# Patient Record
Sex: Male | Born: 1950 | Race: White | Hispanic: No | Smoking: Former smoker
Health system: Southern US, Community
[De-identification: ages and names within clinical notes are randomized; demographics above are authoritative.]

## PROBLEM LIST (undated history)

## (undated) DIAGNOSIS — I10 Essential (primary) hypertension: Secondary | ICD-10-CM

## (undated) DIAGNOSIS — C679 Malignant neoplasm of bladder, unspecified: Secondary | ICD-10-CM

## (undated) DIAGNOSIS — N329 Bladder disorder, unspecified: Secondary | ICD-10-CM

## (undated) DIAGNOSIS — Z87442 Personal history of urinary calculi: Secondary | ICD-10-CM

## (undated) DIAGNOSIS — I639 Cerebral infarction, unspecified: Secondary | ICD-10-CM

## (undated) DIAGNOSIS — R300 Dysuria: Secondary | ICD-10-CM

## (undated) DIAGNOSIS — J189 Pneumonia, unspecified organism: Secondary | ICD-10-CM

## (undated) DIAGNOSIS — Z9889 Other specified postprocedural states: Secondary | ICD-10-CM

## (undated) DIAGNOSIS — Z973 Presence of spectacles and contact lenses: Secondary | ICD-10-CM

## (undated) DIAGNOSIS — M1990 Unspecified osteoarthritis, unspecified site: Secondary | ICD-10-CM

## (undated) DIAGNOSIS — R112 Nausea with vomiting, unspecified: Secondary | ICD-10-CM

## (undated) HISTORY — PX: OTHER SURGICAL HISTORY: SHX169

## (undated) HISTORY — PX: 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: SHX5627

---
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
## 2006-03-25 ENCOUNTER — Ambulatory Visit (HOSPITAL_BASED_OUTPATIENT_CLINIC_OR_DEPARTMENT_OTHER): Admission: RE | Admit: 2006-03-25 | Discharge: 2006-03-25 | Payer: Self-pay | Admitting: Orthopedic Surgery

## 2006-07-07 ENCOUNTER — Ambulatory Visit (HOSPITAL_BASED_OUTPATIENT_CLINIC_OR_DEPARTMENT_OTHER): Admission: RE | Admit: 2006-07-07 | Discharge: 2006-07-07 | Payer: Self-pay | Admitting: Orthopedic Surgery

## 2007-10-25 HISTORY — PX: COLONOSCOPY: SHX174

## 2010-02-10 DIAGNOSIS — Z87442 Personal history of urinary calculi: Secondary | ICD-10-CM

## 2010-02-10 HISTORY — DX: Personal history of urinary calculi: Z87.442

## 2010-06-25 NOTE — Op Note (Signed)
NAME:  Shaun Ball, Shaun Ball NO.:  [id number]   MEDICAL RECORD NO.:  [id number]          PATIENT TYPE:  AMB   LOCATION:  DSC                          FACILITY:  [facility]   PHYSICIAN:  Leonides Grills, M.D.     DATE OF BIRTH:  10-10-1950   DATE OF PROCEDURE:  07/07/2006  DATE OF DISCHARGE:                               OPERATIVE REPORT   PREOPERATIVE DIAGNOSES:  1. Right Haglund deformity.  2. Right tight gastroc.  3. Right Achilles tendon rupture.   POSTOPERATIVE DIAGNOSES:  1. Right Haglund deformity.  2. Right tight gastroc.  3. Right Achilles tendon rupture.   OPERATION:  1. Excision right Haglund deformity.  2. Right gastroc slide.  3. Right primary repair Achilles tendon.   ANESTHESIA:  General.   SURGEON:  Leonides Grills, MD   ASSISTANT:  Evlyn Kanner, P.A.   ESTIMATED BLOOD LOSS:  Minimal.   TOURNIQUET TIME:  Approximately an hour 20 minutes.   COMPLICATIONS:  None.   DISPOSITION:  Stable to PR.   INDICATIONS:  This is 60 year old male who has had prolonged posterior  right heel pain that is interfering with his life to point that he  cannot do what he wants to do.  He was consented to the above procedure.  All risks which include infection, nerve or vessel injury, Achilles  tendon rupture, persistent pain, worsening pain, prolonged recovery,  weakness, stiffness were all explained.  Questions were encouraged and  answered.   Patient brought to operating room placed in supine position, after  adequate general endotracheal tube anesthesia was administered as well  as Ancef gram IV piggyback.  The patient was then placed in a prone  position.  All bony prominences well padded bolsters chest bolsters were  placed. The right lower extremity is prepped and draped in a sterile  manner over proximally placed thigh tourniquet.  Limb was gravity  exsanguinated.  Tourniquet elevated to 290 mmHg.  A longitudinal  incision on the medial aspect gastrocnemius  muscle tendinous junction  was then made.  Dissection was carried down through skin.  Hemostasis  was obtained.  Fascia was opened line with the incision, conjoined  region was then developed between the gastroc and soleus.  Soft tissue  was elevated off the posterior aspect gastrocnemius. Gastrocnemius was  then isolated, sural nerve was protected posteriorly and the  gastrocnemius then released with a curved Mayo scissors.  This had an  excellent release tight gastroc.  Area was copiously normal saline.  Subcu was closed 3-0 Vicryl.  Skin was closed 4-0 Monocryl subcuticular  stitch.  Steri-Strips were applied and we made an incision on the  anteromedial, anterolateral aspects of Achilles tendon.  Dissection was  carried down directly to bone.  Soft tissue was elevated off the  calcaneal tuber and the retrocalcaneal area. Retrocalcaneal bursa was  removed.  The calcification within the Achilles tendon was carefully  dissected out sharply and with a sagittal saw both the calcification  within the Achilles tendon as well as the Haglund deformity were  removed.  Because of the extent of the degeneration  of the Achilles  tendon and the fact that the calcification of Achilles tendon caused a  degeneration of the insertion Achilles tendon a primary repair the  Achilles tendon was performed as well.  We used to 5.5 mm absorbable  suture anchors with #2 FiberWire. These were placed at the base of the  Haglund resection and the Achilles was repaired to both suture anchors.  We then used a rotator cuff type repair technique crisscrossing the  suture anchors and then fixing this with a sure lock knotless suture  technique.  This was done through small incision Achilles tendon on  either side and two Sure locks were used, this had outstanding repair.  Prior to this Achilles tendon and area were copiously irrigated with  normal saline.  The ankle was ranged and there was excellent repair of  the  Achilles tendon and excellent tension on Achilles tendon as well.  We then repaired the outer edges of the Achilles tendon with 2-0 Vicryl.  There is no palpable stitches through skin and the prominence posterior  was completely decompressed.  Tourniquet deflated.  Hemostasis was  obtained.  Skin was closed 4-0 nylon stitch.  Sterile dressing was  applied.  Modified Jones dressing was applied with the ankle in gravity  equinus.  Patient stable to PR.   POSTOP COURSE:  The patient follow-up in 2 weeks.  At that time remove  the Jones dressing as well as suture.  Will placement short-leg gravity  equinus nonweightbearing cast.  He will wear for 4 weeks.  At 6 weeks  postop he will follow up. Will remove the cast and place him in a Cam  walker boot with a 9/16 inch heel lift.  He will then undergo  progressive weightbearing program in a Cam walker boot full  weightbearing and start therapy for active passive range of motion  excise the ankle talar joint with Thera-Band strengthening as well.  At  10 weeks postop he will go into a normal shoe with the 9/16 inch heel  lift which he will wear for 1-2 months.  He will undergo more aggressive  proprioception strengthening at that time.  Elevation active range of  motion toes encouraged.      Leonides Grills, M.D.  Electronically Signed     PB/MEDQ  D:  07/07/2006  T:  07/07/2006  Job:  [id number]

## 2010-06-28 NOTE — Op Note (Signed)
NAME:  VENSON, Shaun Ball NO.:  [id number]   MEDICAL RECORD NO.:  [id number]          PATIENT TYPE:  AMB   LOCATION:  DSC                          FACILITY:  [facility]   PHYSICIAN:  Leonides Grills, M.D.     DATE OF BIRTH:  [birth year]   DATE OF PROCEDURE:  03/25/2006  DATE OF DISCHARGE:                               OPERATIVE REPORT   PREOPERATIVE DIAGNOSES:  1. Left Haglund's deformity.  2. Left calcification Achilles tendon.  3. Left tight gastroc.   POSTOPERATIVE DIAGNOSES:  1. Left Haglund's deformity.  2. Left calcification Achilles tendon.  3. Left tight gastroc.   OPERATION:  1. Left excision Haglund's deformity.  2. Left gastroc slide.  3. Left excision of calcification within the Achilles tendon.  4. Left primary repair Achilles tendon.   ANESTHESIA:  General.   SURGEON:  Leonides Grills, M.D.   ASSISTANT:  Evlyn Kanner, P.A.-C.   ESTIMATED BLOOD LOSS:  Minimal.   TOURNIQUET TIME:  Approximately one hour.   COMPLICATIONS:  None.   DISPOSITION:  Stable to PR.   INDICATIONS:  This is a 60 year old gentleman who has a longstanding  posterior left heel pain that was interfering with his life.  He could  not do what he wanted to do.  He was consented for the above procedure.  All risks which include infection, nerve or vessel injury, persistent  pain, worst pain, Achilles tendon rupture, prolonged recovery were all  explained.  Questions were encouraged and answered.   DESCRIPTION OF PROCEDURE:  The patient brought to the operating room and  placed in the supine position.  Initially after adequate general G tube  anesthesia was administered as well as Ancef per IV piggyback.  The  patient was then placed in a prone position.  All bony prominences and  chest well padded.  The left lower extremity was then prepped and draped  in a sterile manner over a proximally placed thigh tourniquet.  We  started the procedure with a longitudinal incision over the  medial  aspect of the gastrocnemius muscle tendinous junction.  Dissection was  carried down through skin.  Hemostasis was obtained.  Fascia was opened  in line with the incision.  Conjoined region was then developed between  the gastroc and soleus musculature.  The sural nerve was identified and  elevated off the posterior aspect of the gastrocnemius and protected  throughout the case.  Gastrocnemius then released with a curved Mayo  scissors.  This had an excellent release of the tight gastroc.  The area  was copiously irrigated with normal saline.  Subcu was closed with 4-0  Monocryl subcuticular stitch.  Steri-Strips were applied.  We then  gravity exsanguinated the left lower extremity and tourniquet was  elevated 290 mmHg.  A longitudinal incision on either side of the  Achilles tendon at the anterior border both medially and laterally was  then made.  Dissection was carried down through skin.  Hemostasis was  obtained.  Dissection was carried on the anterior aspect of the Achilles  tendon and Achilles tendon was dissected off the large  bony prominence  posterolaterally.  This extended completely across the Achilles  insertional area.  This was then removed with a sagittal saw as well as  a rongeur and curved corners osteotome.  Once this was completely  debrided on either side of the joint, Haglund's deformity was then  removed with the sagittal saw as well.  Once this was done, the area was  copiously irrigated with normal saline.  Because of the Achilles tendon  tendinopathy, the Achilles tendon was then repaired back down to the  exposed bone with two 5.5 mm [employer] bioabsorbable suture anchors with  #2 FiberWire.  Once this was done, we then used a crisscross rotator  cuff repair suture technique which the sutures were passed subcu on the  posterior aspect of the Achilles.  We then placed two SureLock suture  anchors with a knotless technique.  This had an outstanding not  only  purchase through the drill hole with percutaneous incision in the  Achilles tendon, but also compressed the Achilles tendon down  beautifully as well.  Sutures were removed.  Tourniquet was deflated.  Hemostasis was obtained.  The area was copiously irrigated with normal  saline.  Skin was closed 4-0 nylon stitch.  Sterile dressing was  applied.  Modified Jones dressing was applied in gravity equinus.  Patient stable to PR.      Leonides Grills, M.D.  Electronically Signed     PB/MEDQ  D:  03/25/2006  T:  03/25/2006  Job:  [id number]

## 2014-07-11 DIAGNOSIS — H40013 Open angle with borderline findings, low risk, bilateral: Secondary | ICD-10-CM | POA: Diagnosis not present

## 2014-07-11 DIAGNOSIS — H3589 Other specified retinal disorders: Secondary | ICD-10-CM | POA: Diagnosis not present

## 2014-07-11 DIAGNOSIS — H11153 Pinguecula, bilateral: Secondary | ICD-10-CM | POA: Diagnosis not present

## 2014-07-11 DIAGNOSIS — H40053 Ocular hypertension, bilateral: Secondary | ICD-10-CM | POA: Diagnosis not present

## 2014-07-11 DIAGNOSIS — H18413 Arcus senilis, bilateral: Secondary | ICD-10-CM | POA: Diagnosis not present

## 2014-07-11 DIAGNOSIS — H2513 Age-related nuclear cataract, bilateral: Secondary | ICD-10-CM | POA: Diagnosis not present

## 2014-07-11 DIAGNOSIS — H40023 Open angle with borderline findings, high risk, bilateral: Secondary | ICD-10-CM | POA: Diagnosis not present

## 2014-07-11 DIAGNOSIS — H1045 Other chronic allergic conjunctivitis: Secondary | ICD-10-CM | POA: Diagnosis not present

## 2014-07-18 DIAGNOSIS — I10 Essential (primary) hypertension: Secondary | ICD-10-CM | POA: Diagnosis not present

## 2014-07-18 DIAGNOSIS — Z125 Encounter for screening for malignant neoplasm of prostate: Secondary | ICD-10-CM | POA: Diagnosis not present

## 2014-07-18 DIAGNOSIS — E785 Hyperlipidemia, unspecified: Secondary | ICD-10-CM | POA: Diagnosis not present

## 2014-07-25 DIAGNOSIS — H40013 Open angle with borderline findings, low risk, bilateral: Secondary | ICD-10-CM | POA: Diagnosis not present

## 2014-07-25 DIAGNOSIS — H40003 Preglaucoma, unspecified, bilateral: Secondary | ICD-10-CM | POA: Diagnosis not present

## 2014-07-25 DIAGNOSIS — H1045 Other chronic allergic conjunctivitis: Secondary | ICD-10-CM | POA: Diagnosis not present

## 2014-07-25 DIAGNOSIS — H18413 Arcus senilis, bilateral: Secondary | ICD-10-CM | POA: Diagnosis not present

## 2014-07-25 DIAGNOSIS — H11153 Pinguecula, bilateral: Secondary | ICD-10-CM | POA: Diagnosis not present

## 2014-07-25 DIAGNOSIS — H2513 Age-related nuclear cataract, bilateral: Secondary | ICD-10-CM | POA: Diagnosis not present

## 2014-08-09 DIAGNOSIS — R740 Nonspecific elevation of levels of transaminase and lactic acid dehydrogenase [LDH]: Secondary | ICD-10-CM | POA: Diagnosis not present

## 2014-08-09 DIAGNOSIS — Z6826 Body mass index (BMI) 26.0-26.9, adult: Secondary | ICD-10-CM | POA: Diagnosis not present

## 2014-08-09 DIAGNOSIS — M545 Low back pain: Secondary | ICD-10-CM | POA: Diagnosis not present

## 2014-08-09 DIAGNOSIS — Z833 Family history of diabetes mellitus: Secondary | ICD-10-CM | POA: Diagnosis not present

## 2014-08-09 DIAGNOSIS — N401 Enlarged prostate with lower urinary tract symptoms: Secondary | ICD-10-CM | POA: Diagnosis not present

## 2014-08-09 DIAGNOSIS — E785 Hyperlipidemia, unspecified: Secondary | ICD-10-CM | POA: Diagnosis not present

## 2014-08-09 DIAGNOSIS — Z1389 Encounter for screening for other disorder: Secondary | ICD-10-CM | POA: Diagnosis not present

## 2014-08-09 DIAGNOSIS — N200 Calculus of kidney: Secondary | ICD-10-CM | POA: Diagnosis not present

## 2014-08-09 DIAGNOSIS — Z0000 Encounter for general adult medical examination without abnormal findings: Secondary | ICD-10-CM | POA: Diagnosis not present

## 2014-08-09 DIAGNOSIS — I10 Essential (primary) hypertension: Secondary | ICD-10-CM | POA: Diagnosis not present

## 2014-08-10 DIAGNOSIS — Z1212 Encounter for screening for malignant neoplasm of rectum: Secondary | ICD-10-CM | POA: Diagnosis not present

## 2014-09-28 DIAGNOSIS — M545 Low back pain: Secondary | ICD-10-CM | POA: Diagnosis not present

## 2014-09-28 DIAGNOSIS — I10 Essential (primary) hypertension: Secondary | ICD-10-CM | POA: Diagnosis not present

## 2014-09-28 DIAGNOSIS — Z6826 Body mass index (BMI) 26.0-26.9, adult: Secondary | ICD-10-CM | POA: Diagnosis not present

## 2014-11-24 DIAGNOSIS — Z23 Encounter for immunization: Secondary | ICD-10-CM | POA: Diagnosis not present

## 2015-06-29 DIAGNOSIS — H11153 Pinguecula, bilateral: Secondary | ICD-10-CM | POA: Diagnosis not present

## 2015-06-29 DIAGNOSIS — H35411 Lattice degeneration of retina, right eye: Secondary | ICD-10-CM | POA: Diagnosis not present

## 2015-06-29 DIAGNOSIS — H5202 Hypermetropia, left eye: Secondary | ICD-10-CM | POA: Diagnosis not present

## 2015-06-29 DIAGNOSIS — H18413 Arcus senilis, bilateral: Secondary | ICD-10-CM | POA: Diagnosis not present

## 2015-06-29 DIAGNOSIS — H25013 Cortical age-related cataract, bilateral: Secondary | ICD-10-CM | POA: Diagnosis not present

## 2015-06-29 DIAGNOSIS — H11423 Conjunctival edema, bilateral: Secondary | ICD-10-CM | POA: Diagnosis not present

## 2015-06-29 DIAGNOSIS — H2513 Age-related nuclear cataract, bilateral: Secondary | ICD-10-CM | POA: Diagnosis not present

## 2015-06-29 DIAGNOSIS — H40013 Open angle with borderline findings, low risk, bilateral: Secondary | ICD-10-CM | POA: Diagnosis not present

## 2015-06-29 DIAGNOSIS — H35033 Hypertensive retinopathy, bilateral: Secondary | ICD-10-CM | POA: Diagnosis not present

## 2015-06-29 DIAGNOSIS — H25043 Posterior subcapsular polar age-related cataract, bilateral: Secondary | ICD-10-CM | POA: Diagnosis not present

## 2015-06-29 DIAGNOSIS — H40003 Preglaucoma, unspecified, bilateral: Secondary | ICD-10-CM | POA: Diagnosis not present

## 2015-06-29 DIAGNOSIS — H35431 Paving stone degeneration of retina, right eye: Secondary | ICD-10-CM | POA: Diagnosis not present

## 2015-08-28 DIAGNOSIS — E784 Other hyperlipidemia: Secondary | ICD-10-CM | POA: Diagnosis not present

## 2015-08-28 DIAGNOSIS — Z125 Encounter for screening for malignant neoplasm of prostate: Secondary | ICD-10-CM | POA: Diagnosis not present

## 2015-08-28 DIAGNOSIS — I10 Essential (primary) hypertension: Secondary | ICD-10-CM | POA: Diagnosis not present

## 2015-09-03 DIAGNOSIS — Z0000 Encounter for general adult medical examination without abnormal findings: Secondary | ICD-10-CM | POA: Diagnosis not present

## 2015-09-03 DIAGNOSIS — I10 Essential (primary) hypertension: Secondary | ICD-10-CM | POA: Diagnosis not present

## 2015-09-03 DIAGNOSIS — N200 Calculus of kidney: Secondary | ICD-10-CM | POA: Diagnosis not present

## 2015-09-03 DIAGNOSIS — M545 Low back pain: Secondary | ICD-10-CM | POA: Diagnosis not present

## 2015-09-03 DIAGNOSIS — Z1389 Encounter for screening for other disorder: Secondary | ICD-10-CM | POA: Diagnosis not present

## 2015-09-03 DIAGNOSIS — N401 Enlarged prostate with lower urinary tract symptoms: Secondary | ICD-10-CM | POA: Diagnosis not present

## 2015-09-03 DIAGNOSIS — E784 Other hyperlipidemia: Secondary | ICD-10-CM | POA: Diagnosis not present

## 2015-09-03 DIAGNOSIS — Z6827 Body mass index (BMI) 27.0-27.9, adult: Secondary | ICD-10-CM | POA: Diagnosis not present

## 2015-09-07 DIAGNOSIS — Z1212 Encounter for screening for malignant neoplasm of rectum: Secondary | ICD-10-CM | POA: Diagnosis not present

## 2015-11-06 DIAGNOSIS — Z23 Encounter for immunization: Secondary | ICD-10-CM | POA: Diagnosis not present

## 2015-12-06 DIAGNOSIS — J0100 Acute maxillary sinusitis, unspecified: Secondary | ICD-10-CM | POA: Diagnosis not present

## 2016-07-02 DIAGNOSIS — H04123 Dry eye syndrome of bilateral lacrimal glands: Secondary | ICD-10-CM | POA: Diagnosis not present

## 2016-07-02 DIAGNOSIS — H2513 Age-related nuclear cataract, bilateral: Secondary | ICD-10-CM | POA: Diagnosis not present

## 2016-07-02 DIAGNOSIS — H40013 Open angle with borderline findings, low risk, bilateral: Secondary | ICD-10-CM | POA: Diagnosis not present

## 2016-07-02 DIAGNOSIS — H25042 Posterior subcapsular polar age-related cataract, left eye: Secondary | ICD-10-CM | POA: Diagnosis not present

## 2016-07-02 DIAGNOSIS — H25013 Cortical age-related cataract, bilateral: Secondary | ICD-10-CM | POA: Diagnosis not present

## 2016-07-02 DIAGNOSIS — H35431 Paving stone degeneration of retina, right eye: Secondary | ICD-10-CM | POA: Diagnosis not present

## 2016-07-02 DIAGNOSIS — H353131 Nonexudative age-related macular degeneration, bilateral, early dry stage: Secondary | ICD-10-CM | POA: Diagnosis not present

## 2016-07-31 DIAGNOSIS — J0100 Acute maxillary sinusitis, unspecified: Secondary | ICD-10-CM | POA: Diagnosis not present

## 2016-08-09 DIAGNOSIS — J029 Acute pharyngitis, unspecified: Secondary | ICD-10-CM | POA: Diagnosis not present

## 2016-08-27 DIAGNOSIS — E784 Other hyperlipidemia: Secondary | ICD-10-CM | POA: Diagnosis not present

## 2016-08-27 DIAGNOSIS — R358 Other polyuria: Secondary | ICD-10-CM | POA: Diagnosis not present

## 2016-08-27 DIAGNOSIS — R8299 Other abnormal findings in urine: Secondary | ICD-10-CM | POA: Diagnosis not present

## 2016-08-27 DIAGNOSIS — I10 Essential (primary) hypertension: Secondary | ICD-10-CM | POA: Diagnosis not present

## 2016-08-27 DIAGNOSIS — Z125 Encounter for screening for malignant neoplasm of prostate: Secondary | ICD-10-CM | POA: Diagnosis not present

## 2016-09-03 DIAGNOSIS — E784 Other hyperlipidemia: Secondary | ICD-10-CM | POA: Diagnosis not present

## 2016-09-03 DIAGNOSIS — Z6826 Body mass index (BMI) 26.0-26.9, adult: Secondary | ICD-10-CM | POA: Diagnosis not present

## 2016-09-03 DIAGNOSIS — Z1212 Encounter for screening for malignant neoplasm of rectum: Secondary | ICD-10-CM | POA: Diagnosis not present

## 2016-09-03 DIAGNOSIS — I10 Essential (primary) hypertension: Secondary | ICD-10-CM | POA: Diagnosis not present

## 2016-09-03 DIAGNOSIS — J019 Acute sinusitis, unspecified: Secondary | ICD-10-CM | POA: Diagnosis not present

## 2016-09-03 DIAGNOSIS — K635 Polyp of colon: Secondary | ICD-10-CM | POA: Diagnosis not present

## 2016-09-03 DIAGNOSIS — N200 Calculus of kidney: Secondary | ICD-10-CM | POA: Diagnosis not present

## 2016-09-03 DIAGNOSIS — Z1389 Encounter for screening for other disorder: Secondary | ICD-10-CM | POA: Diagnosis not present

## 2016-09-03 DIAGNOSIS — R740 Nonspecific elevation of levels of transaminase and lactic acid dehydrogenase [LDH]: Secondary | ICD-10-CM | POA: Diagnosis not present

## 2016-09-03 DIAGNOSIS — Z0000 Encounter for general adult medical examination without abnormal findings: Secondary | ICD-10-CM | POA: Diagnosis not present

## 2016-09-03 DIAGNOSIS — Z833 Family history of diabetes mellitus: Secondary | ICD-10-CM | POA: Diagnosis not present

## 2016-09-03 DIAGNOSIS — N390 Urinary tract infection, site not specified: Secondary | ICD-10-CM | POA: Diagnosis not present

## 2016-09-03 DIAGNOSIS — N401 Enlarged prostate with lower urinary tract symptoms: Secondary | ICD-10-CM | POA: Diagnosis not present

## 2016-09-16 ENCOUNTER — Other Ambulatory Visit: Payer: Self-pay | Admitting: Endocrinology

## 2016-09-16 DIAGNOSIS — N63 Unspecified lump in unspecified breast: Secondary | ICD-10-CM

## 2016-09-16 DIAGNOSIS — R590 Localized enlarged lymph nodes: Secondary | ICD-10-CM | POA: Diagnosis not present

## 2016-09-16 DIAGNOSIS — N390 Urinary tract infection, site not specified: Secondary | ICD-10-CM | POA: Diagnosis not present

## 2016-09-18 ENCOUNTER — Ambulatory Visit
Admission: RE | Admit: 2016-09-18 | Discharge: 2016-09-18 | Disposition: A | Discharge status: Home / Self Care | Payer: Medicare Other | Source: Ambulatory Visit | Attending: Endocrinology | Admitting: Endocrinology

## 2016-09-18 ENCOUNTER — Other Ambulatory Visit: Payer: Self-pay | Admitting: Endocrinology

## 2016-09-18 DIAGNOSIS — N6489 Other specified disorders of breast: Secondary | ICD-10-CM | POA: Diagnosis not present

## 2016-09-18 DIAGNOSIS — N63 Unspecified lump in unspecified breast: Secondary | ICD-10-CM

## 2016-09-18 DIAGNOSIS — R928 Other abnormal and inconclusive findings on diagnostic imaging of breast: Secondary | ICD-10-CM | POA: Diagnosis not present

## 2016-09-23 DIAGNOSIS — N390 Urinary tract infection, site not specified: Secondary | ICD-10-CM | POA: Diagnosis not present

## 2016-09-23 DIAGNOSIS — R351 Nocturia: Secondary | ICD-10-CM | POA: Diagnosis not present

## 2016-09-23 DIAGNOSIS — R3121 Asymptomatic microscopic hematuria: Secondary | ICD-10-CM | POA: Diagnosis not present

## 2016-09-23 DIAGNOSIS — R3129 Other microscopic hematuria: Secondary | ICD-10-CM | POA: Diagnosis not present

## 2016-10-01 DIAGNOSIS — R3121 Asymptomatic microscopic hematuria: Secondary | ICD-10-CM | POA: Diagnosis not present

## 2016-10-07 DIAGNOSIS — R351 Nocturia: Secondary | ICD-10-CM | POA: Diagnosis not present

## 2016-10-07 DIAGNOSIS — R3121 Asymptomatic microscopic hematuria: Secondary | ICD-10-CM | POA: Diagnosis not present

## 2016-10-14 ENCOUNTER — Other Ambulatory Visit: Payer: Self-pay | Admitting: Urology

## 2016-10-14 DIAGNOSIS — R3121 Asymptomatic microscopic hematuria: Secondary | ICD-10-CM | POA: Diagnosis not present

## 2016-10-14 DIAGNOSIS — N281 Cyst of kidney, acquired: Secondary | ICD-10-CM | POA: Diagnosis not present

## 2016-10-14 DIAGNOSIS — N3000 Acute cystitis without hematuria: Secondary | ICD-10-CM | POA: Diagnosis not present

## 2016-10-20 ENCOUNTER — Encounter (HOSPITAL_BASED_OUTPATIENT_CLINIC_OR_DEPARTMENT_OTHER): Payer: Self-pay | Admitting: *Deleted

## 2016-10-21 ENCOUNTER — Encounter (HOSPITAL_BASED_OUTPATIENT_CLINIC_OR_DEPARTMENT_OTHER): Payer: Self-pay | Admitting: *Deleted

## 2016-10-21 NOTE — Progress Notes (Signed)
To [facility] at 0945-Istat, Ekg on arrival-Npo after Mn.

## 2016-10-23 NOTE — H&P (Signed)
Urology Preoperative H&P   Chief Complaint: Bladder lesion  History of Present Illness: DERVIN VORE is a 66 y.o. male with a history of recurrent cystitis, nephrolithiasis and microscopic hematuria.  He underwent a cystoscopy in August 2018 with Dr. Matilde Sprang and was found to have a suspicious and erythematous lesion involving the trigone of the bladder.    Past Medical History:  Diagnosis Date  . History of kidney stones 2012   passed / has renal cyst to evaluated at later time  . Hypertension   . Lesion of bladder   . Pneumonia 2012    Past Surgical History:  Procedure Laterality Date  . 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 left 03-25-2006;  right 07-07-2006   and Gastroc Slide    Allergies:  Allergies  Allergen Reactions  . Statins Hives    Rash,whelps,liver enzymes elevated    History reviewed. No pertinent family history.  Social History:  reports that he quit smoking about 17 years ago. His smoking use included Cigarettes. He has never used smokeless tobacco. He reports that he drinks about 4.2 oz of alcohol per week . He reports that he does not use drugs.  ROS: A complete review of systems was performed.  All systems are negative except for pertinent findings as noted.  Physical Exam:  Vital signs in last 24 hours:   Constitutional:  Alert and oriented, No acute distress Cardiovascular: Regular rate and rhythm, No JVD Respiratory: Normal respiratory effort, Lungs clear bilaterally GI: Abdomen is soft, nontender, nondistended, no abdominal masses GU: No CVA tenderness Lymphatic: No lymphadenopathy Neurologic: Grossly intact, no focal deficits Psychiatric: Normal mood and affect  Laboratory Data:  No results for input(s): WBC, HGB, HCT, PLT in the last 72 hours.  No results for input(s): NA, K, CL, GLUCOSE, BUN, CALCIUM, CREATININE in the last 72 hours.  Invalid input(s): CO3   No results found for this or any previous  visit (from the past 24 hour(s)). No results found for this or any previous visit (from the past 240 hour(s)).  Renal Function: No results for input(s): CREATININE in the last 168 hours. CrCl cannot be calculated (No order found.).  Radiologic Imaging: No results found.  I independently reviewed the above imaging studies.  Assessment and Plan BRAIDEN RODMAN is a 66 y.o. male with an indeterminate lesion involving the trigone of the bladder, suspicious for malignancy.  He has been consented for cystoscopy with bladder biopsy.  Risks, benefits and alternatives discussed with the patient.  He voices understanding and wishes to proceed.   [name], MD 10/23/2016, 1:31 PM  [facility] Pager: [phone]

## 2016-10-24 ENCOUNTER — Ambulatory Visit (HOSPITAL_BASED_OUTPATIENT_CLINIC_OR_DEPARTMENT_OTHER): Payer: Medicare Other | Admitting: Anesthesiology

## 2016-10-24 ENCOUNTER — Encounter (HOSPITAL_BASED_OUTPATIENT_CLINIC_OR_DEPARTMENT_OTHER): Payer: Self-pay | Admitting: Anesthesiology

## 2016-10-24 ENCOUNTER — Ambulatory Visit (HOSPITAL_BASED_OUTPATIENT_CLINIC_OR_DEPARTMENT_OTHER)
Admission: RE | Admit: 2016-10-24 | Discharge: 2016-10-24 | Disposition: A | Discharge status: Home / Self Care | Payer: Medicare Other | Source: Ambulatory Visit | Attending: Urology | Admitting: Urology

## 2016-10-24 ENCOUNTER — Encounter (HOSPITAL_BASED_OUTPATIENT_CLINIC_OR_DEPARTMENT_OTHER)
Admission: RE | Disposition: A | Discharge status: Home / Self Care | Payer: Self-pay | Source: Ambulatory Visit | Attending: Urology

## 2016-10-24 ENCOUNTER — Other Ambulatory Visit: Payer: Self-pay

## 2016-10-24 DIAGNOSIS — Z87891 Personal history of nicotine dependence: Secondary | ICD-10-CM | POA: Insufficient documentation

## 2016-10-24 DIAGNOSIS — Z791 Long term (current) use of non-steroidal anti-inflammatories (NSAID): Secondary | ICD-10-CM | POA: Insufficient documentation

## 2016-10-24 DIAGNOSIS — I10 Essential (primary) hypertension: Secondary | ICD-10-CM | POA: Insufficient documentation

## 2016-10-24 DIAGNOSIS — N3289 Other specified disorders of bladder: Secondary | ICD-10-CM | POA: Insufficient documentation

## 2016-10-24 DIAGNOSIS — D090 Carcinoma in situ of bladder: Secondary | ICD-10-CM | POA: Diagnosis not present

## 2016-10-24 DIAGNOSIS — N138 Other obstructive and reflux uropathy: Secondary | ICD-10-CM | POA: Diagnosis not present

## 2016-10-24 DIAGNOSIS — R3129 Other microscopic hematuria: Secondary | ICD-10-CM | POA: Diagnosis not present

## 2016-10-24 DIAGNOSIS — N323 Diverticulum of bladder: Secondary | ICD-10-CM | POA: Insufficient documentation

## 2016-10-24 DIAGNOSIS — Z87442 Personal history of urinary calculi: Secondary | ICD-10-CM | POA: Insufficient documentation

## 2016-10-24 DIAGNOSIS — D494 Neoplasm of unspecified behavior of bladder: Secondary | ICD-10-CM | POA: Diagnosis not present

## 2016-10-24 DIAGNOSIS — C674 Malignant neoplasm of posterior wall of bladder: Secondary | ICD-10-CM | POA: Diagnosis not present

## 2016-10-24 DIAGNOSIS — Z79899 Other long term (current) drug therapy: Secondary | ICD-10-CM | POA: Insufficient documentation

## 2016-10-24 DIAGNOSIS — C671 Malignant neoplasm of dome of bladder: Secondary | ICD-10-CM | POA: Insufficient documentation

## 2016-10-24 DIAGNOSIS — Z7951 Long term (current) use of inhaled steroids: Secondary | ICD-10-CM | POA: Insufficient documentation

## 2016-10-24 HISTORY — PX: CYSTOSCOPY WITH BIOPSY: SHX5122

## 2016-10-24 HISTORY — DX: Other specified postprocedural states: Z98.890

## 2016-10-24 HISTORY — DX: Nausea with vomiting, unspecified: R11.2

## 2016-10-24 HISTORY — DX: Bladder disorder, unspecified: N32.9

## 2016-10-24 HISTORY — DX: Personal history of urinary calculi: Z87.442

## 2016-10-24 HISTORY — DX: Pneumonia, unspecified organism: J18.9

## 2016-10-24 HISTORY — DX: Essential (primary) hypertension: I10

## 2016-10-24 LAB — POCT I-STAT 4, (NA,K, GLUC, HGB,HCT)
Glucose, Bld: 95 mg/dL (ref 65–99)
HCT: 49 % (ref 39.0–52.0)
Hemoglobin: 16.7 g/dL (ref 13.0–17.0)
POTASSIUM: 4 mmol/L (ref 3.5–5.1)
SODIUM: 140 mmol/L (ref 135–145)

## 2016-10-24 SURGERY — CYSTOSCOPY, WITH BIOPSY
Anesthesia: General | Site: Renal | Laterality: Bilateral

## 2016-10-24 MED ORDER — LIDOCAINE 2% (20 MG/ML) 5 ML SYRINGE
INTRAMUSCULAR | Status: AC
  Filled 2016-10-24: qty 5

## 2016-10-24 MED ORDER — FENTANYL CITRATE (PF) 100 MCG/2ML IJ SOLN
25.0000 ug | INTRAMUSCULAR | Status: DC | PRN
  Filled 2016-10-24: qty 1

## 2016-10-24 MED ORDER — TRAMADOL HCL 50 MG PO TABS
50.0000 mg | ORAL_TABLET | Freq: Once | ORAL | Status: AC
  Administered 2016-10-24: 50 mg via ORAL
  Filled 2016-10-24: qty 1

## 2016-10-24 MED ORDER — ONDANSETRON HCL 4 MG/2ML IJ SOLN
INTRAMUSCULAR | Status: DC | PRN
  Administered 2016-10-24: 4 mg via INTRAVENOUS

## 2016-10-24 MED ORDER — FENTANYL CITRATE (PF) 100 MCG/2ML IJ SOLN
INTRAMUSCULAR | Status: AC
  Filled 2016-10-24: qty 2

## 2016-10-24 MED ORDER — DEXAMETHASONE SODIUM PHOSPHATE 4 MG/ML IJ SOLN
INTRAMUSCULAR | Status: DC | PRN
  Administered 2016-10-24: 10 mg via INTRAVENOUS

## 2016-10-24 MED ORDER — PHENAZOPYRIDINE HCL 200 MG PO TABS
200.0000 mg | ORAL_TABLET | Freq: Once | ORAL | Status: AC
  Administered 2016-10-24: 200 mg via ORAL
  Filled 2016-10-24: qty 1

## 2016-10-24 MED ORDER — MIDAZOLAM HCL 5 MG/5ML IJ SOLN
INTRAMUSCULAR | Status: DC | PRN
  Administered 2016-10-24: 2 mg via INTRAVENOUS

## 2016-10-24 MED ORDER — IOHEXOL 300 MG/ML  SOLN
INTRAMUSCULAR | Status: DC | PRN
  Administered 2016-10-24: 8 mL

## 2016-10-24 MED ORDER — PROPOFOL 10 MG/ML IV BOLUS
INTRAVENOUS | Status: DC | PRN
  Administered 2016-10-24: 250 mg via INTRAVENOUS
  Administered 2016-10-24: 50 mg via INTRAVENOUS

## 2016-10-24 MED ORDER — STERILE WATER FOR IRRIGATION IR SOLN
Status: DC | PRN
  Administered 2016-10-24: 3000 mL

## 2016-10-24 MED ORDER — TRAMADOL HCL 50 MG PO TABS: 50.0000 mg | ORAL_TABLET | Freq: Four times a day (QID) | ORAL | 0 refills | Status: AC | PRN

## 2016-10-24 MED ORDER — LIDOCAINE HCL (CARDIAC) 20 MG/ML IV SOLN
INTRAVENOUS | Status: DC | PRN
  Administered 2016-10-24: 60 mg via INTRAVENOUS

## 2016-10-24 MED ORDER — PHENAZOPYRIDINE HCL 200 MG PO TABS: 200.0000 mg | ORAL_TABLET | Freq: Three times a day (TID) | ORAL | 0 refills | Status: AC | PRN

## 2016-10-24 MED ORDER — BELLADONNA ALKALOIDS-OPIUM 16.2-60 MG RE SUPP
RECTAL | Status: DC | PRN
Start: 2016-10-24 — End: 2016-10-24
  Administered 2016-10-24: 1 via RECTAL

## 2016-10-24 MED ORDER — CEFAZOLIN SODIUM-DEXTROSE 2-4 GM/100ML-% IV SOLN
2.0000 g | INTRAVENOUS | Status: AC
  Administered 2016-10-24: 2 g via INTRAVENOUS
  Filled 2016-10-24: qty 100

## 2016-10-24 MED ORDER — DEXAMETHASONE SODIUM PHOSPHATE 10 MG/ML IJ SOLN
INTRAMUSCULAR | Status: AC
  Filled 2016-10-24: qty 1

## 2016-10-24 MED ORDER — LACTATED RINGERS IV SOLN
INTRAVENOUS | Status: DC
  Administered 2016-10-24: 12:00:00 via INTRAVENOUS
  Administered 2016-10-24: 1000 mL via INTRAVENOUS
  Filled 2016-10-24: qty 1000

## 2016-10-24 MED ORDER — TAMSULOSIN HCL 0.4 MG PO CAPS: 0.4000 mg | ORAL_CAPSULE | Freq: Every day | ORAL | 11 refills | Status: AC

## 2016-10-24 MED ORDER — BELLADONNA ALKALOIDS-OPIUM 16.2-60 MG RE SUPP
RECTAL | Status: AC
  Filled 2016-10-24: qty 1

## 2016-10-24 MED ORDER — SULFAMETHOXAZOLE-TRIMETHOPRIM 800-160 MG PO TABS: 1.0000 | ORAL_TABLET | Freq: Two times a day (BID) | ORAL | 0 refills | Status: AC

## 2016-10-24 MED ORDER — OXYCODONE HCL 5 MG PO TABS
5.0000 mg | ORAL_TABLET | Freq: Once | ORAL | Status: DC | PRN
  Filled 2016-10-24: qty 1

## 2016-10-24 MED ORDER — MIDAZOLAM HCL 2 MG/2ML IJ SOLN
INTRAMUSCULAR | Status: AC
  Filled 2016-10-24: qty 2

## 2016-10-24 MED ORDER — PROMETHAZINE HCL 25 MG/ML IJ SOLN
6.2500 mg | INTRAMUSCULAR | Status: DC | PRN
  Filled 2016-10-24: qty 1

## 2016-10-24 MED ORDER — ONDANSETRON HCL 4 MG/2ML IJ SOLN
INTRAMUSCULAR | Status: AC
  Filled 2016-10-24: qty 2

## 2016-10-24 MED ORDER — DEXAMETHASONE SODIUM PHOSPHATE 10 MG/ML IJ SOLN
INTRAMUSCULAR | Status: AC
Start: 2016-10-24 — End: ?
  Filled 2016-10-24: qty 1

## 2016-10-24 MED ORDER — CEFAZOLIN SODIUM-DEXTROSE 2-4 GM/100ML-% IV SOLN
INTRAVENOUS | Status: AC
  Filled 2016-10-24: qty 100

## 2016-10-24 MED ORDER — KETOROLAC TROMETHAMINE 30 MG/ML IJ SOLN
INTRAMUSCULAR | Status: AC
  Filled 2016-10-24: qty 1

## 2016-10-24 MED ORDER — OXYCODONE HCL 5 MG/5ML PO SOLN
5.0000 mg | Freq: Once | ORAL | Status: DC | PRN
  Filled 2016-10-24: qty 5

## 2016-10-24 MED ORDER — PROPOFOL 500 MG/50ML IV EMUL
INTRAVENOUS | Status: AC
  Filled 2016-10-24: qty 50

## 2016-10-24 MED ORDER — FENTANYL CITRATE (PF) 100 MCG/2ML IJ SOLN
INTRAMUSCULAR | Status: DC | PRN
  Administered 2016-10-24 (×2): 50 ug via INTRAVENOUS

## 2016-10-24 MED ORDER — PHENAZOPYRIDINE HCL 100 MG PO TABS
ORAL_TABLET | ORAL | Status: AC
Start: 2016-10-24 — End: ?
  Filled 2016-10-24: qty 2

## 2016-10-24 MED ORDER — PROPOFOL 500 MG/50ML IV EMUL
INTRAVENOUS | Status: AC
  Filled 2016-10-24: qty 150

## 2016-10-24 MED ORDER — TRAMADOL HCL 50 MG PO TABS
ORAL_TABLET | ORAL | Status: AC
  Filled 2016-10-24: qty 1

## 2016-10-24 SURGICAL SUPPLY — 25 items
BAG DRAIN URO-CYSTO SKYTR STRL (DRAIN) ×3 IMPLANT
BAG DRN UROCATH (DRAIN) ×1
CATH INTERMIT  6FR 70CM (CATHETERS) ×2 IMPLANT
CATH ROBINSON RED A/P 14FR (CATHETERS) IMPLANT
CLOTH BEACON ORANGE TIMEOUT ST (SAFETY) ×3 IMPLANT
ELECT REM PT RETURN 9FT ADLT (ELECTROSURGICAL) ×3
ELECTRODE REM PT RTRN 9FT ADLT (ELECTROSURGICAL) ×1 IMPLANT
GLOVE BIO SURGEON STRL SZ7.5 (GLOVE) ×3 IMPLANT
GLOVE BIOGEL PI IND STRL 7.0 (GLOVE) IMPLANT
GLOVE BIOGEL PI IND STRL 7.5 (GLOVE) IMPLANT
GLOVE BIOGEL PI INDICATOR 7.0 (GLOVE) ×2
GLOVE BIOGEL PI INDICATOR 7.5 (GLOVE) ×4
GOWN STRL REUS W/ TWL LRG LVL3 (GOWN DISPOSABLE) ×3 IMPLANT
GOWN STRL REUS W/TWL LRG LVL3 (GOWN DISPOSABLE) ×4 IMPLANT
GOWN STRL REUS W/TWL XL LVL3 (GOWN DISPOSABLE) ×2 IMPLANT
GUIDEWIRE STR DUAL SENSOR (WIRE) ×2 IMPLANT
KIT RM TURNOVER CYSTO AR (KITS) ×3 IMPLANT
MANIFOLD NEPTUNE II (INSTRUMENTS) ×2 IMPLANT
NDL HYPO 18GX1.5 BLUNT FILL (NEEDLE) IMPLANT
NEEDLE HYPO 18GX1.5 BLUNT FILL (NEEDLE) IMPLANT
PACK CYSTO (CUSTOM PROCEDURE TRAY) ×3 IMPLANT
SYR 20CC LL (SYRINGE) IMPLANT
TUBE CONNECTING 12'X1/4 (SUCTIONS)
TUBE CONNECTING 12X1/4 (SUCTIONS) IMPLANT
WATER STERILE IRR 3000ML UROMA (IV SOLUTION) ×3 IMPLANT

## 2016-10-24 NOTE — Op Note (Signed)
Operative Note  Preoperative diagnosis:  1. Erythematous bladder lesion concerning for malignancy 2. Microscopic hematuria Postoperative diagnosis: 1. Erythematous appearing mucosa involving the majority of the right side of the bladder. 2. 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 3. Trabeculation of bladder detrusor musculature with signs of bladder outlet obstruction 4. Tortuous left distal ureter  Procedure(s): 1. Cystoscopy with bilateral retrograde pyelograms with intraoperative interpretation of fluoroscopic imaging 2. Bladder biopsy and fulguration  Surgeon: [name], MD  Anesthesia: Gen.  Complications: none  EBL: 5 mL  Specimens: 1. Right posterior bladder and bladder dome biopsies (3)  Drains/Catheters: 1.  none  Intraoperative findings: Normal bilateral retrograde pyelograms. Diffuse erythematous appearing bladder mucosa involving the majority of the right posterior bladder wall.  Trabeculation of the detrusor musculature. 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  No obvious papillary lesions involving the bladder mucosa.  Indication: Shaun Ball there is a 66 year old male who initially presented to the office with microscopic hematuria and lower urinary tract symptoms.  He had cross-sectional imaging performed that inserted bilateral renal cysts with a complex renal cyst involving the left kidney, but was otherwise unremarkable from a urologic standpoint. He had a cystoscopy performed in the office that revealed a erythematous lesion involving the posterior bladder wall, suspicious for malignancy.  Description of procedure:  After informed consent was obtained, the patient was brought to the operating room and general LMA anesthesia was administered. The patient was then placed in the dorsolithotomy position and prepped and  draped in usual sterile fashion. A timeout was performed. A 21 French rigid cystoscope was then inserted into the urethral meatus and advanced into the bladder under direct vision. A complete bladder survey revealed the bladder findings listed above.  A 6 [place] open-ended catheter was then used to intubate the right ureteral orifice and a retrograde pyelogram was obtained that showed no filling defects along the entire length of the right ureter and crisp outlining of all right renal calyces with no renal pelvis defects.The open-ended catheter was then inserted into the left ureteral orifice with the assistance of a Glidewire due to J hooking of his distal left ureter. A left retrograde pyelogram revealed no filling defects along the left ureter and crisp outlining of all left renal calyces with no renal pelvis defects.  Cold cup biopsies were then taken of the right posterior bladder wall as well as the bladder dome.  The biopsied areas of the bladder was then extensively fulgurated with electrocautery, until hemostasis is achieved. His bladder was drained and the cystoscope was removed. He tolerated the procedure well and was transferred to the postanesthesia unit in stable condition.  Plan:  Follow-up in 2 weeks to discuss pathology.  Will start tamsulosin postoperatively due to signs of bladder outlet obstruction seen on cystoscopy.

## 2016-10-24 NOTE — Discharge Instructions (Signed)
Expect some blood in the urine for the next several days.  If unable to urinate or if there is blood in the urine that is heavy/continuous, go to your nearest emergency department for evaluation.  CYSTOSCOPY HOME CARE INSTRUCTIONS  Activity: Rest for the remainder of the day.  Do not drive or operate equipment today.  You may resume normal activities in one to two days as instructed by your physician.   Meals: Drink plenty of liquids and eat light foods such as gelatin or soup this evening.  You may return to a normal meal plan tomorrow.  Return to Work: You may return to work in one to two days or as instructed by your physician.  Special Instructions / Symptoms: Call your physician if any of these symptoms occur:   -persistent or heavy bleeding  -bleeding which continues after first few urination  -large blood clots that are difficult to pass  -urine stream diminishes or stops completely  -fever equal to or higher than 101 degrees Farenheit.  -cloudy urine with a strong, foul odor  -severe pain  Females should always wipe from front to back after elimination.  You may feel some burning pain when you urinate.  This should disappear with time.  Applying moist heat to the lower abdomen or a hot tub bath may help relieve the pain. \  Follow-Up / Date of Return Visit to Your Physician:   Call for an appointment to arrange follow-up.   Post Anesthesia Home Care Instructions  Activity: Get plenty of rest for the remainder of the day. A responsible individual must stay with you for 24 hours following the procedure.  For the next 24 hours, DO NOT: -Drive a car -Paediatric nurse -Drink alcoholic beverages -Take any medication unless instructed by your physician -Make any legal decisions or sign important papers.  Meals: Start with liquid foods such as gelatin or soup. Progress to regular foods as tolerated. Avoid greasy, spicy, heavy foods. If nausea and/or vomiting occur, drink  only clear liquids until the nausea and/or vomiting subsides. Call your physician if vomiting continues.  Special Instructions/Symptoms: Your throat may feel dry or sore from the anesthesia or the breathing tube placed in your throat during surgery. If this causes discomfort, gargle with warm salt water. The discomfort should disappear within 24 hours.  If you had a scopolamine patch placed behind your ear for the management of post- operative nausea and/or vomiting:  1. The medication in the patch is effective for 72 hours, after which it should be removed.  Wrap patch in a tissue and discard in the trash. Wash hands thoroughly with soap and water. 2. You may remove the patch earlier than 72 hours if you experience unpleasant side effects which may include dry mouth, dizziness or visual disturbances. 3. Avoid touching the patch. Wash your hands with soap and water after contact with the patch.

## 2016-10-24 NOTE — Transfer of Care (Signed)
Immediate Anesthesia Transfer of Care Note  Patient: Shaun Ball  Procedure(s) Performed: Procedure(s): CYSTOSCOPY WITH BLADDER BIOPSY/ BILATERAL RETROGRADE PYLOGRAM (Bilateral)  Patient Location: PACU  Anesthesia Type:General  Level of Consciousness: awake, alert , oriented and patient cooperative  Airway & Oxygen Therapy: Patient Spontanous Breathing and Patient connected to nasal cannula oxygen  Post-op Assessment: Report given to RN and Post -op Vital signs reviewed and stable  Post vital signs: Reviewed and stable  Last Vitals:  Vitals:   10/24/16 0939  BP: 124/76  Pulse: 72  Resp: 18  Temp: 36.6 C  SpO2: 98%    Last Pain:  Vitals:   10/24/16 0939  TempSrc: Oral      Patients Stated Pain Goal: 6 (97/84/78 4128)  Complications: No apparent anesthesia complications

## 2016-10-24 NOTE — Anesthesia Preprocedure Evaluation (Addendum)
Anesthesia Evaluation  Patient identified by MRN, date of birth, ID band Patient awake    Reviewed: Allergy & Precautions, NPO status , Patient's Chart, lab work & pertinent test results  Airway Mallampati: II  TM Distance: >3 FB Neck ROM: Full    Dental no notable dental hx. (+) Teeth Intact, Dental Advisory Given, Caps,    Pulmonary neg pulmonary ROS, former smoker,    Pulmonary exam normal breath sounds clear to auscultation       Cardiovascular hypertension, Pt. on medications Normal cardiovascular exam Rhythm:Regular Rate:Normal     Neuro/Psych negative neurological ROS  negative psych ROS   GI/Hepatic negative GI ROS, Neg liver ROS,   Endo/Other  negative endocrine ROS  Renal/GU negative Renal ROS  negative genitourinary   Musculoskeletal negative musculoskeletal ROS (+)   Abdominal   Peds negative pediatric ROS (+)  Hematology negative hematology ROS (+)   Anesthesia Other Findings   Reproductive/Obstetrics negative OB ROS                            Anesthesia Physical Anesthesia Plan  ASA: II  Anesthesia Plan: General   Post-op Pain Management:    Induction: Intravenous  PONV Risk Score and Plan: 1 and Ondansetron and Dexamethasone  Airway Management Planned: LMA  Additional Equipment:   Intra-op Plan:   Post-operative Plan: Extubation in OR  Informed Consent: I have reviewed the patients History and Physical, chart, labs and discussed the procedure including the risks, benefits and alternatives for the proposed anesthesia with the patient or authorized representative who has indicated his/her understanding and acceptance.   Dental advisory given  Plan Discussed with: CRNA and Surgeon  Anesthesia Plan Comments:         Anesthesia Quick Evaluation

## 2016-10-24 NOTE — Anesthesia Procedure Notes (Signed)
Procedure Name: LMA Insertion Date/Time: 10/24/2016 11:24 AM Performed by: Wanita Chamberlain Pre-anesthesia Checklist: Patient identified, Timeout performed, Emergency Drugs available, Suction available and Patient being monitored Patient Re-evaluated:Patient Re-evaluated prior to induction Oxygen Delivery Method: Circle system utilized Preoxygenation: Pre-oxygenation with 100% oxygen Induction Type: IV induction Ventilation: Mask ventilation without difficulty LMA: LMA inserted LMA Size: 4.0 Number of attempts: 1 Placement Confirmation: breath sounds checked- equal and bilateral,  CO2 detector and positive ETCO2 Tube secured with: Tape Dental Injury: Teeth and Oropharynx as per pre-operative assessment

## 2016-10-24 NOTE — Anesthesia Postprocedure Evaluation (Signed)
Anesthesia Post Note  Patient: Shaun Ball  Procedure(s) Performed: Procedure(s) (LRB): CYSTOSCOPY WITH BLADDER BIOPSY/ BILATERAL RETROGRADE PYLOGRAM (Bilateral)     Patient location during evaluation: PACU Anesthesia Type: General Level of consciousness: awake and alert Pain management: pain level controlled Vital Signs Assessment: post-procedure vital signs reviewed and stable Respiratory status: spontaneous breathing, nonlabored ventilation, respiratory function stable and patient connected to nasal cannula oxygen Cardiovascular status: blood pressure returned to baseline and stable Postop Assessment: no apparent nausea or vomiting Anesthetic complications: no    Last Vitals:  Vitals:   10/24/16 1215 10/24/16 1230  BP: (!) 149/74 118/66  Pulse: 74 68  Resp: 16 17  Temp:    SpO2: 99% 98%    Last Pain:  Vitals:   10/24/16 1201  TempSrc:   PainSc: 0-No pain                 Mushka Laconte S

## 2016-10-24 NOTE — Interval H&P Note (Signed)
History and Physical Interval Note:  10/24/2016 11:11 AM  Shaun Ball  has presented today for surgery, with the diagnosis of BLADDER LESION  The various methods of treatment have been discussed with the patient and family. After consideration of risks, benefits and other options for treatment, the patient has consented to  Procedure(s): CYSTOSCOPY WITH BLADDER BIOPSY/ RETROGRADE (N/A) as a surgical intervention .  The patient's history has been reviewed, patient examined, no change in status, stable for surgery.  I have reviewed the patient's chart and labs.  Questions were answered to the patient's satisfaction.     Conception Oms [name]

## 2016-10-27 ENCOUNTER — Encounter (HOSPITAL_BASED_OUTPATIENT_CLINIC_OR_DEPARTMENT_OTHER): Payer: Self-pay | Admitting: Urology

## 2016-10-29 ENCOUNTER — Other Ambulatory Visit: Payer: Self-pay | Admitting: Urology

## 2016-10-31 ENCOUNTER — Encounter (HOSPITAL_BASED_OUTPATIENT_CLINIC_OR_DEPARTMENT_OTHER): Payer: Self-pay | Admitting: *Deleted

## 2016-10-31 NOTE — Progress Notes (Signed)
NPO AFTER MN.  ARRIVE AT 0700.  NEEDS ISTAT.  CURRENT EKG IN CHART AND EPIC.  WILL TAKE FLOMAX AM DOS W/ SIPS OF WATER.

## 2016-11-04 NOTE — H&P (Signed)
Urology Preoperative H&P   Chief Complaint: Bladder cancer  History of Present Illness: Shaun Ball is a 66 y.o. male with newly diagnosed CIS of the bladder following limited bladder biopsy on 10/24/2016.  During his initial cystoscopy, he was found to have a large erythematous area involving the majority of the right side of his bladder/bladder dome.  Due to the large area of involvement and uncertain pathology, limited bladder biopsies were performed at that time.  Currently, the patient denies any voiding complaints, hematuria or fever/chills.      Past Medical History:  Diagnosis Date  . Bladder cancer ([place])   . Dysuria   . History of kidney stones 2012   passed / has renal cyst to evaluated at later time  . Hypertension   . Lesion of bladder   . PONV (postoperative nausea and vomiting)    10 yrs ago  no problems with last 2 surgeries  . Wears glasses     Past Surgical History:  Procedure Laterality Date  . CYSTOSCOPY WITH BIOPSY Bilateral 10/24/2016   Procedure: CYSTOSCOPY WITH BLADDER BIOPSY/ BILATERAL RETROGRADE ZWCHENID;  Surgeon: Ceasar Mons, MD;  Location: [facility];  Service: Urology;  Laterality: Bilateral;  . 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 left 03-25-2006;  right 07-07-2006   and Gastroc Slide    Allergies:  Allergies  Allergen Reactions  . Statins Hives    Rash,whelps,liver enzymes elevated    History reviewed. No pertinent family history.  Social History:  reports that he quit smoking about 17 years ago. His smoking use included Cigarettes. He quit after 28.00 years of use. He has never used smokeless tobacco. He reports that he drinks about 4.2 oz of alcohol per week . He reports that he does not use drugs.  ROS: A complete review of systems was performed.  All systems are negative except for pertinent findings as noted.  Physical Exam:  Vital signs in last 24 hours:   Constitutional:   Alert and oriented, No acute distress Cardiovascular: Regular rate and rhythm, No JVD Respiratory: Normal respiratory effort, Lungs clear bilaterally GI: Abdomen is soft, nontender, nondistended, no abdominal masses GU: No CVA tenderness Lymphatic: No lymphadenopathy Neurologic: Grossly intact, no focal deficits Psychiatric: Normal mood and affect  Laboratory Data:  No results for input(s): WBC, HGB, HCT, PLT in the last 72 hours.  No results for input(s): NA, K, CL, GLUCOSE, BUN, CALCIUM, CREATININE in the last 72 hours.  Invalid input(s): CO3   No results found for this or any previous visit (from the past 24 hour(s)). No results found for this or any previous visit (from the past 240 hour(s)).  Renal Function: No results for input(s): CREATININE in the last 168 hours. CrCl cannot be calculated (No order found.).  Radiologic Imaging: No results found.  I independently reviewed the above imaging studies.  Assessment and Plan Shaun Ball is a 66 y.o. male with CIS of the bladder  -The risks, benefits and alternatives of cystoscopy with TURBT of the remaining cancerous lesion involving the rightward bladder was discussed with the patient.  Risks including, but are not limited to, hematuria, UTI, urinary retention and possible bladder perforation requiring prolonged catheterization vs. open conversion.  He voices understanding and wishes to proceed.  [name], MD 11/04/2016, 2:01 PM  [facility] Pager: [phone]

## 2016-11-05 ENCOUNTER — Ambulatory Visit (HOSPITAL_BASED_OUTPATIENT_CLINIC_OR_DEPARTMENT_OTHER): Payer: Medicare Other | Admitting: Anesthesiology

## 2016-11-05 ENCOUNTER — Ambulatory Visit (HOSPITAL_BASED_OUTPATIENT_CLINIC_OR_DEPARTMENT_OTHER)
Admission: RE | Admit: 2016-11-05 | Discharge: 2016-11-05 | Disposition: A | Discharge status: Home / Self Care | Payer: Medicare Other | Source: Ambulatory Visit | Attending: Urology | Admitting: Urology

## 2016-11-05 ENCOUNTER — Encounter (HOSPITAL_BASED_OUTPATIENT_CLINIC_OR_DEPARTMENT_OTHER): Payer: Self-pay

## 2016-11-05 ENCOUNTER — Encounter (HOSPITAL_BASED_OUTPATIENT_CLINIC_OR_DEPARTMENT_OTHER)
Admission: RE | Disposition: A | Discharge status: Home / Self Care | Payer: Self-pay | Source: Ambulatory Visit | Attending: Urology

## 2016-11-05 DIAGNOSIS — C679 Malignant neoplasm of bladder, unspecified: Secondary | ICD-10-CM | POA: Diagnosis not present

## 2016-11-05 DIAGNOSIS — Z87891 Personal history of nicotine dependence: Secondary | ICD-10-CM | POA: Insufficient documentation

## 2016-11-05 DIAGNOSIS — Z87442 Personal history of urinary calculi: Secondary | ICD-10-CM | POA: Insufficient documentation

## 2016-11-05 DIAGNOSIS — I10 Essential (primary) hypertension: Secondary | ICD-10-CM | POA: Insufficient documentation

## 2016-11-05 DIAGNOSIS — N281 Cyst of kidney, acquired: Secondary | ICD-10-CM | POA: Insufficient documentation

## 2016-11-05 DIAGNOSIS — C674 Malignant neoplasm of posterior wall of bladder: Secondary | ICD-10-CM | POA: Diagnosis not present

## 2016-11-05 DIAGNOSIS — D090 Carcinoma in situ of bladder: Secondary | ICD-10-CM | POA: Diagnosis not present

## 2016-11-05 HISTORY — DX: Presence of spectacles and contact lenses: Z97.3

## 2016-11-05 HISTORY — PX: TRANSURETHRAL RESECTION OF BLADDER TUMOR: SHX2575

## 2016-11-05 HISTORY — PX: CYSTOSCOPY: SHX5120

## 2016-11-05 HISTORY — DX: Malignant neoplasm of bladder, unspecified: C67.9

## 2016-11-05 HISTORY — DX: Dysuria: R30.0

## 2016-11-05 SURGERY — TURBT (TRANSURETHRAL RESECTION OF BLADDER TUMOR)
Anesthesia: General | Site: Bladder

## 2016-11-05 MED ORDER — LIDOCAINE 2% (20 MG/ML) 5 ML SYRINGE
INTRAMUSCULAR | Status: AC
  Filled 2016-11-05: qty 5

## 2016-11-05 MED ORDER — OXYCODONE HCL 5 MG PO TABS
ORAL_TABLET | ORAL | Status: AC
  Filled 2016-11-05: qty 1

## 2016-11-05 MED ORDER — SULFAMETHOXAZOLE-TRIMETHOPRIM 800-160 MG PO TABS: 1.0000 | ORAL_TABLET | Freq: Two times a day (BID) | ORAL | 0 refills | Status: DC

## 2016-11-05 MED ORDER — DEXAMETHASONE SODIUM PHOSPHATE 10 MG/ML IJ SOLN
INTRAMUSCULAR | Status: AC
  Filled 2016-11-05: qty 1

## 2016-11-05 MED ORDER — HYDROMORPHONE HCL 1 MG/ML IJ SOLN
0.2500 mg | INTRAMUSCULAR | Status: DC | PRN
  Administered 2016-11-05: 0.5 mg via INTRAVENOUS
  Filled 2016-11-05: qty 0.5

## 2016-11-05 MED ORDER — FENTANYL CITRATE (PF) 100 MCG/2ML IJ SOLN
INTRAMUSCULAR | Status: DC | PRN
  Administered 2016-11-05: 25 ug via INTRAVENOUS
  Administered 2016-11-05: 50 ug via INTRAVENOUS
  Administered 2016-11-05 (×5): 25 ug via INTRAVENOUS

## 2016-11-05 MED ORDER — OXYCODONE HCL 5 MG/5ML PO SOLN
5.0000 mg | Freq: Once | ORAL | Status: AC | PRN
  Filled 2016-11-05: qty 5

## 2016-11-05 MED ORDER — ONDANSETRON HCL 4 MG/2ML IJ SOLN
INTRAMUSCULAR | Status: DC | PRN
  Administered 2016-11-05: 4 mg via INTRAVENOUS

## 2016-11-05 MED ORDER — MIDAZOLAM HCL 2 MG/2ML IJ SOLN
INTRAMUSCULAR | Status: AC
  Filled 2016-11-05: qty 2

## 2016-11-05 MED ORDER — HYDROMORPHONE HCL-NACL 0.5-0.9 MG/ML-% IV SOSY
PREFILLED_SYRINGE | INTRAVENOUS | Status: AC
  Filled 2016-11-05: qty 1

## 2016-11-05 MED ORDER — OXYBUTYNIN CHLORIDE 5 MG PO TABS: 5.0000 mg | ORAL_TABLET | Freq: Three times a day (TID) | ORAL | 0 refills | Status: DC | PRN

## 2016-11-05 MED ORDER — HYDROCODONE-ACETAMINOPHEN 5-325 MG PO TABS: 1.0000 | ORAL_TABLET | Freq: Four times a day (QID) | ORAL | 0 refills | Status: DC | PRN

## 2016-11-05 MED ORDER — OXYBUTYNIN CHLORIDE 5 MG PO TABS
ORAL_TABLET | ORAL | Status: AC
  Filled 2016-11-05: qty 1

## 2016-11-05 MED ORDER — OXYCODONE HCL 5 MG PO TABS
5.0000 mg | ORAL_TABLET | Freq: Once | ORAL | Status: AC | PRN
  Administered 2016-11-05: 5 mg via ORAL
  Filled 2016-11-05: qty 1

## 2016-11-05 MED ORDER — DEXAMETHASONE SODIUM PHOSPHATE 4 MG/ML IJ SOLN
INTRAMUSCULAR | Status: DC | PRN
  Administered 2016-11-05: 10 mg via INTRAVENOUS

## 2016-11-05 MED ORDER — OXYBUTYNIN CHLORIDE 5 MG PO TABS
5.0000 mg | ORAL_TABLET | Freq: Three times a day (TID) | ORAL | Status: DC | PRN
  Administered 2016-11-05: 5 mg via ORAL
  Filled 2016-11-05: qty 1

## 2016-11-05 MED ORDER — CEFAZOLIN SODIUM-DEXTROSE 2-4 GM/100ML-% IV SOLN
INTRAVENOUS | Status: AC
  Filled 2016-11-05: qty 100

## 2016-11-05 MED ORDER — PROPOFOL 10 MG/ML IV BOLUS
INTRAVENOUS | Status: AC
  Filled 2016-11-05: qty 20

## 2016-11-05 MED ORDER — BELLADONNA ALKALOIDS-OPIUM 16.2-60 MG RE SUPP
RECTAL | Status: AC
  Filled 2016-11-05: qty 1

## 2016-11-05 MED ORDER — LACTATED RINGERS IV SOLN
INTRAVENOUS | Status: DC
  Administered 2016-11-05 (×2): via INTRAVENOUS
  Filled 2016-11-05: qty 1000

## 2016-11-05 MED ORDER — PROMETHAZINE HCL 25 MG/ML IJ SOLN
6.2500 mg | INTRAMUSCULAR | Status: DC | PRN
  Filled 2016-11-05: qty 1

## 2016-11-05 MED ORDER — BELLADONNA ALKALOIDS-OPIUM 16.2-60 MG RE SUPP
RECTAL | Status: DC | PRN
  Administered 2016-11-05: 1 via RECTAL

## 2016-11-05 MED ORDER — PROPOFOL 10 MG/ML IV BOLUS
INTRAVENOUS | Status: DC | PRN
  Administered 2016-11-05: 50 mg via INTRAVENOUS
  Administered 2016-11-05: 200 mg via INTRAVENOUS

## 2016-11-05 MED ORDER — SODIUM CHLORIDE 0.9 % IR SOLN
Status: DC | PRN
  Administered 2016-11-05: 6000 mL

## 2016-11-05 MED ORDER — CEFAZOLIN SODIUM-DEXTROSE 2-4 GM/100ML-% IV SOLN
2.0000 g | INTRAVENOUS | Status: AC
  Administered 2016-11-05: 2 g via INTRAVENOUS
  Filled 2016-11-05: qty 100

## 2016-11-05 MED ORDER — ONDANSETRON HCL 4 MG/2ML IJ SOLN
INTRAMUSCULAR | Status: AC
  Filled 2016-11-05: qty 2

## 2016-11-05 MED ORDER — FENTANYL CITRATE (PF) 100 MCG/2ML IJ SOLN
INTRAMUSCULAR | Status: AC
  Filled 2016-11-05: qty 2

## 2016-11-05 MED ORDER — MIDAZOLAM HCL 5 MG/5ML IJ SOLN
INTRAMUSCULAR | Status: DC | PRN
  Administered 2016-11-05: 2 mg via INTRAVENOUS

## 2016-11-05 MED ORDER — FENTANYL CITRATE (PF) 100 MCG/2ML IJ SOLN
INTRAMUSCULAR | Status: AC
Start: 2016-11-05 — End: 2016-11-05
  Filled 2016-11-05: qty 2

## 2016-11-05 MED ORDER — LIDOCAINE HCL (CARDIAC) 20 MG/ML IV SOLN
INTRAVENOUS | Status: DC | PRN
  Administered 2016-11-05: 60 mg via INTRAVENOUS

## 2016-11-05 SURGICAL SUPPLY — 23 items
BAG DRAIN URO-CYSTO SKYTR STRL (DRAIN) ×3 IMPLANT
BAG DRN UROCATH (DRAIN) ×1
BAG URINE DRAINAGE (UROLOGICAL SUPPLIES) ×2 IMPLANT
BAG URINE LEG 500ML (DRAIN) ×2 IMPLANT
BAG URO CATCHER STRL LF (MISCELLANEOUS) ×3 IMPLANT
CATH FOLEY 2WAY SLVR  5CC 18FR (CATHETERS) ×2
CATH FOLEY 2WAY SLVR 5CC 18FR (CATHETERS) IMPLANT
GLOVE BIO SURGEON STRL SZ7 (GLOVE) ×2 IMPLANT
GLOVE BIO SURGEON STRL SZ7.5 (GLOVE) ×3 IMPLANT
GLOVE BIOGEL PI IND STRL 7.5 (GLOVE) IMPLANT
GLOVE BIOGEL PI INDICATOR 7.5 (GLOVE) ×2
GOWN STRL REUS W/TWL XL LVL3 (GOWN DISPOSABLE) ×5 IMPLANT
HOLDER FOLEY CATH W/STRAP (MISCELLANEOUS) ×2 IMPLANT
IV NS IRRIG 3000ML ARTHROMATIC (IV SOLUTION) ×6 IMPLANT
LOOP CUT BIPOLAR 24F LRG (ELECTROSURGICAL) ×2 IMPLANT
MANIFOLD NEPTUNE II (INSTRUMENTS) ×3 IMPLANT
NS IRRIG 500ML POUR BTL (IV SOLUTION) IMPLANT
PACK CYSTO (CUSTOM PROCEDURE TRAY) ×3 IMPLANT
SYRINGE IRR TOOMEY STRL 70CC (SYRINGE) IMPLANT
TUBE CONNECTING 12'X1/4 (SUCTIONS) ×1
TUBE CONNECTING 12X1/4 (SUCTIONS) ×2 IMPLANT
WATER STERILE IRR 3000ML UROMA (IV SOLUTION) ×2 IMPLANT
WATER STERILE IRR 500ML POUR (IV SOLUTION) IMPLANT

## 2016-11-05 NOTE — Discharge Instructions (Signed)
°  Post Anesthesia Home Care Instructions  Activity: Get plenty of rest for the remainder of the day. A responsible individual must stay with you for 24 hours following the procedure.  For the next 24 hours, DO NOT: -Drive a car -Paediatric nurse -Drink alcoholic beverages -Take any medication unless instructed by your physician -Make any legal decisions or sign important papers.  Meals: Start with liquid foods such as gelatin or soup. Progress to regular foods as tolerated. Avoid greasy, spicy, heavy foods. If nausea and/or vomiting occur, drink only clear liquids until the nausea and/or vomiting subsides. Call your physician if vomiting continues.  Special Instructions/Symptoms: Your throat may feel dry or sore from the anesthesia or the breathing tube placed in your throat during surgery. If this causes discomfort, gargle with warm salt water. The discomfort should disappear within 24 hours.  If you had a scopolamine patch placed behind your ear for the management of post- operative nausea and/or vomiting:  1. The medication in the patch is effective for 72 hours, after which it should be removed.  Wrap patch in a tissue and discard in the trash. Wash hands thoroughly with soap and water. 2. You may remove the patch earlier than 72 hours if you experience unpleasant side effects which may include dry mouth, dizziness or visual disturbances. 3. Avoid touching the patch. Wash your hands with soap and water after contact with the patch.    Remove Foley catheter at 6:00 AM on 11/07/2016.  If unable to void after 4 hours, call the office for catheter replacement.

## 2016-11-05 NOTE — Anesthesia Procedure Notes (Signed)
Procedure Name: LMA Insertion Date/Time: 11/05/2016 8:35 AM Performed by: Justice Rocher Pre-anesthesia Checklist: Patient identified, Emergency Drugs available, Suction available and Patient being monitored Patient Re-evaluated:Patient Re-evaluated prior to induction Oxygen Delivery Method: Circle system utilized Preoxygenation: Pre-oxygenation with 100% oxygen Induction Type: IV induction Ventilation: Mask ventilation without difficulty LMA: LMA inserted LMA Size: 5.0 Number of attempts: 1 Airway Equipment and Method: Bite block Placement Confirmation: positive ETCO2 and breath sounds checked- equal and bilateral Tube secured with: Tape Dental Injury: Teeth and Oropharynx as per pre-operative assessment

## 2016-11-05 NOTE — Anesthesia Preprocedure Evaluation (Signed)
Anesthesia Evaluation  Patient identified by MRN, date of birth, ID band Patient awake    Reviewed: Allergy & Precautions, NPO status , Patient's Chart, lab work & pertinent test results  Airway Mallampati: II  TM Distance: >3 FB Neck ROM: Full    Dental no notable dental hx.    Pulmonary neg pulmonary ROS, former smoker,    Pulmonary exam normal breath sounds clear to auscultation       Cardiovascular hypertension, Normal cardiovascular exam Rhythm:Regular Rate:Normal     Neuro/Psych negative neurological ROS  negative psych ROS   GI/Hepatic negative GI ROS, Neg liver ROS,   Endo/Other  negative endocrine ROS  Renal/GU negative Renal ROS  negative genitourinary   Musculoskeletal negative musculoskeletal ROS (+)   Abdominal   Peds negative pediatric ROS (+)  Hematology negative hematology ROS (+)   Anesthesia Other Findings   Reproductive/Obstetrics negative OB ROS                             Anesthesia Physical Anesthesia Plan  ASA: II  Anesthesia Plan: General   Post-op Pain Management:    Induction: Intravenous  PONV Risk Score and Plan: 2 and Ondansetron and Dexamethasone  Airway Management Planned: LMA and Oral ETT  Additional Equipment:   Intra-op Plan:   Post-operative Plan: Extubation in OR  Informed Consent: I have reviewed the patients History and Physical, chart, labs and discussed the procedure including the risks, benefits and alternatives for the proposed anesthesia with the patient or authorized representative who has indicated his/her understanding and acceptance.   Dental advisory given  Plan Discussed with: CRNA and Surgeon  Anesthesia Plan Comments:         Anesthesia Quick Evaluation

## 2016-11-05 NOTE — Anesthesia Postprocedure Evaluation (Signed)
Anesthesia Post Note  Patient: Shaun Ball  Procedure(s) Performed: Procedure(s) (LRB): TRANSURETHRAL RESECTION OF BLADDER TUMOR (TURBT) (N/A) CYSTOSCOPY (N/A)     Patient location during evaluation: PACU Anesthesia Type: General Level of consciousness: awake and alert Pain management: pain level controlled Vital Signs Assessment: post-procedure vital signs reviewed and stable Respiratory status: spontaneous breathing, nonlabored ventilation, respiratory function stable and patient connected to nasal cannula oxygen Cardiovascular status: blood pressure returned to baseline and stable Postop Assessment: no apparent nausea or vomiting Anesthetic complications: no    Last Vitals:  Vitals:   11/05/16 0919 11/05/16 0930  BP: (!) 141/75 (!) 131/59  Pulse: 80 80  Resp: 12 12  Temp: 36.6 C   SpO2: 100% 99%    Last Pain:  Vitals:   11/05/16 1000  TempSrc:   PainSc: 3                  Molly Maselli S

## 2016-11-05 NOTE — Op Note (Signed)
Operative Note  Preoperative diagnosis:  1. CIS of the bladder  Postoperative diagnosis: 1. CIS of the bladder  Procedure(s): 1. TURBT of 5 cm right posterior bladder tumor  Surgeon: [name], MD   Anesthesia: Gen. LMA  Complications: None  EBL: 5 mL  Specimens: 1. Superficial and deep margins of right posterior bladder tumor  Drains/Catheters: 1. 18 French Foley  Intraoperative findings: 5 cm erythematous, superficial bladder lesion involving the right posterior bladder wall  Indication: Shaun Ball is a 66 y.o. male with newly diagnosed CIS of the bladder following limited bladder biopsy on 10/24/2016.  During his initial cystoscopy, he was found to have a large erythematous area involving the majority of the right side of his bladder/bladder dome.  Due to the large area of involvement and uncertain pathology, limited bladder biopsies were performed at that time.  He is here today for repeat TURBT and complete resection of the bladder lesion.  Description of procedure:  After informed consent was obtained, the patient was brought to the operating room and general LMA anesthesia was administered. The patient was then placed in the dorsolithotomy position and prepped and draped in usual sterile fashion. A timeout was performed. A 26 French resectoscope with a bipolar loop working element was then inserted into the urethral meatus and advanced into the bladder under direct vision. A complete bladder survey revealed the previously identified 5 cm bladder lesion involving the right posterior bladder wall.  The bladder lesion in question was incompletely resected using the bipolar loop. Deep muscular margins were sent as a separate specimen. The area resection was then fulgurated until hemostasis was achieved. All resected specimens were then removed from the bladder through the cystoscope. An 87 French Foley cath was in place with 10 mL of sterile water in the balloon. The  catheter was draining clear light pink irrigant at the conclusion of the case. A B&O suppository was then placed. The patient tolerated the procedure well and was transferred to the postanesthesia in stable condition.  Plan: Follow-up in 6 weeks to start around 1 of BCG therapy.

## 2016-11-05 NOTE — Transfer of Care (Signed)
Immediate Anesthesia Transfer of Care Note  Patient: Shaun Ball  Procedure(s) Performed: Procedure(s) (LRB): TRANSURETHRAL RESECTION OF BLADDER TUMOR (TURBT) (N/A) CYSTOSCOPY (N/A)  Patient Location: PACU  Anesthesia Type: General  Level of Consciousness: awake, sedated, patient cooperative and responds to stimulation  Airway & Oxygen Therapy: Patient Spontanous Breathing and Patient connected to [place] oxygen  Post-op Assessment: Report given to PACU RN, Post -op Vital signs reviewed and stable and Patient moving all extremities  Post vital signs: Reviewed and stable  Complications: No apparent anesthesia complications

## 2016-11-05 NOTE — Interval H&P Note (Signed)
History and Physical Interval Note:  11/05/2016 7:51 AM  Shaun Ball  has presented today for surgery, with the diagnosis of BLADDER CANCER  The various methods of treatment have been discussed with the patient and family. After consideration of risks, benefits and other options for treatment, the patient has consented to  Procedure(s): TRANSURETHRAL RESECTION OF BLADDER TUMOR (TURBT) (N/A) CYSTOSCOPY (N/A) as a surgical intervention .  The patient's history has been reviewed, patient examined, no change in status, stable for surgery.  I have reviewed the patient's chart and labs.  Questions were answered to the patient's satisfaction.     [name]

## 2016-11-06 ENCOUNTER — Encounter (HOSPITAL_BASED_OUTPATIENT_CLINIC_OR_DEPARTMENT_OTHER): Payer: Self-pay | Admitting: Urology

## 2016-11-18 DIAGNOSIS — Z6827 Body mass index (BMI) 27.0-27.9, adult: Secondary | ICD-10-CM | POA: Diagnosis not present

## 2016-11-18 DIAGNOSIS — Z1389 Encounter for screening for other disorder: Secondary | ICD-10-CM | POA: Diagnosis not present

## 2016-11-18 DIAGNOSIS — C679 Malignant neoplasm of bladder, unspecified: Secondary | ICD-10-CM | POA: Diagnosis not present

## 2016-11-18 DIAGNOSIS — R0602 Shortness of breath: Secondary | ICD-10-CM | POA: Diagnosis not present

## 2016-11-19 DIAGNOSIS — S22009S Unspecified fracture of unspecified thoracic vertebra, sequela: Secondary | ICD-10-CM | POA: Diagnosis not present

## 2016-12-01 DIAGNOSIS — Z23 Encounter for immunization: Secondary | ICD-10-CM | POA: Diagnosis not present

## 2016-12-02 ENCOUNTER — Other Ambulatory Visit: Payer: Self-pay | Admitting: Endocrinology

## 2016-12-02 DIAGNOSIS — S22009S Unspecified fracture of unspecified thoracic vertebra, sequela: Secondary | ICD-10-CM

## 2016-12-03 ENCOUNTER — Ambulatory Visit
Admission: RE | Admit: 2016-12-03 | Discharge: 2016-12-03 | Disposition: A | Discharge status: Home / Self Care | Payer: Medicare Other | Source: Ambulatory Visit | Attending: Endocrinology | Admitting: Endocrinology

## 2016-12-03 DIAGNOSIS — S22050A Wedge compression fracture of T5-T6 vertebra, initial encounter for closed fracture: Secondary | ICD-10-CM | POA: Diagnosis not present

## 2016-12-03 DIAGNOSIS — S22009S Unspecified fracture of unspecified thoracic vertebra, sequela: Secondary | ICD-10-CM

## 2016-12-23 DIAGNOSIS — N3000 Acute cystitis without hematuria: Secondary | ICD-10-CM | POA: Diagnosis not present

## 2016-12-23 DIAGNOSIS — R3121 Asymptomatic microscopic hematuria: Secondary | ICD-10-CM | POA: Diagnosis not present

## 2016-12-30 DIAGNOSIS — Z5111 Encounter for antineoplastic chemotherapy: Secondary | ICD-10-CM | POA: Diagnosis not present

## 2016-12-30 DIAGNOSIS — C672 Malignant neoplasm of lateral wall of bladder: Secondary | ICD-10-CM | POA: Diagnosis not present

## 2017-01-06 DIAGNOSIS — Z5111 Encounter for antineoplastic chemotherapy: Secondary | ICD-10-CM | POA: Diagnosis not present

## 2017-01-06 DIAGNOSIS — C672 Malignant neoplasm of lateral wall of bladder: Secondary | ICD-10-CM | POA: Diagnosis not present

## 2017-01-13 DIAGNOSIS — Z5111 Encounter for antineoplastic chemotherapy: Secondary | ICD-10-CM | POA: Diagnosis not present

## 2017-01-13 DIAGNOSIS — C672 Malignant neoplasm of lateral wall of bladder: Secondary | ICD-10-CM | POA: Diagnosis not present

## 2017-01-20 DIAGNOSIS — Z5111 Encounter for antineoplastic chemotherapy: Secondary | ICD-10-CM | POA: Diagnosis not present

## 2017-01-20 DIAGNOSIS — C672 Malignant neoplasm of lateral wall of bladder: Secondary | ICD-10-CM | POA: Diagnosis not present

## 2017-01-27 DIAGNOSIS — C672 Malignant neoplasm of lateral wall of bladder: Secondary | ICD-10-CM | POA: Diagnosis not present

## 2017-01-27 DIAGNOSIS — Z5111 Encounter for antineoplastic chemotherapy: Secondary | ICD-10-CM | POA: Diagnosis not present

## 2017-02-04 DIAGNOSIS — Z5111 Encounter for antineoplastic chemotherapy: Secondary | ICD-10-CM | POA: Diagnosis not present

## 2017-02-04 DIAGNOSIS — C672 Malignant neoplasm of lateral wall of bladder: Secondary | ICD-10-CM | POA: Diagnosis not present

## 2017-02-20 DIAGNOSIS — C672 Malignant neoplasm of lateral wall of bladder: Secondary | ICD-10-CM | POA: Diagnosis not present

## 2017-04-09 ENCOUNTER — Other Ambulatory Visit (HOSPITAL_COMMUNITY): Payer: Self-pay | Admitting: Urology

## 2017-04-09 DIAGNOSIS — N281 Cyst of kidney, acquired: Secondary | ICD-10-CM

## 2017-04-24 ENCOUNTER — Ambulatory Visit (HOSPITAL_COMMUNITY)
Admission: RE | Admit: 2017-04-24 | Discharge: 2017-04-24 | Disposition: A | Discharge status: Home / Self Care | Payer: Medicare Other | Source: Ambulatory Visit | Attending: Urology | Admitting: Urology

## 2017-04-24 DIAGNOSIS — I700 Atherosclerosis of aorta: Secondary | ICD-10-CM | POA: Diagnosis not present

## 2017-04-24 DIAGNOSIS — N2889 Other specified disorders of kidney and ureter: Secondary | ICD-10-CM | POA: Diagnosis not present

## 2017-04-24 DIAGNOSIS — K760 Fatty (change of) liver, not elsewhere classified: Secondary | ICD-10-CM | POA: Diagnosis not present

## 2017-04-24 DIAGNOSIS — N281 Cyst of kidney, acquired: Secondary | ICD-10-CM | POA: Insufficient documentation

## 2017-04-24 LAB — POCT I-STAT CREATININE: CREATININE: 1 mg/dL (ref 0.61–1.24)

## 2017-04-24 MED ORDER — GADOBENATE DIMEGLUMINE 529 MG/ML IV SOLN
20.0000 mL | Freq: Once | INTRAVENOUS | Status: AC | PRN
  Administered 2017-04-24: 17 mL via INTRAVENOUS

## 2017-06-02 DIAGNOSIS — Z5111 Encounter for antineoplastic chemotherapy: Secondary | ICD-10-CM | POA: Diagnosis not present

## 2017-06-02 DIAGNOSIS — C672 Malignant neoplasm of lateral wall of bladder: Secondary | ICD-10-CM | POA: Diagnosis not present

## 2017-06-09 DIAGNOSIS — C672 Malignant neoplasm of lateral wall of bladder: Secondary | ICD-10-CM | POA: Diagnosis not present

## 2017-06-09 DIAGNOSIS — Z5111 Encounter for antineoplastic chemotherapy: Secondary | ICD-10-CM | POA: Diagnosis not present

## 2017-06-16 DIAGNOSIS — C672 Malignant neoplasm of lateral wall of bladder: Secondary | ICD-10-CM | POA: Diagnosis not present

## 2017-06-16 DIAGNOSIS — Z5111 Encounter for antineoplastic chemotherapy: Secondary | ICD-10-CM | POA: Diagnosis not present

## 2017-06-19 DIAGNOSIS — N3000 Acute cystitis without hematuria: Secondary | ICD-10-CM | POA: Diagnosis not present

## 2017-07-02 DIAGNOSIS — C672 Malignant neoplasm of lateral wall of bladder: Secondary | ICD-10-CM | POA: Diagnosis not present

## 2017-07-07 DIAGNOSIS — H5203 Hypermetropia, bilateral: Secondary | ICD-10-CM | POA: Diagnosis not present

## 2017-07-07 DIAGNOSIS — H18413 Arcus senilis, bilateral: Secondary | ICD-10-CM | POA: Diagnosis not present

## 2017-07-07 DIAGNOSIS — H33301 Unspecified retinal break, right eye: Secondary | ICD-10-CM | POA: Diagnosis not present

## 2017-07-07 DIAGNOSIS — H0100A Unspecified blepharitis right eye, upper and lower eyelids: Secondary | ICD-10-CM | POA: Diagnosis not present

## 2017-07-07 DIAGNOSIS — H40003 Preglaucoma, unspecified, bilateral: Secondary | ICD-10-CM | POA: Diagnosis not present

## 2017-07-07 DIAGNOSIS — H25013 Cortical age-related cataract, bilateral: Secondary | ICD-10-CM | POA: Diagnosis not present

## 2017-07-07 DIAGNOSIS — H0100B Unspecified blepharitis left eye, upper and lower eyelids: Secondary | ICD-10-CM | POA: Diagnosis not present

## 2017-07-07 DIAGNOSIS — H524 Presbyopia: Secondary | ICD-10-CM | POA: Diagnosis not present

## 2017-07-07 DIAGNOSIS — H04123 Dry eye syndrome of bilateral lacrimal glands: Secondary | ICD-10-CM | POA: Diagnosis not present

## 2017-07-07 DIAGNOSIS — H2513 Age-related nuclear cataract, bilateral: Secondary | ICD-10-CM | POA: Diagnosis not present

## 2017-07-07 DIAGNOSIS — H40013 Open angle with borderline findings, low risk, bilateral: Secondary | ICD-10-CM | POA: Diagnosis not present

## 2017-07-07 DIAGNOSIS — H52223 Regular astigmatism, bilateral: Secondary | ICD-10-CM | POA: Diagnosis not present

## 2017-07-13 ENCOUNTER — Encounter (INDEPENDENT_AMBULATORY_CARE_PROVIDER_SITE_OTHER): Payer: Self-pay | Admitting: Ophthalmology

## 2017-07-20 NOTE — Progress Notes (Signed)
[facility] Note  07/21/2017     CHIEF COMPLAINT Patient presents for Retina Evaluation   HISTORY OF PRESENT ILLNESS: Shaun Ball is a 67 y.o. male who presents to the clinic today for:   HPI    Retina Evaluation    In right eye.  Duration of 10 days.  Associated Symptoms Pain.  Negative for Flashes, Blind Spot, Photophobia, Scalp Tenderness, Fever, Weight Loss, Jaw Claudication, Glare, Floaters, Distortion, Redness, Trauma, Shoulder/Hip pain and Fatigue.  Context:  distance vision, mid-range vision and near vision.  Treatments tried include no treatments.  I, the attending physician,  performed the HPI with the patient and updated documentation appropriately.          Comments    Pt presents on the referral of Dr. Shirley Muscat for possible retinal tear OD, pt saw Dr. Shirley Muscat on June 1 for annual check up, pt states he has not had any floaters, flashes of light or wavy vision lately, pt states he has pain off and on, pt denies the use of gtts/vits, pt denies being diabetic       Last edited by Bernarda Caffey, MD on 07/21/2017 10:05 AM. (History)    Pt states he sees Dr. Shirley Muscat for routine care; Pt states he "found a spot in my right eye"; Pt states he has a strong family history of macular degeneration;   Referring physician: Calton Dach, MD [place],  [id number]  HISTORICAL INFORMATION:   Selected notes from the MEDICAL RECORD NUMBER Referred by Dr. Thurston Hole for concern of retinal tear OD LEE: 06.01.19 (S. Bernstorf) [BCVA: OD: 20/20 OS: 20/25+2] Ocular Hx-Glaucoma, Cataract OU, blepharitis OU, dry eye syndrome, retinal break PMH-HTN    CURRENT MEDICATIONS: No current outpatient medications on file. (Ophthalmic Drugs)   No current facility-administered medications for this visit.  (Ophthalmic Drugs)   Current Outpatient Medications (Other)  Medication Sig  . diphenhydrAMINE (BENADRYL) 25 MG tablet Take  25 mg by mouth as needed.  . fluticasone (FLONASE) 50 MCG/ACT nasal spray Place 1 spray into both nostrils as needed for allergies or rhinitis.  Marland Kitchen HYDROcodone-acetaminophen (NORCO/VICODIN) 5-325 MG tablet Take 1 tablet by mouth every 6 (six) hours as needed for moderate pain.  Marland Kitchen ibuprofen (ADVIL,MOTRIN) 200 MG tablet Take 200 mg by mouth as needed. 2 tablets  . lisinopril (PRINIVIL,ZESTRIL) 40 MG tablet Take 40 mg by mouth every evening.  . montelukast (SINGULAIR) 10 MG tablet Take 10 mg by mouth daily.  Marland Kitchen oxybutynin (DITROPAN) 5 MG tablet Take 1 tablet (5 mg total) by mouth every 8 (eight) hours as needed for bladder spasms.  . phenazopyridine (PYRIDIUM) 200 MG tablet Take 1 tablet (200 mg total) by mouth 3 (three) times daily as needed for pain.  Marland Kitchen sulfamethoxazole-trimethoprim (BACTRIM DS,SEPTRA DS) 800-160 MG tablet Take 1 tablet by mouth 2 (two) times daily.  . tamsulosin (FLOMAX) 0.4 MG CAPS capsule Take 1 capsule (0.4 mg total) by mouth daily. (Patient taking differently: Take 0.4 mg by mouth daily after breakfast. )  . traMADol (ULTRAM) 50 MG tablet Take 1 tablet (50 mg total) by mouth every 6 (six) hours as needed for moderate pain.   No current facility-administered medications for this visit.  (Other)      REVIEW OF SYSTEMS: ROS    Positive for: Cardiovascular, Eyes   Negative for: Constitutional, Gastrointestinal, Neurological, Skin, Genitourinary, Musculoskeletal, HENT, Endocrine, Respiratory, Psychiatric, Allergic/Imm, Heme/Lymph   Last edited by Ernest Mallick  J, COT on 07/21/2017  8:34 AM. (History)       ALLERGIES Allergies  Allergen Reactions  . Statins Hives    Rash,whelps,liver enzymes elevated    PAST MEDICAL HISTORY Past Medical History:  Diagnosis Date  . Bladder cancer ([place])   . Dysuria   . History of kidney stones 2012   passed / has renal cyst to evaluated at later time  . Hypertension   . Lesion of bladder   . PONV (postoperative nausea and vomiting)     10 yrs ago  no problems with last 2 surgeries  . Wears glasses    Past Surgical History:  Procedure Laterality Date  . CYSTOSCOPY N/A 11/05/2016   Procedure: CYSTOSCOPY;  Surgeon: Ceasar Mons, MD;  Location: [facility];  Service: Urology;  Laterality: N/A;  . CYSTOSCOPY WITH BIOPSY Bilateral 10/24/2016   Procedure: CYSTOSCOPY WITH BLADDER BIOPSY/ BILATERAL RETROGRADE [id number];  Surgeon: Ceasar Mons, MD;  Location: [facility];  Service: Urology;  Laterality: Bilateral;  . 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 TRANSURETHRAL RESECTION OF BLADDER TUMOR N/A 11/05/2016   Procedure: TRANSURETHRAL RESECTION OF BLADDER TUMOR (TURBT);  Surgeon: Ceasar Mons, MD;  Location: [facility];  Service: Urology;  Laterality: N/A;    FAMILY HISTORY Family History  Problem Relation Age of Onset  . Blindness Mother   . Cataracts Mother   . Diabetes Mother   . Glaucoma Mother   . Macular degeneration Mother   . Cataracts Father   . Diabetes Father   . Diabetes Sister   . Diabetes Brother   . Blindness Maternal Uncle   . Macular degeneration Maternal Uncle   . Macular degeneration Paternal Aunt   . Amblyopia Neg Hx   . Retinal detachment Neg Hx   . Strabismus Neg Hx   . Retinitis pigmentosa Neg Hx     SOCIAL HISTORY Social History   Tobacco Use  . Smoking status: Former Smoker    Years: 28.00    Types: Cigarettes    Last attempt to quit: 10/22/1999    Years since quitting: 17.7  . Smokeless tobacco: Never Used  Substance Use Topics  . Alcohol use: Yes    Alcohol/week: 4.2 oz    Types: 7 Cans of beer per week  . Drug use: No         OPHTHALMIC EXAM:  Base Eye Exam    Visual Acuity (Snellen - Linear)      Right Left   Dist cc 20/20 20/20   Correction:  Glasses       Tonometry (Tonopen, 8:39 AM)       Right Left   Pressure 14 17       Pupils      Dark Light Shape React APD   Right 5 3 Round Brisk None   Left 5 3 Round Brisk None       Visual Fields (Counting fingers)      Left Right    Full Full       Extraocular Movement      Right Left    Full, Ortho Full, Ortho       Neuro/Psych    Oriented x3:  Yes   Mood/Affect:  Normal       Dilation    Both eyes:  1.0% Mydriacyl, 2.5% Phenylephrine @ 8:39 AM  Slit Lamp and Fundus Exam    Slit Lamp Exam      Right Left   Lids/Lashes Dermatochalasis - upper lid Dermatochalasis - upper lid   Conjunctiva/Sclera White and quiet White and quiet   Cornea Mild Anterior basement membrane dystrophy, mild Arcus Mild Arcus   Anterior Chamber Deep and quiet Deep and quiet   Iris Round and dilated Round and dilated   Lens 2+ Nuclear sclerosis, 2+ Cortical cataract 2-3+ Nuclear sclerosis, 2+ Cortical cataract   Vitreous Very mild Vitreous syneresis Very mild Vitreous syneresis       Fundus Exam      Right Left   Disc Pink and Sharp Pink and Sharp   C/D Ratio 0.3 0.35   Macula Flat, mild Retinal pigment epithelial mottling, No heme or edema Flat, mild Retinal pigment epithelial mottling, No heme or edema   Vessels Mild Copper wiring Mild Copper wiring   Periphery Attached, small blot hemorrhage at 0930, mild reticular degeneration Attached, mild reticular degeneration         Refraction    Wearing Rx      Sphere Cylinder Axis Add   Right [place] -0.50 115 +2.25   Left +0.75 -1.25 085 +2.25          IMAGING AND PROCEDURES  Imaging and Procedures for @TODAY @  OCT, Retina - OU - Both Eyes       Right Eye Quality was good. Central Foveal Thickness: 275. Progression has no prior data. Findings include normal foveal contour, no IRF, no SRF.   Left Eye Central Foveal Thickness: 282. Progression has no prior data. Findings include normal foveal contour, no IRF, no SRF.   Notes *Images captured and stored on  drive  Diagnosis / Impression:  NFP; no IRF/SRF OU  Clinical management:  See below  Abbreviations: NFP - Normal foveal profile. CME - cystoid macular edema. PED - pigment epithelial detachment. IRF - intraretinal fluid. SRF - subretinal fluid. EZ - ellipsoid zone. ERM - epiretinal membrane. ORA - outer retinal atrophy. ORT - outer retinal tubulation. 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 Retinal edema H35.81 OCT, Retina - OU - Both Eyes  3. Combined forms of age-related cataract of both eyes H25.813     1. Peripheral reticular degeneration OU - mild RPE changes without retinal tears or holes OU - monitor  2. No retinal edema on exam or OCT  3. Combined form age-related cataract OU - The symptoms of cataract, surgical options, and treatments and risks were discussed with patient. - discussed diagnosis and progression - not yet visually significant - monitor for now   Ophthalmic Meds Ordered this visit:  No orders of the defined types were placed in this encounter.      Return if symptoms worsen or fail to improve.  There are no Patient Instructions on file for this visit.   Explained the diagnoses, plan, and follow up with the patient and they expressed understanding.  Patient expressed understanding of the importance of proper follow up care.   This document serves as a record of services personally performed by Gardiner Sleeper, MD, PhD. It was created on their behalf by Ernest Mallick, OA, an ophthalmic assistant. The creation of this record is the provider's dictation and/or activities during the visit.  Electronically signed by: Ernest Mallick, OA  06.10.2019 1:16 PM   This document serves as a record of services personally performed by Gardiner Sleeper, MD, PhD. It was created on their behalf by Catha Brow, [place], a certified ophthalmic  assistant. The creation of this record is the provider's dictation and/or activities during the visit.  Electronically signed by: Catha Brow, [place]  06.11.19 1:16 PM   Gardiner Sleeper, M.D., Ph.D. Diseases & Surgery of the Retina and Vitreous [facility]  I have reviewed the above documentation for accuracy and completeness, and I agree with the above. Gardiner Sleeper, M.D., Ph.D. 07/21/17 1:18 PM     Abbreviations: M myopia (nearsighted); A astigmatism; H hyperopia (farsighted); P presbyopia; Mrx spectacle prescription;  CTL contact lenses; OD right eye; OS left eye; OU both eyes  XT exotropia; ET esotropia; PEK punctate epithelial keratitis; PEE punctate epithelial erosions; DES dry eye syndrome; MGD meibomian gland dysfunction; ATs artificial tears; PFAT's preservative free artificial tears; [place] nuclear sclerotic cataract; PSC posterior subcapsular cataract; ERM epi-retinal membrane; PVD posterior vitreous detachment; RD retinal detachment; DM diabetes mellitus; DR diabetic retinopathy; NPDR non-proliferative diabetic retinopathy; PDR proliferative diabetic retinopathy; CSME clinically significant macular edema; DME diabetic macular edema; dbh dot blot hemorrhages; CWS cotton wool spot; POAG primary open angle glaucoma; C/D cup-to-disc ratio; HVF humphrey visual field; GVF goldmann visual field; OCT optical coherence tomography; IOP intraocular pressure; BRVO Branch retinal vein occlusion; CRVO central retinal vein occlusion; CRAO central retinal artery occlusion; BRAO branch retinal artery occlusion; RT retinal tear; SB scleral buckle; PPV pars plana vitrectomy; VH Vitreous hemorrhage; PRP panretinal laser photocoagulation; IVK intravitreal kenalog; VMT vitreomacular traction; MH Macular hole;  NVD neovascularization of the disc; NVE neovascularization elsewhere; AREDS age related eye disease study; ARMD age related macular degeneration; POAG primary open angle glaucoma;  EBMD epithelial/anterior basement membrane dystrophy; ACIOL anterior chamber intraocular lens; IOL intraocular lens; PCIOL posterior chamber intraocular lens; Phaco/IOL phacoemulsification with intraocular lens placement; [place] photorefractive keratectomy; LASIK laser assisted in situ keratomileusis; HTN hypertension; DM diabetes mellitus; COPD chronic obstructive pulmonary disease

## 2017-07-21 ENCOUNTER — Ambulatory Visit (INDEPENDENT_AMBULATORY_CARE_PROVIDER_SITE_OTHER): Payer: Medicare Other | Admitting: Ophthalmology

## 2017-07-21 ENCOUNTER — Encounter (INDEPENDENT_AMBULATORY_CARE_PROVIDER_SITE_OTHER): Payer: Self-pay | Admitting: Ophthalmology

## 2017-07-21 DIAGNOSIS — H3581 Retinal edema: Secondary | ICD-10-CM

## 2017-07-21 DIAGNOSIS — H3540 Unspecified peripheral retinal degeneration: Secondary | ICD-10-CM

## 2017-07-21 DIAGNOSIS — H25813 Combined forms of age-related cataract, bilateral: Secondary | ICD-10-CM | POA: Diagnosis not present

## 2017-09-02 DIAGNOSIS — Z125 Encounter for screening for malignant neoplasm of prostate: Secondary | ICD-10-CM | POA: Diagnosis not present

## 2017-09-02 DIAGNOSIS — E7849 Other hyperlipidemia: Secondary | ICD-10-CM | POA: Diagnosis not present

## 2017-09-02 DIAGNOSIS — I10 Essential (primary) hypertension: Secondary | ICD-10-CM | POA: Diagnosis not present

## 2017-09-02 DIAGNOSIS — R82998 Other abnormal findings in urine: Secondary | ICD-10-CM | POA: Diagnosis not present

## 2017-09-11 DIAGNOSIS — E7849 Other hyperlipidemia: Secondary | ICD-10-CM | POA: Diagnosis not present

## 2017-09-11 DIAGNOSIS — N529 Male erectile dysfunction, unspecified: Secondary | ICD-10-CM | POA: Diagnosis not present

## 2017-09-11 DIAGNOSIS — Z0000 Encounter for general adult medical examination without abnormal findings: Secondary | ICD-10-CM | POA: Diagnosis not present

## 2017-09-11 DIAGNOSIS — R740 Nonspecific elevation of levels of transaminase and lactic acid dehydrogenase [LDH]: Secondary | ICD-10-CM | POA: Diagnosis not present

## 2017-09-11 DIAGNOSIS — C679 Malignant neoplasm of bladder, unspecified: Secondary | ICD-10-CM | POA: Diagnosis not present

## 2017-09-11 DIAGNOSIS — M67921 Unspecified disorder of synovium and tendon, right upper arm: Secondary | ICD-10-CM | POA: Diagnosis not present

## 2017-09-11 DIAGNOSIS — Z6827 Body mass index (BMI) 27.0-27.9, adult: Secondary | ICD-10-CM | POA: Diagnosis not present

## 2017-09-11 DIAGNOSIS — S22009S Unspecified fracture of unspecified thoracic vertebra, sequela: Secondary | ICD-10-CM | POA: Diagnosis not present

## 2017-09-11 DIAGNOSIS — H3540 Unspecified peripheral retinal degeneration: Secondary | ICD-10-CM | POA: Diagnosis not present

## 2017-09-11 DIAGNOSIS — N200 Calculus of kidney: Secondary | ICD-10-CM | POA: Diagnosis not present

## 2017-09-11 DIAGNOSIS — Z1389 Encounter for screening for other disorder: Secondary | ICD-10-CM | POA: Diagnosis not present

## 2017-09-11 DIAGNOSIS — I10 Essential (primary) hypertension: Secondary | ICD-10-CM | POA: Diagnosis not present

## 2017-09-16 DIAGNOSIS — Z1212 Encounter for screening for malignant neoplasm of rectum: Secondary | ICD-10-CM | POA: Diagnosis not present

## 2017-10-20 DIAGNOSIS — Z23 Encounter for immunization: Secondary | ICD-10-CM | POA: Diagnosis not present

## 2017-10-27 DIAGNOSIS — Z5111 Encounter for antineoplastic chemotherapy: Secondary | ICD-10-CM | POA: Diagnosis not present

## 2017-10-27 DIAGNOSIS — C672 Malignant neoplasm of lateral wall of bladder: Secondary | ICD-10-CM | POA: Diagnosis not present

## 2017-11-03 DIAGNOSIS — Z5111 Encounter for antineoplastic chemotherapy: Secondary | ICD-10-CM | POA: Diagnosis not present

## 2017-11-03 DIAGNOSIS — C672 Malignant neoplasm of lateral wall of bladder: Secondary | ICD-10-CM | POA: Diagnosis not present

## 2017-11-06 DIAGNOSIS — R825 Elevated urine levels of drugs, medicaments and biological substances: Secondary | ICD-10-CM | POA: Diagnosis not present

## 2017-11-10 DIAGNOSIS — Z5111 Encounter for antineoplastic chemotherapy: Secondary | ICD-10-CM | POA: Diagnosis not present

## 2017-11-10 DIAGNOSIS — C672 Malignant neoplasm of lateral wall of bladder: Secondary | ICD-10-CM | POA: Diagnosis not present

## 2017-11-19 DIAGNOSIS — C672 Malignant neoplasm of lateral wall of bladder: Secondary | ICD-10-CM | POA: Diagnosis not present

## 2018-04-15 DIAGNOSIS — C672 Malignant neoplasm of lateral wall of bladder: Secondary | ICD-10-CM | POA: Diagnosis not present

## 2018-05-07 ENCOUNTER — Other Ambulatory Visit: Payer: Self-pay | Admitting: Urology

## 2018-05-07 NOTE — Patient Instructions (Signed)
JABRE HEO  05/07/2018   Your procedure is scheduled on: 05/12/2018   Report to [facility] Main  Entrance  Report to admitting at    12 noon     Call this number if you have problems the morning of surgery [phone]   Remember: Do not eat food  :After Midnight. BRUSH YOUR TEETH MORNING OF SURGERY AND RINSE YOUR MOUTH OUT, NO CHEWING GUM CANDY OR MINTS.  May have clear liquids from 12 midnite until 0800am then nothing by mouth.     CLEAR LIQUID DIET   Foods Allowed                                                                     Foods Excluded  Coffee and tea, regular and decaf                             liquids that you cannot  Plain Jell-O in any flavor                                             see through such as: Fruit ices (not with fruit pulp)                                     milk, soups, orange juice  Iced Popsicles                                    All solid food Carbonated beverages, regular and diet                                    Cranberry, grape and apple juices Sports drinks like Gatorade Lightly seasoned clear broth or consume(fat free) Sugar, honey syrup  Sample Menu Breakfast                                Lunch                                     Supper Cranberry juice                    Beef broth                            Chicken broth Jell-O                                     Grape juice  Apple juice Coffee or tea                        Jell-O                                      Popsicle                                                Coffee or tea                        Coffee or tea  _____________________________________________________________________    Take these medicines the morning of surgery with A SIP OF WATER: Flonase if needed, Singulair, Flomax                                 You may not have any metal on your body including hair pins and              piercings  Do not  wear jewelry,  lotions, powders or perfumes, deodorant                       Men may shave face and neck.   Do not bring valuables to the hospital. [place].  Contacts, dentures or bridgework may not be worn into surgery.       Patients discharged the day of surgery will not be allowed to drive home. IF YOU ARE HAVING SURGERY AND GOING HOME THE SAME DAY, YOU MUST HAVE AN ADULT TO DRIVE YOU HOME AND BE WITH YOU FOR 24 HOURS. YOU MAY GO HOME BY TAXI OR UBER OR ORTHERWISE, BUT AN ADULT MUST ACCOMPANY YOU HOME AND STAY WITH YOU FOR 24 HOURS.  Name and phone number of your driver:                Please read over the following fact sheets you were given: _____________________________________________________________________             [facility] - Preparing for Surgery Before surgery, you can play an important role.  Because skin is not sterile, your skin needs to be as free of germs as possible.  You can reduce the number of germs on your skin by washing with CHG (chlorahexidine gluconate) soap before surgery.  CHG is an antiseptic cleaner which kills germs and bonds with the skin to continue killing germs even after washing. Please DO NOT use if you have an allergy to CHG or antibacterial soaps.  If your skin becomes reddened/irritated stop using the CHG and inform your nurse when you arrive at Short Stay. Do not shave (including legs and underarms) for at least 48 hours prior to the first CHG shower.  You may shave your face/neck. Please follow these instructions carefully:  1.  Shower with CHG Soap the night before surgery and the  morning of Surgery.  2.  If you choose to wash your hair, wash your hair first as usual with your  normal  shampoo.  3.  After you  shampoo, rinse your hair and body thoroughly to remove the  shampoo.                           4.  Use CHG as you would any other liquid soap.  You can apply chg directly  to the skin  and wash                       Gently with a scrungie or clean washcloth.  5.  Apply the CHG Soap to your body ONLY FROM THE NECK DOWN.   Do not use on face/ open                           Wound or open sores. Avoid contact with eyes, ears mouth and genitals (private parts).                       Wash face,  Genitals (private parts) with your normal soap.             6.  Wash thoroughly, paying special attention to the area where your surgery  will be performed.  7.  Thoroughly rinse your body with warm water from the neck down.  8.  DO NOT shower/wash with your normal soap after using and rinsing off  the CHG Soap.                9.  Pat yourself dry with a clean towel.            10.  Wear clean pajamas.            11.  Place clean sheets on your bed the night of your first shower and do not  sleep with pets. Day of Surgery : Do not apply any lotions/deodorants the morning of surgery.  Please wear clean clothes to the hospital/surgery center.  FAILURE TO FOLLOW THESE INSTRUCTIONS MAY RESULT IN THE CANCELLATION OF YOUR SURGERY PATIENT SIGNATURE_________________________________  NURSE SIGNATURE__________________________________  ________________________________________________________________________

## 2018-05-10 ENCOUNTER — Encounter (HOSPITAL_COMMUNITY)
Admission: RE | Admit: 2018-05-10 | Discharge: 2018-05-10 | Disposition: A | Discharge status: Home / Self Care | Payer: Medicare Other | Source: Ambulatory Visit | Attending: Urology | Admitting: Urology

## 2018-05-10 ENCOUNTER — Other Ambulatory Visit: Payer: Self-pay

## 2018-05-10 ENCOUNTER — Encounter (HOSPITAL_COMMUNITY): Payer: Self-pay

## 2018-05-10 DIAGNOSIS — Z87891 Personal history of nicotine dependence: Secondary | ICD-10-CM | POA: Diagnosis not present

## 2018-05-10 DIAGNOSIS — Z01818 Encounter for other preprocedural examination: Secondary | ICD-10-CM | POA: Insufficient documentation

## 2018-05-10 DIAGNOSIS — C679 Malignant neoplasm of bladder, unspecified: Secondary | ICD-10-CM | POA: Insufficient documentation

## 2018-05-10 DIAGNOSIS — Z888 Allergy status to other drugs, medicaments and biological substances status: Secondary | ICD-10-CM | POA: Diagnosis not present

## 2018-05-10 DIAGNOSIS — N329 Bladder disorder, unspecified: Secondary | ICD-10-CM | POA: Diagnosis present

## 2018-05-10 DIAGNOSIS — C672 Malignant neoplasm of lateral wall of bladder: Secondary | ICD-10-CM | POA: Diagnosis not present

## 2018-05-10 DIAGNOSIS — N3090 Cystitis, unspecified without hematuria: Secondary | ICD-10-CM | POA: Diagnosis not present

## 2018-05-10 DIAGNOSIS — Z79899 Other long term (current) drug therapy: Secondary | ICD-10-CM | POA: Diagnosis not present

## 2018-05-10 DIAGNOSIS — I10 Essential (primary) hypertension: Secondary | ICD-10-CM | POA: Diagnosis not present

## 2018-05-10 HISTORY — DX: Unspecified osteoarthritis, unspecified site: M19.90

## 2018-05-10 LAB — BASIC METABOLIC PANEL
Anion gap: 9 (ref 5–15)
BUN: 15 mg/dL (ref 8–23)
CO2: 26 mmol/L (ref 22–32)
Calcium: 9.3 mg/dL (ref 8.9–10.3)
Chloride: 104 mmol/L (ref 98–111)
Creatinine, Ser: 0.91 mg/dL (ref 0.61–1.24)
Glucose, Bld: 103 mg/dL — ABNORMAL HIGH (ref 70–99)
Potassium: 5.2 mmol/L — ABNORMAL HIGH (ref 3.5–5.1)
SODIUM: 139 mmol/L (ref 135–145)

## 2018-05-10 LAB — CBC
HCT: 51.1 % (ref 39.0–52.0)
Hemoglobin: 16.5 g/dL (ref 13.0–17.0)
MCH: 30 pg (ref 26.0–34.0)
MCHC: 32.3 g/dL (ref 30.0–36.0)
MCV: 92.9 fL (ref 80.0–100.0)
NRBC: 0 % (ref 0.0–0.2)
Platelets: 191 10*3/uL (ref 150–400)
RBC: 5.5 MIL/uL (ref 4.22–5.81)
RDW: 12.9 % (ref 11.5–15.5)
WBC: 6.2 10*3/uL (ref 4.0–10.5)

## 2018-05-10 NOTE — Progress Notes (Signed)
Anesthesia Chart Review   Case:  [id number] Date/Time:  05/12/18 1346   Procedure:  CYSTOSCOPY WITH BLADDER BIOPSY/ BILATERAL RETROGRADE PYELOGRAM BIOPSY (N/A )   Anesthesia type:  General   Pre-op diagnosis:  BLADDER CANCER   Location:  [facility]   Surgeon:  Ceasar Mons, MD      DISCUSSION:68 yo former smoker (quit 10/22/99) with h/o PONV, HTN, bladder cancer scheduled for above procedure 05/12/18 with Dr. Harrell Gave Lovena Neighbours.   Pt can proceed with planned procedure barring acute status change.  VS: BP (!) 145/77   Pulse 68   Temp 36.7 C (Oral)   Resp 16   Ht 5\' 10"  (1.778 m)   Wt 84.4 kg   SpO2 100%   BMI 26.69 kg/m   PROVIDERS: Reynold Bowen, MD is PCP    LABS: Labs reviewed: Acceptable for surgery. (all labs ordered are listed, but only abnormal results are displayed)  Labs Reviewed  BASIC METABOLIC PANEL - Abnormal; Notable for the following components:      Result Value   Potassium 5.2 (*)    Glucose, Bld 103 (*)    All other components within normal limits  CBC     IMAGES:   EKG: 05/10/18 Rate 66 bpm Normal sinus rhythm  Normal ECG   CV:  Past Medical History:  Diagnosis Date  . Arthritis   . Bladder cancer ([place])   . Dysuria   . History of kidney stones 2012   passed / has renal cyst to evaluated at later time  . Hypertension   . Lesion of bladder   . Pneumonia    hx of   . PONV (postoperative nausea and vomiting)    10 yrs ago  no problems with last 2 surgeries  . Wears glasses     Past Surgical History:  Procedure Laterality Date  . CYSTOSCOPY N/A 11/05/2016   Procedure: CYSTOSCOPY;  Surgeon: Ceasar Mons, MD;  Location: [facility];  Service: Urology;  Laterality: N/A;  . CYSTOSCOPY WITH BIOPSY Bilateral 10/24/2016   Procedure: CYSTOSCOPY WITH BLADDER BIOPSY/ BILATERAL RETROGRADE HFWYOVZC;  Surgeon: Ceasar Mons, MD;  Location: [facility];  Service:  Urology;  Laterality: Bilateral;  . 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 fattytumorremoved from back     . TRANSURETHRAL RESECTION OF BLADDER TUMOR N/A 11/05/2016   Procedure: TRANSURETHRAL RESECTION OF BLADDER TUMOR (TURBT);  Surgeon: Ceasar Mons, MD;  Location: [facility];  Service: Urology;  Laterality: N/A;    MEDICATIONS: . acetaminophen (TYLENOL) 500 MG tablet  . fluticasone (FLONASE) 50 MCG/ACT nasal spray  . HYDROcodone-acetaminophen (NORCO/VICODIN) 5-325 MG tablet  . lisinopril (PRINIVIL,ZESTRIL) 40 MG tablet  . montelukast (SINGULAIR) 10 MG tablet  . oxybutynin (DITROPAN) 5 MG tablet  . sulfamethoxazole-trimethoprim (BACTRIM DS,SEPTRA DS) 800-160 MG tablet  . tamsulosin (FLOMAX) 0.4 MG CAPS capsule   No current facility-administered medications for this encounter.      Maia Plan [facility] Pre-Surgical Testing [phone] 05/10/18 2:51 PM

## 2018-05-10 NOTE — Progress Notes (Signed)
AT preop appt patient denies any cough, fever, shortness of breath, runny nose or sore throat.  Patient has not traveled out of the state.

## 2018-05-10 NOTE — Progress Notes (Signed)
Final EKG done 05/10/18-epic

## 2018-05-12 ENCOUNTER — Other Ambulatory Visit: Payer: Self-pay

## 2018-05-12 ENCOUNTER — Encounter (HOSPITAL_COMMUNITY): Payer: Self-pay | Admitting: *Deleted

## 2018-05-12 ENCOUNTER — Ambulatory Visit (HOSPITAL_COMMUNITY): Payer: Medicare Other

## 2018-05-12 ENCOUNTER — Ambulatory Visit (HOSPITAL_COMMUNITY): Payer: Medicare Other | Admitting: Physician Assistant

## 2018-05-12 ENCOUNTER — Encounter (HOSPITAL_COMMUNITY)
Admission: RE | Disposition: A | Discharge status: Home / Self Care | Payer: Self-pay | Source: Ambulatory Visit | Attending: Urology

## 2018-05-12 ENCOUNTER — Ambulatory Visit (HOSPITAL_COMMUNITY): Payer: Medicare Other | Admitting: Certified Registered Nurse Anesthetist

## 2018-05-12 ENCOUNTER — Ambulatory Visit (HOSPITAL_COMMUNITY)
Admission: RE | Admit: 2018-05-12 | Discharge: 2018-05-12 | Disposition: A | Discharge status: Home / Self Care | Payer: Medicare Other | Source: Ambulatory Visit | Attending: Urology | Admitting: Urology

## 2018-05-12 DIAGNOSIS — N3090 Cystitis, unspecified without hematuria: Secondary | ICD-10-CM | POA: Diagnosis not present

## 2018-05-12 DIAGNOSIS — Z87891 Personal history of nicotine dependence: Secondary | ICD-10-CM | POA: Diagnosis not present

## 2018-05-12 DIAGNOSIS — I10 Essential (primary) hypertension: Secondary | ICD-10-CM | POA: Insufficient documentation

## 2018-05-12 DIAGNOSIS — N3289 Other specified disorders of bladder: Secondary | ICD-10-CM | POA: Diagnosis not present

## 2018-05-12 DIAGNOSIS — Z888 Allergy status to other drugs, medicaments and biological substances status: Secondary | ICD-10-CM | POA: Diagnosis not present

## 2018-05-12 DIAGNOSIS — Z79899 Other long term (current) drug therapy: Secondary | ICD-10-CM | POA: Insufficient documentation

## 2018-05-12 DIAGNOSIS — C672 Malignant neoplasm of lateral wall of bladder: Secondary | ICD-10-CM | POA: Diagnosis not present

## 2018-05-12 DIAGNOSIS — D494 Neoplasm of unspecified behavior of bladder: Secondary | ICD-10-CM | POA: Diagnosis not present

## 2018-05-12 HISTORY — PX: CYSTOSCOPY WITH BIOPSY: SHX5122

## 2018-05-12 SURGERY — CYSTOSCOPY, WITH BIOPSY
Anesthesia: General

## 2018-05-12 MED ORDER — DEXAMETHASONE SODIUM PHOSPHATE 10 MG/ML IJ SOLN
INTRAMUSCULAR | Status: DC | PRN
  Administered 2018-05-12: 10 mg via INTRAVENOUS

## 2018-05-12 MED ORDER — FENTANYL CITRATE (PF) 100 MCG/2ML IJ SOLN
INTRAMUSCULAR | Status: AC
  Administered 2018-05-12: 50 ug via INTRAVENOUS
  Filled 2018-05-12: qty 2

## 2018-05-12 MED ORDER — LIDOCAINE 2% (20 MG/ML) 5 ML SYRINGE
INTRAMUSCULAR | Status: DC | PRN
  Administered 2018-05-12: 100 mg via INTRAVENOUS

## 2018-05-12 MED ORDER — ACETAMINOPHEN 500 MG PO TABS
1000.0000 mg | ORAL_TABLET | Freq: Once | ORAL | Status: AC
  Administered 2018-05-12: 1000 mg via ORAL

## 2018-05-12 MED ORDER — IOHEXOL 300 MG/ML  SOLN
INTRAMUSCULAR | Status: DC | PRN
  Administered 2018-05-12: 13:00:00 11 mL

## 2018-05-12 MED ORDER — SULFAMETHOXAZOLE-TRIMETHOPRIM 800-160 MG PO TABS: 1.0000 | ORAL_TABLET | Freq: Two times a day (BID) | ORAL | 0 refills | Status: DC

## 2018-05-12 MED ORDER — EPHEDRINE SULFATE-NACL 50-0.9 MG/10ML-% IV SOSY
PREFILLED_SYRINGE | INTRAVENOUS | Status: DC | PRN
  Administered 2018-05-12: 10 mg via INTRAVENOUS

## 2018-05-12 MED ORDER — DEXAMETHASONE SODIUM PHOSPHATE 10 MG/ML IJ SOLN
INTRAMUSCULAR | Status: AC
  Filled 2018-05-12: qty 1

## 2018-05-12 MED ORDER — FENTANYL CITRATE (PF) 100 MCG/2ML IJ SOLN
INTRAMUSCULAR | Status: DC | PRN
  Administered 2018-05-12 (×4): 25 ug via INTRAVENOUS

## 2018-05-12 MED ORDER — ACETAMINOPHEN 500 MG PO TABS
ORAL_TABLET | ORAL | Status: AC
  Filled 2018-05-12: qty 2

## 2018-05-12 MED ORDER — FENTANYL CITRATE (PF) 100 MCG/2ML IJ SOLN
25.0000 ug | INTRAMUSCULAR | Status: DC | PRN
  Administered 2018-05-12: 14:00:00 50 ug via INTRAVENOUS

## 2018-05-12 MED ORDER — STERILE WATER FOR IRRIGATION IR SOLN
Status: DC | PRN
  Administered 2018-05-12: 3000 mL

## 2018-05-12 MED ORDER — MIDAZOLAM HCL 2 MG/2ML IJ SOLN
INTRAMUSCULAR | Status: AC
  Filled 2018-05-12: qty 2

## 2018-05-12 MED ORDER — FENTANYL CITRATE (PF) 100 MCG/2ML IJ SOLN
INTRAMUSCULAR | Status: AC
  Filled 2018-05-12: qty 2

## 2018-05-12 MED ORDER — KETOROLAC TROMETHAMINE 15 MG/ML IJ SOLN: 15.0000 mg | Freq: Once | INTRAMUSCULAR | Status: DC | PRN

## 2018-05-12 MED ORDER — LACTATED RINGERS IV SOLN
INTRAVENOUS | Status: DC
  Administered 2018-05-12: 12:00:00 via INTRAVENOUS

## 2018-05-12 MED ORDER — PROPOFOL 10 MG/ML IV BOLUS
INTRAVENOUS | Status: AC
  Filled 2018-05-12: qty 20

## 2018-05-12 MED ORDER — OXYBUTYNIN CHLORIDE 5 MG PO TABS: 5.0000 mg | ORAL_TABLET | Freq: Three times a day (TID) | ORAL | 0 refills | Status: DC | PRN

## 2018-05-12 MED ORDER — ONDANSETRON HCL 4 MG/2ML IJ SOLN: 4.0000 mg | Freq: Once | INTRAMUSCULAR | Status: DC | PRN

## 2018-05-12 MED ORDER — PHENAZOPYRIDINE HCL 200 MG PO TABS: 200.0000 mg | ORAL_TABLET | Freq: Three times a day (TID) | ORAL | 0 refills | Status: DC | PRN

## 2018-05-12 MED ORDER — MIDAZOLAM HCL 5 MG/5ML IJ SOLN
INTRAMUSCULAR | Status: DC | PRN
  Administered 2018-05-12: 1 mg via INTRAVENOUS

## 2018-05-12 MED ORDER — PROPOFOL 10 MG/ML IV BOLUS
INTRAVENOUS | Status: DC | PRN
  Administered 2018-05-12: 200 mg via INTRAVENOUS

## 2018-05-12 MED ORDER — ONDANSETRON HCL 4 MG/2ML IJ SOLN
INTRAMUSCULAR | Status: AC
  Filled 2018-05-12: qty 2

## 2018-05-12 MED ORDER — CEFAZOLIN SODIUM-DEXTROSE 2-4 GM/100ML-% IV SOLN
2.0000 g | Freq: Once | INTRAVENOUS | Status: AC
  Administered 2018-05-12: 13:00:00 2 g via INTRAVENOUS
  Filled 2018-05-12: qty 100

## 2018-05-12 MED ORDER — ONDANSETRON HCL 4 MG/2ML IJ SOLN
INTRAMUSCULAR | Status: DC | PRN
  Administered 2018-05-12: 4 mg via INTRAVENOUS

## 2018-05-12 SURGICAL SUPPLY — 16 items
BAG URINE DRAINAGE (UROLOGICAL SUPPLIES) IMPLANT
BAG URO CATCHER STRL LF (MISCELLANEOUS) ×3 IMPLANT
COVER WAND RF STERILE (DRAPES) IMPLANT
ELECT REM PT RETURN 15FT ADLT (MISCELLANEOUS) ×3 IMPLANT
GLOVE BIOGEL M STRL SZ7.5 (GLOVE) ×3 IMPLANT
GOWN STRL REUS W/TWL LRG LVL3 (GOWN DISPOSABLE) ×3 IMPLANT
GOWN STRL REUS W/TWL XL LVL3 (GOWN DISPOSABLE) ×3 IMPLANT
KIT TURNOVER KIT A (KITS) IMPLANT
LOOP CUT BIPOLAR 24F LRG (ELECTROSURGICAL) IMPLANT
MANIFOLD NEPTUNE II (INSTRUMENTS) ×3 IMPLANT
PACK CYSTO (CUSTOM PROCEDURE TRAY) ×3 IMPLANT
SET ASPIRATION TUBING (TUBING) IMPLANT
SYRINGE IRR TOOMEY STRL 70CC (SYRINGE) IMPLANT
TUBING CONNECTING 10 (TUBING) ×2 IMPLANT
TUBING CONNECTING 10' (TUBING) ×1
TUBING UROLOGY SET (TUBING) ×3 IMPLANT

## 2018-05-12 NOTE — H&P (Signed)
PRE-OP H&P  CC: bladder cancer   HPI: Shaun Ball is a 68 year old male with a history of CIS of the bladder (diagnosed 10/2016) and is s/p induction BCG and 2 rounds of maintenance BCG therapy.   Last BCG was 11/10/17   He is here today for post-BCG cystoscopy. Doing well from a voiding standpoint. Denies interval UTIs, dysuria or hematuria.   -He is grieving the loss of his daughter, who passed away 2 weeks ago.     ALLERGIES: Statin Drugs - Skin Rash, Hives    MEDICATIONS: Lisinopril 40 mg tablet  Hemp Oil     GU PSH: Bladder Instill AntiCA Agent - 11/10/2017, 11/03/2017, 10/27/2017, 06/16/2017, 06/09/2017, 06/02/2017, 02/04/2017, 01/27/2017, 01/20/2017, 01/13/2017, 01/06/2017, 12/30/2016 Cysto Bladder Ureth Biopsy - 10/24/2016 Cystoscopy - 11/19/2017, 07/02/2017, 02/20/2017, 10/07/2016 Cystoscopy TURBT 2-5 cm - 11/05/2016 Locm 300-399Mg /Ml Iodine,1Ml - 10/01/2016    NON-GU PSH: Back Surgery (Unspecified) - 1982 Foot surgery (unspecified) - 2010    GU PMH: Bladder Cancer Lateral - 01/06/2017 Acute Cystitis/UTI - 10/14/2016 Bladder disorder, Unspec, bladder lesion - 10/14/2016 Renal cyst, bilateral renal cysts - 10/14/2016 Microscopic hematuria - 09/23/2016 Nocturia - 09/23/2016    NON-GU PMH: Depression Heartburn Hypercholesterolemia Hypertension    FAMILY HISTORY: 1 Daughter - Other 1 son - Other father deceased - Other Kidney Stones - Runs in Family patient's mother is still living - Other    Notes: Mother is 23  Father died from a stroke/heart complications at 75   SOCIAL HISTORY: Marital Status: Single Current Smoking Status: Patient does not smoke anymore. Has not smoked since 09/11/1999.   Tobacco Use Assessment Completed: Used Tobacco in last 30 days? Drinks 5 drinks per day.  Does not drink caffeine.    REVIEW OF SYSTEMS:    GU Review Male:   Patient denies frequent urination, hard to postpone urination, burning/ pain with urination, get up at night to urinate, leakage  of urine, stream starts and stops, trouble starting your stream, have to strain to urinate , erection problems, and penile pain.  Gastrointestinal (Upper):   Patient denies nausea, vomiting, and indigestion/ heartburn.  Gastrointestinal (Lower):   Patient denies diarrhea and constipation.  Constitutional:   Patient reports night sweats. Patient denies fever, weight loss, and fatigue.  Skin:   Patient reports skin rash/ lesion and itching.   Eyes:   Patient denies blurred vision and double vision.  Ears/ Nose/ Throat:   Patient denies sore throat and sinus problems.  Hematologic/Lymphatic:   Patient denies swollen glands and easy bruising.  Cardiovascular:   Patient denies leg swelling and chest pains.  Respiratory:   Patient denies cough and shortness of breath.  Endocrine:   Patient denies excessive thirst.  Musculoskeletal:   Patient reports back pain. Patient denies joint pain.  Neurological:   Patient denies dizziness and headaches.  Psychologic:   Patient denies depression and anxiety.   VITAL SIGNS:      04/15/2018 09:55 AM  Weight 180 lb / 81.65 kg  Height 71 in / 180.34 cm  BP 149/83 mmHg  Heart Rate 72 /min  BMI 25.1 kg/m   GU PHYSICAL EXAMINATION:    Anus and Perineum: No hemorrhoids. No anal stenosis. No rectal fissure, no anal fissure. No edema, no dimple, no perineal tenderness, no anal tenderness.  Scrotum: No lesions. No edema. No cysts. No warts.  Epididymides: Right: no spermatocele, no masses, no cysts, no tenderness, no induration, no enlargement. Left: no spermatocele, no masses, no cysts, no  tenderness, no induration, no enlargement.  Testes: No tenderness, no swelling, no enlargement left testes. No tenderness, no swelling, no enlargement right testes. Normal location left testes. Normal location right testes. No mass, no cyst, no varicocele, no hydrocele left testes. No mass, no cyst, no varicocele, no hydrocele right testes.  Urethral Meatus: Normal size. No  lesion, no wart, no discharge, no polyp. Normal location.  Penis: Circumcised, no warts, no cracks. No dorsal Peyronie's plaques, no left corporal Peyronie's plaques, no right corporal Peyronie's plaques, no scarring, no warts. No balanitis, no meatal stenosis.  Prostate: 40 gram or 2+ size. Left lobe normal consistency, right lobe normal consistency. Symmetrical lobes. No prostate nodule. Left lobe no tenderness, right lobe no tenderness.  Seminal Vesicles: Nonpalpable.  Sphincter Tone: Normal sphincter. No rectal tenderness. No rectal mass.    MULTI-SYSTEM PHYSICAL EXAMINATION:    Constitutional: Well-nourished. No physical deformities. Normally developed. Good grooming.  Neck: Neck symmetrical, not swollen. Normal tracheal position.  Respiratory: No labored breathing, no use of accessory muscles.   Cardiovascular: Normal temperature, normal extremity pulses, no swelling, no varicosities.  Lymphatic: No enlargement of neck, axillae, groin.  Skin: No paleness, no jaundice, no cyanosis. No lesion, no ulcer, no rash.  Neurologic / Psychiatric: Oriented to time, oriented to place, oriented to person. No depression, no anxiety, no agitation.  Gastrointestinal: No mass, no tenderness, no rigidity, non obese abdomen.  Eyes: Normal conjunctivae. Normal eyelids.  Ears, Nose, Mouth, and Throat: Left ear no scars, no lesions, no masses. Right ear no scars, no lesions, no masses. Nose no scars, no lesions, no masses. Normal hearing. Normal lips.  Musculoskeletal: Normal gait and station of head and neck.     PAST DATA REVIEWED:  Source Of History:  Patient   PROCEDURES:         Flexible Cystoscopy - 52000  Risks, benefits, and some of the potential complications of the procedure were discussed at length with the patient including infection, bleeding, voiding discomfort, urinary retention, fever, chills, sepsis, and others. All questions were answered. Informed consent was obtained. Antibiotic  prophylaxis was given. Sterile technique and intraurethral analgesia were used.  Meatus:  Normal size. Normal location. Normal condition.  Urethra:  No strictures.  External Sphincter:  Normal.  Verumontanum:  Normal.  Prostate:  Non-obstructing. No hyperplasia.  Bladder Neck:  Non-obstructing.  Ureteral Orifices:  Normal location. Normal size. Normal shape. Effluxed clear urine.  Bladder:  < 1/2 cm tumor. Cysto today showed 2 patchy, erthematous lesions involving his prior biopsy site along the rightward posterior bladder wall concerning recurrent CIS vs inflammation      The lower urinary tract was carefully examined. The procedure was well-tolerated and without complications. Antibiotic instructions were given. Instructions were given to call the office immediately for bloody urine, difficulty urinating, urinary retention, painful or frequent urination, fever, chills, nausea, vomiting or other illness. The patient stated that he understood these instructions and would comply with them.         Urinalysis Dipstick Dipstick Cont'd  Color: Yellow Bilirubin: Neg mg/dL  Appearance: Clear Ketones: Neg mg/dL  Specific Gravity: 1.025 Blood: Neg ery/uL  pH: <=5.0 Protein: Neg mg/dL  Glucose: Neg mg/dL Urobilinogen: 0.2 mg/dL    Nitrites: Neg    Leukocyte Esterase: Neg leu/uL    ASSESSMENT:      ICD-10 Details  1 GU:   Bladder Cancer Lateral - C67.2    PLAN:           Orders  Labs Urine Cytology          Schedule Return Visit/Planned Activity: ASAP - Schedule Surgery          Document Letter(s):  Created for Annie Main A. [place], MD   Created for Patient: Clinical Summary         Notes:   -Cysto today showed 2 patchy, erythematous lesions that measure < 1 cm each, involving his prior biopsy site along the rightward posterior bladder wall concerning recurrent CIS vs inflammation  -The risks, benefits and alternatives of cystoscopy with bladder biopsy was discussed with the patient.  The risks include, but are not limited to, bleeding, urinary tract infection, bladder perforation requiring prolonged catheterization and/or open bladder repair, ureteral obstruction, voiding dysfunction and the inherent risks of general anesthesia. The patient voices understanding and wishes to proceed.

## 2018-05-12 NOTE — Transfer of Care (Signed)
Immediate Anesthesia Transfer of Care Note  Patient: Shaun Ball  Procedure(s) Performed: CYSTOSCOPY WITH BLADDER BIOPSY/ BILATERAL RETROGRADE PYELOGRAM BIOPSY (N/A )  Patient Location: PACU  Anesthesia Type:General  Level of Consciousness: awake, alert  and patient cooperative  Airway & Oxygen Therapy: Patient Spontanous Breathing and Patient connected to face mask oxygen  Post-op Assessment: Report given to RN and Post -op Vital signs reviewed and stable  Post vital signs: Reviewed and stable  Last Vitals:  Vitals Value Taken Time  BP    Temp    Pulse    Resp 12 05/12/2018  1:26 PM  SpO2    Vitals shown include unvalidated device data.  Last Pain:  Vitals:   05/12/18 1121  TempSrc: Oral      Patients Stated Pain Goal: 3 (49/20/10 0712)  Complications: No apparent anesthesia complications

## 2018-05-12 NOTE — Anesthesia Postprocedure Evaluation (Signed)
Anesthesia Post Note  Patient: Shaun Ball  Procedure(s) Performed: CYSTOSCOPY WITH BLADDER BIOPSY/ BILATERAL RETROGRADE PYELOGRAM BIOPSY (N/A )     Patient location during evaluation: PACU Anesthesia Type: General Level of consciousness: awake and alert Pain management: pain level controlled Vital Signs Assessment: post-procedure vital signs reviewed and stable Respiratory status: spontaneous breathing, nonlabored ventilation, respiratory function stable and patient connected to nasal cannula oxygen Cardiovascular status: blood pressure returned to baseline and stable Postop Assessment: no apparent nausea or vomiting Anesthetic complications: no    Last Vitals:  Vitals:   05/12/18 1400 05/12/18 1415  BP: 121/77 134/78  Pulse: 80 82  Resp: 20 13  Temp: (!) 36.4 C   SpO2: 96% 98%    Last Pain:  Vitals:   05/12/18 1415  TempSrc:   PainSc: 0-No pain                 Valen Gillison P Kristalyn Bergstresser

## 2018-05-12 NOTE — Op Note (Signed)
Operative Note  Preoperative diagnosis:  1.  Erythematous posterior bladder wall lesion 2.  History of CIS of the bladder  Postoperative diagnosis: Same  Procedure(s): 1.  Cystoscopy with bladder biopsy and fulguration 2.  Bilateral retrograde pyelograms with intraoperative interpretation of fluoroscopic imaging  Surgeon: [name], MD  Assistants:  None  Anesthesia:  General  Complications:  None  EBL: Less than 5 mL  Specimens: 1.  Posterior bladder wall lesion  Drains/Catheters: 1.  None  Intraoperative findings:   1. 1 cm erythematous lesion involving the prior bladder biopsy site involving the posterior bladder wall 2. Solitary left collecting system with no filling defects or dilation involving the left ureter or left renal pelvis seen on retrograde pyelogram 3. Solitary right collecting system with no filling defects or dilation involving the right ureter or right renal pelvis seen on retrograde pyelogram  Indication:  Shaun Ball is a 68 y.o. male with a history of CIS of the bladder, diagnosed in September 2018 and is status post induction BCG with 2 rounds of maintenance BCG therapy.  On surveillance cystoscopy, the patient was found to have a 1 cm erythematous lesion involving the prior biopsy site on the posterior bladder wall.  He has been consented for the above procedures, voices understanding and wished to proceed.  Description of procedure:  After informed consent was obtained, the patient was brought to the operating room and general LMA anesthesia was administered. The patient was then placed in the dorsolithotomy position and prepped and draped in the usual sterile fashion. A timeout was performed. A 23 French rigid cystoscope was then inserted into the urethral meatus and advanced into the bladder under direct vision. A complete bladder survey revealed no intravesical pathology in addition to the 1 cm erythematous lesion involving the posterior  bladder wall.  I then proceeded to take 3 biopsies of the lesion in question, which were sent for permanent section.  The area that was biopsied was then extensively fulgurated with the [place] until hemostasis was achieved.  Bilateral retrograde pyelograms were obtained, with the findings listed above.  The patient's bladder was drained.  He tolerated the procedure well and was transferred to the postanesthesia in stable condition.  Plan: I will have a telehealth visit in approximately 2 weeks to discuss his pathology results.

## 2018-05-12 NOTE — Anesthesia Procedure Notes (Addendum)
Procedure Name: LMA Insertion Date/Time: 05/12/2018 12:39 PM Performed by: West Pugh, CRNA Pre-anesthesia Checklist: Patient identified, Emergency Drugs available, Suction available, Patient being monitored and Timeout performed Patient Re-evaluated:Patient Re-evaluated prior to induction Oxygen Delivery Method: Circle system utilized Preoxygenation: Pre-oxygenation with 100% oxygen Induction Type: IV induction LMA: LMA inserted LMA Size: 5.0 Number of attempts: 1 Placement Confirmation: positive ETCO2 and breath sounds checked- equal and bilateral Tube secured with: Tape Dental Injury: Teeth and Oropharynx as per pre-operative assessment

## 2018-05-12 NOTE — Anesthesia Preprocedure Evaluation (Addendum)
Anesthesia Evaluation  Patient identified by MRN, date of birth, ID band Patient awake    Reviewed: Allergy & Precautions, NPO status , Patient's Chart, lab work & pertinent test results  History of Anesthesia Complications (+) PONV and history of anesthetic complications  Airway Mallampati: II  TM Distance: >3 FB Neck ROM: Full    Dental  (+) Chipped,    Pulmonary former smoker,    Pulmonary exam normal breath sounds clear to auscultation       Cardiovascular hypertension, Pt. on medications Normal cardiovascular exam Rhythm:Regular Rate:Normal  ECG: NSR, rate 66   Neuro/Psych negative neurological ROS  negative psych ROS   GI/Hepatic negative GI ROS, Neg liver ROS,   Endo/Other  negative endocrine ROS  Renal/GU negative Renal ROS     Musculoskeletal negative musculoskeletal ROS (+)   Abdominal   Peds  Hematology negative hematology ROS (+)   Anesthesia Other Findings BLADDER CANCER  Reproductive/Obstetrics                            Anesthesia Physical Anesthesia Plan  ASA: III  Anesthesia Plan: General   Post-op Pain Management:    Induction: Intravenous  PONV Risk Score and Plan: 3 and Ondansetron, Dexamethasone, Midazolam and Treatment may vary due to age or medical condition  Airway Management Planned: LMA  Additional Equipment:   Intra-op Plan:   Post-operative Plan: Extubation in OR  Informed Consent: I have reviewed the patients History and Physical, chart, labs and discussed the procedure including the risks, benefits and alternatives for the proposed anesthesia with the patient or authorized representative who has indicated his/her understanding and acceptance.     Dental advisory given  Plan Discussed with: CRNA  Anesthesia Plan Comments:         Anesthesia Quick Evaluation

## 2018-05-13 ENCOUNTER — Encounter (HOSPITAL_COMMUNITY): Payer: Self-pay | Admitting: Urology

## 2018-08-24 DIAGNOSIS — C672 Malignant neoplasm of lateral wall of bladder: Secondary | ICD-10-CM | POA: Diagnosis not present

## 2018-09-08 DIAGNOSIS — I10 Essential (primary) hypertension: Secondary | ICD-10-CM | POA: Diagnosis not present

## 2018-09-08 DIAGNOSIS — R82998 Other abnormal findings in urine: Secondary | ICD-10-CM | POA: Diagnosis not present

## 2018-09-08 DIAGNOSIS — E7849 Other hyperlipidemia: Secondary | ICD-10-CM | POA: Diagnosis not present

## 2018-09-11 DIAGNOSIS — I639 Cerebral infarction, unspecified: Secondary | ICD-10-CM

## 2018-09-11 HISTORY — DX: Cerebral infarction, unspecified: I63.9

## 2018-09-22 DIAGNOSIS — N281 Cyst of kidney, acquired: Secondary | ICD-10-CM | POA: Diagnosis not present

## 2018-09-22 DIAGNOSIS — E785 Hyperlipidemia, unspecified: Secondary | ICD-10-CM | POA: Diagnosis not present

## 2018-09-22 DIAGNOSIS — H409 Unspecified glaucoma: Secondary | ICD-10-CM | POA: Diagnosis not present

## 2018-09-22 DIAGNOSIS — N200 Calculus of kidney: Secondary | ICD-10-CM | POA: Diagnosis not present

## 2018-09-22 DIAGNOSIS — Z1331 Encounter for screening for depression: Secondary | ICD-10-CM | POA: Diagnosis not present

## 2018-09-22 DIAGNOSIS — Z0000 Encounter for general adult medical examination without abnormal findings: Secondary | ICD-10-CM | POA: Diagnosis not present

## 2018-09-22 DIAGNOSIS — I700 Atherosclerosis of aorta: Secondary | ICD-10-CM | POA: Diagnosis not present

## 2018-09-22 DIAGNOSIS — R0602 Shortness of breath: Secondary | ICD-10-CM | POA: Diagnosis not present

## 2018-09-22 DIAGNOSIS — K760 Fatty (change of) liver, not elsewhere classified: Secondary | ICD-10-CM | POA: Diagnosis not present

## 2018-09-22 DIAGNOSIS — I10 Essential (primary) hypertension: Secondary | ICD-10-CM | POA: Diagnosis not present

## 2018-09-22 DIAGNOSIS — Z1339 Encounter for screening examination for other mental health and behavioral disorders: Secondary | ICD-10-CM | POA: Diagnosis not present

## 2018-09-22 DIAGNOSIS — F4321 Adjustment disorder with depressed mood: Secondary | ICD-10-CM | POA: Diagnosis not present

## 2018-09-22 DIAGNOSIS — C679 Malignant neoplasm of bladder, unspecified: Secondary | ICD-10-CM | POA: Diagnosis not present

## 2018-09-28 ENCOUNTER — Encounter (HOSPITAL_COMMUNITY): Payer: Self-pay | Admitting: Emergency Medicine

## 2018-09-28 ENCOUNTER — Other Ambulatory Visit: Payer: Self-pay

## 2018-09-28 ENCOUNTER — Emergency Department (HOSPITAL_COMMUNITY): Payer: Medicare Other

## 2018-09-28 ENCOUNTER — Inpatient Hospital Stay (HOSPITAL_COMMUNITY)
Admission: EM | Admit: 2018-09-28 | Discharge: 2018-10-02 | DRG: 042 | Disposition: A | Discharge status: Home / Self Care | Payer: Medicare Other | Attending: Internal Medicine | Admitting: Internal Medicine

## 2018-09-28 DIAGNOSIS — I10 Essential (primary) hypertension: Secondary | ICD-10-CM | POA: Diagnosis not present

## 2018-09-28 DIAGNOSIS — I739 Peripheral vascular disease, unspecified: Secondary | ICD-10-CM | POA: Diagnosis present

## 2018-09-28 DIAGNOSIS — H547 Unspecified visual loss: Secondary | ICD-10-CM | POA: Diagnosis present

## 2018-09-28 DIAGNOSIS — I63233 Cerebral infarction due to unspecified occlusion or stenosis of bilateral carotid arteries: Secondary | ICD-10-CM | POA: Diagnosis not present

## 2018-09-28 DIAGNOSIS — Z823 Family history of stroke: Secondary | ICD-10-CM

## 2018-09-28 DIAGNOSIS — Z833 Family history of diabetes mellitus: Secondary | ICD-10-CM | POA: Diagnosis not present

## 2018-09-28 DIAGNOSIS — Z83511 Family history of glaucoma: Secondary | ICD-10-CM | POA: Diagnosis not present

## 2018-09-28 DIAGNOSIS — R410 Disorientation, unspecified: Secondary | ICD-10-CM | POA: Diagnosis present

## 2018-09-28 DIAGNOSIS — R233 Spontaneous ecchymoses: Secondary | ICD-10-CM | POA: Diagnosis present

## 2018-09-28 DIAGNOSIS — Z79899 Other long term (current) drug therapy: Secondary | ICD-10-CM

## 2018-09-28 DIAGNOSIS — R109 Unspecified abdominal pain: Secondary | ICD-10-CM | POA: Diagnosis present

## 2018-09-28 DIAGNOSIS — Z8551 Personal history of malignant neoplasm of bladder: Secondary | ICD-10-CM

## 2018-09-28 DIAGNOSIS — M48061 Spinal stenosis, lumbar region without neurogenic claudication: Secondary | ICD-10-CM | POA: Diagnosis not present

## 2018-09-28 DIAGNOSIS — Z87891 Personal history of nicotine dependence: Secondary | ICD-10-CM | POA: Diagnosis not present

## 2018-09-28 DIAGNOSIS — N400 Enlarged prostate without lower urinary tract symptoms: Secondary | ICD-10-CM | POA: Diagnosis present

## 2018-09-28 DIAGNOSIS — Z821 Family history of blindness and visual loss: Secondary | ICD-10-CM | POA: Diagnosis not present

## 2018-09-28 DIAGNOSIS — I639 Cerebral infarction, unspecified: Secondary | ICD-10-CM | POA: Diagnosis not present

## 2018-09-28 DIAGNOSIS — Z87442 Personal history of urinary calculi: Secondary | ICD-10-CM | POA: Diagnosis not present

## 2018-09-28 DIAGNOSIS — Z792 Long term (current) use of antibiotics: Secondary | ICD-10-CM

## 2018-09-28 DIAGNOSIS — R4020 Unspecified coma: Secondary | ICD-10-CM | POA: Diagnosis not present

## 2018-09-28 DIAGNOSIS — Z20828 Contact with and (suspected) exposure to other viral communicable diseases: Secondary | ICD-10-CM | POA: Diagnosis not present

## 2018-09-28 DIAGNOSIS — M1990 Unspecified osteoarthritis, unspecified site: Secondary | ICD-10-CM | POA: Diagnosis present

## 2018-09-28 DIAGNOSIS — I6389 Other cerebral infarction: Secondary | ICD-10-CM | POA: Diagnosis not present

## 2018-09-28 DIAGNOSIS — K760 Fatty (change of) liver, not elsewhere classified: Secondary | ICD-10-CM | POA: Diagnosis not present

## 2018-09-28 DIAGNOSIS — I340 Nonrheumatic mitral (valve) insufficiency: Secondary | ICD-10-CM | POA: Diagnosis not present

## 2018-09-28 DIAGNOSIS — I6521 Occlusion and stenosis of right carotid artery: Secondary | ICD-10-CM | POA: Diagnosis present

## 2018-09-28 DIAGNOSIS — R29700 NIHSS score 0: Secondary | ICD-10-CM | POA: Diagnosis not present

## 2018-09-28 DIAGNOSIS — Z888 Allergy status to other drugs, medicaments and biological substances status: Secondary | ICD-10-CM

## 2018-09-28 DIAGNOSIS — R945 Abnormal results of liver function studies: Secondary | ICD-10-CM | POA: Diagnosis not present

## 2018-09-28 DIAGNOSIS — I63411 Cerebral infarction due to embolism of right middle cerebral artery: Secondary | ICD-10-CM

## 2018-09-28 DIAGNOSIS — E785 Hyperlipidemia, unspecified: Secondary | ICD-10-CM | POA: Diagnosis present

## 2018-09-28 LAB — CBC
HCT: 50.8 % (ref 39.0–52.0)
Hemoglobin: 16.8 g/dL (ref 13.0–17.0)
MCH: 30.4 pg (ref 26.0–34.0)
MCHC: 33.1 g/dL (ref 30.0–36.0)
MCV: 91.9 fL (ref 80.0–100.0)
Platelets: 192 10*3/uL (ref 150–400)
RBC: 5.53 MIL/uL (ref 4.22–5.81)
RDW: 12.6 % (ref 11.5–15.5)
WBC: 10.1 10*3/uL (ref 4.0–10.5)
nRBC: 0 % (ref 0.0–0.2)

## 2018-09-28 LAB — URINALYSIS, ROUTINE W REFLEX MICROSCOPIC
Bacteria, UA: NONE SEEN
Bilirubin Urine: NEGATIVE
Glucose, UA: NEGATIVE mg/dL
Hgb urine dipstick: NEGATIVE
Ketones, ur: NEGATIVE mg/dL
Leukocytes,Ua: NEGATIVE
Nitrite: POSITIVE — AB
Protein, ur: NEGATIVE mg/dL
Specific Gravity, Urine: 1.01 (ref 1.005–1.030)
pH: 6 (ref 5.0–8.0)

## 2018-09-28 LAB — COMPREHENSIVE METABOLIC PANEL
ALT: 69 U/L — ABNORMAL HIGH (ref 0–44)
AST: 38 U/L (ref 15–41)
Albumin: 4.6 g/dL (ref 3.5–5.0)
Alkaline Phosphatase: 61 U/L (ref 38–126)
Anion gap: 11 (ref 5–15)
BUN: 14 mg/dL (ref 8–23)
CO2: 23 mmol/L (ref 22–32)
Calcium: 9.6 mg/dL (ref 8.9–10.3)
Chloride: 101 mmol/L (ref 98–111)
Creatinine, Ser: 1.09 mg/dL (ref 0.61–1.24)
GFR calc Af Amer: >60 mL/min (ref >60)
GFR calc non Af Amer: >60 mL/min (ref >60)
Glucose, Bld: 110 mg/dL — ABNORMAL HIGH (ref 70–99)
Potassium: 4.4 mmol/L (ref 3.5–5.1)
Sodium: 135 mmol/L (ref 135–145)
Total Bilirubin: 0.9 mg/dL (ref 0.3–1.2)
Total Protein: 8.1 g/dL (ref 6.5–8.1)

## 2018-09-28 LAB — LIPASE, BLOOD: Lipase: 34 U/L (ref 11–51)

## 2018-09-28 MED ORDER — ACETAMINOPHEN 325 MG PO TABS: 650.0000 mg | ORAL_TABLET | ORAL | Status: DC | PRN

## 2018-09-28 MED ORDER — SODIUM CHLORIDE 0.9 % IV BOLUS (SEPSIS)
1000.0000 mL | Freq: Once | INTRAVENOUS | Status: AC
  Administered 2018-09-28: 20:00:00 1000 mL via INTRAVENOUS

## 2018-09-28 MED ORDER — ACETAMINOPHEN 160 MG/5ML PO SOLN: 650.0000 mg | ORAL | Status: DC | PRN

## 2018-09-28 MED ORDER — STROKE: EARLY STAGES OF RECOVERY BOOK
Freq: Once | Status: DC
  Filled 2018-09-28 (×3): qty 1

## 2018-09-28 MED ORDER — ASPIRIN 325 MG PO TABS
325.0000 mg | ORAL_TABLET | Freq: Every day | ORAL | Status: DC
  Administered 2018-09-29 – 2018-10-01 (×3): 325 mg via ORAL
  Filled 2018-09-28 (×3): qty 1

## 2018-09-28 MED ORDER — ACETAMINOPHEN 650 MG RE SUPP: 650.0000 mg | RECTAL | Status: DC | PRN

## 2018-09-28 MED ORDER — SODIUM CHLORIDE 0.9 % IV SOLN
1000.0000 mL | INTRAVENOUS | Status: DC
  Administered 2018-09-29 (×2): 1000 mL via INTRAVENOUS

## 2018-09-28 MED ORDER — ASPIRIN 300 MG RE SUPP: 300.0000 mg | Freq: Every day | RECTAL | Status: DC

## 2018-09-28 NOTE — ED Provider Notes (Signed)
Care handoff received from Dr. Tomi Bamberger at shift change please see his note for further details.  In short 68 year old male history of bladder cancer, hypertension presents today for right flank pain and confusion.  Work-up thus far reassuring spinal stenosis noted and may be source of patient's back pain.  Urinalysis, nitrite positive no other signs of UTI sent for culture noted case per antibiotics at this time CMP nonacute Lipase within normal limit CBC within normal limits CT Renal:  IMPRESSION:  1. No acute findings within the abdomen or pelvis. No renal or  ureteral calculi. No bowel obstruction or evidence of bowel wall  inflammation. No evidence of acute solid organ abnormality. No free  fluid or inflammatory change. Appendix is normal.  2. Fatty infiltration of the liver.  3. Degenerative spondylosis of the thoracolumbar spine, most  prominent at the L3-4 and L4-5 levels where there are moderate to  severe central canal stenoses secondary to a combination of disc  bulge and degenerative facet hypertrophy. This is a potential source  for patient's flank pain. Consider nonemergent lumbar spine MRI for  further characterization.  4. Additional chronic/incidental findings detailed above.    Aortic Atherosclerosis (ICD10-I70.0).   CXR:  IMPRESSION:  No active disease.    Plan of care discharge is to follow CT head and disposition appropriately. Physical Exam  BP 132/73   Pulse 66   Temp 98.6 F (37 C) (Oral)   Resp 15   SpO2 98%   Physical Exam Constitutional:      General: He is not in acute distress.    Appearance: Normal appearance. He is well-developed. He is not ill-appearing or diaphoretic.  HENT:     Head: Normocephalic and atraumatic.     Right Ear: External ear normal.     Left Ear: External ear normal.     Nose: Nose normal.  Eyes:     General: Vision grossly intact. Gaze aligned appropriately.     Pupils: Pupils are equal, round, and reactive to light.   Neck:     Musculoskeletal: Normal range of motion.     Trachea: Trachea and phonation normal. No tracheal deviation.  Pulmonary:     Effort: Pulmonary effort is normal. No respiratory distress.  Musculoskeletal: Normal range of motion.  Skin:    General: Skin is warm and dry.  Neurological:     Mental Status: He is alert.     GCS: GCS eye subscore is 4. GCS verbal subscore is 5. GCS motor subscore is 6.     Comments: Speech is clear and goal oriented, follows commands Major Cranial nerves without deficit, no facial droop  Psychiatric:        Behavior: Behavior normal.     ED Course/Procedures   Clinical Course as of Sep 28 2318  Tue Sep 28, 2018  2045 Urinalysis is positive for nitrites.  Few RBCs and white blood cells.   [JK]  2148 DIscussed findings with son.  He has been concerned about the confusion.  He has been having to help him to bed.  Pt was very forgetful.  Seems to be getting better though.  Requests brain imaging.  Will ct head.   [TD]  1761 Dr. Leonel Ramsay   [BM]  2320 Dr. Maudie Mercury   [BM]    Clinical Course User Index [BM] Deliah Boston, PA-C [JK] Dorie Rank, MD    .Critical Care Performed by: Deliah Boston, PA-C Authorized by: Deliah Boston, PA-C   Critical care  provider statement:    Critical care time (minutes):  45   Critical care was necessary to treat or prevent imminent or life-threatening deterioration of the following conditions:  CNS failure or compromise   Critical care was time spent personally by me on the following activities:  Discussions with consultants, evaluation of patient's response to treatment, examination of patient, ordering and performing treatments and interventions, ordering and review of laboratory studies, ordering and review of radiographic studies, pulse oximetry, re-evaluation of patient's condition, obtaining history from patient or surrogate, review of old charts and development of treatment plan with patient or  surrogate    MDM  CT head:  IMPRESSION:  1. Findings suspicious for multifocal subacute ischemia throughout  the right cerebral hemisphere. Recommend MRI for further  characterization to exclude the possibility of underlying lesions.  2. No hemorrhage. No hyperdense vessel, with special attention to  the right MCA.  - Discussed case with on-call neurology Dr. Leonel Ramsay who recommends MRI brain without contrast, transferred to [facility] for neurology evaluation and hospitalist admission. - MRI brain without contrast ordered, COVID-19 screening test ordered, consult called to hospitalist.  Discussed case with Dr. Maudie Mercury will be seeing patient for admission and transfer to [facility]. - On reassessment patient resting comfortably with son at bedside they state understanding of care plan and are agreeable to transfer at this time he has no complaints or further questions. - Patient has been admitted to hospital service for further evaluation and management.  Note: Portions of this report may have been transcribed using voice recognition software. Every effort was made to ensure accuracy; however, inadvertent computerized transcription errors may still be present.   Gari Crown 09/28/18 [place], [place], MD 10/01/18 [phone]

## 2018-09-28 NOTE — ED Notes (Signed)
Admitting physician at bedside

## 2018-09-28 NOTE — ED Triage Notes (Addendum)
Pt reports that he had right flank pains for past couple days. Reports that he has no problems with urination. Reports that since Friday had night couldn't sleep and hasn't felt right since.  Has bladder cancer, had shortage and hasnt had treatment since last December. Reports went to [facility] 2 weeks ago. Pt A&O x 4.

## 2018-09-28 NOTE — ED Notes (Signed)
Pt provided labeled urine specimen cup to collect and submit for U/A order when possible. [employer]

## 2018-09-28 NOTE — ED Provider Notes (Signed)
[facility] DEPT Provider Note   CSN: [id number] Arrival date & time: 09/28/18  1746    History   Chief Complaint Chief Complaint  Patient presents with   Flank Pain   Altered Mental Status    HPI Shaun Ball is a 68 y.o. male.     HPI Pt states he started having pain in his back.  The pain is in the right flank.  The pain does not radiate, does not feel like a kidney stone but feels like it is in his kidney.  Pt has felt very fatigued.    SOme nausea the other day.  No fever.  He does have a headache.  He has a mild cough.  Pt just does not feel well.  He feels confused. He tried to get his grill to light for example and he could not do it.  He was able to use the match but he was surprised he could not get it to light. No trouble with speech.  No numbness or weakness.   No daily alcohol use.  Past Medical History:  Diagnosis Date   Arthritis    Bladder cancer ([place])    Dysuria    History of kidney stones 2012   passed / has renal cyst to evaluated at later time   Hypertension    Lesion of bladder    Pneumonia    hx of    PONV (postoperative nausea and vomiting)    10 yrs ago  no problems with last 2 surgeries   Wears glasses     There are no active problems to display for this patient.   Past Surgical History:  Procedure Laterality Date   CYSTOSCOPY N/A 11/05/2016   Procedure: CYSTOSCOPY;  Surgeon: Ceasar Mons, MD;  Location: [facility];  Service: Urology;  Laterality: N/A;   CYSTOSCOPY WITH BIOPSY Bilateral 10/24/2016   Procedure: CYSTOSCOPY WITH BLADDER BIOPSY/ BILATERAL RETROGRADE FKCLEXNT;  Surgeon: Ceasar Mons, MD;  Location: [facility];  Service: Urology;  Laterality: Bilateral;   CYSTOSCOPY WITH BIOPSY N/A 05/12/2018   Procedure: CYSTOSCOPY WITH BLADDER BIOPSY/ BILATERAL RETROGRADE PYELOGRAM BIOPSY;  Surgeon: Ceasar Mons, MD;  Location: [facility];   Service: Urology;  Laterality: N/A;   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 Authorizing Provider  fluticasone (FLONASE) 50 MCG/ACT nasal spray Place 1 spray into both nostrils as needed for allergies or rhinitis.   Yes [provider]  lisinopril (PRINIVIL,ZESTRIL) 40 MG tablet Take 40 mg by mouth every evening.    Yes [provider]  montelukast (SINGULAIR) 10 MG tablet Take 10 mg by mouth daily as needed (allergies).  07/14/17  Yes [provider]  phenazopyridine (PYRIDIUM) 200 MG tablet Take 1 tablet (200 mg total) by mouth 3 (three) times daily as needed (for pain with urination). 05/12/18 05/12/19 Yes Ceasar Mons, MD  sulfamethoxazole-trimethoprim (BACTRIM DS,SEPTRA DS) 800-160 MG tablet Take 1 tablet by mouth 2 (two) times daily. Patient taking differently: Take 1  tablet by mouth once.  05/12/18  Yes Ceasar Mons, MD  tamsulosin (FLOMAX) 0.4 MG CAPS capsule Take 1 capsule (0.4 mg total) by mouth daily. Patient taking differently: Take 0.4 mg by mouth daily after breakfast.  10/24/16  Yes Ceasar Mons, MD  acetaminophen (TYLENOL) 500 MG tablet Take 500 mg by mouth every 6 (six) hours as needed for moderate pain.    [provider]  HYDROcodone-acetaminophen (NORCO/VICODIN) 5-325 MG tablet Take 1 tablet by mouth every 6 (six) hours as needed for moderate pain. Patient not taking: Reported on 05/07/2018 11/05/16   Ceasar Mons, MD  oxybutynin (DITROPAN) 5  MG tablet Take 1 tablet (5 mg total) by mouth every 8 (eight) hours as needed for bladder spasms. Patient not taking: Reported on 09/28/2018 05/12/18   Ceasar Mons, MD    Family History Family History  Problem Relation Age of Onset   Blindness Mother    Cataracts Mother    Diabetes Mother    Glaucoma Mother    Macular degeneration Mother    Cataracts Father    Diabetes Father    Diabetes Sister    Diabetes Brother    Blindness Maternal Uncle    Macular degeneration Maternal Uncle    Macular degeneration Paternal Aunt    Amblyopia Neg Hx    Retinal detachment Neg Hx    Strabismus Neg Hx    Retinitis pigmentosa Neg Hx     Social History Social History   Tobacco Use   Smoking status: Former Smoker    Years: 28.00    Types: Cigarettes    Quit date: 10/22/1999    Years since quitting: 18.9   Smokeless tobacco: Never Used  Substance Use Topics   Alcohol use: Yes    Alcohol/week: 7.0 standard drinks    Types: 7 Cans of beer per week    Comment: occ    Drug use: No     Allergies   Statins   Review of Systems Review of Systems  All other systems reviewed and are negative.    Physical Exam Updated Vital Signs BP 132/73    Pulse 66    Temp 98.6 F (37 C) (Oral)    Resp 15    SpO2 98%   Physical Exam Vitals signs and nursing note reviewed.  Constitutional:      General: He is not in acute distress.    Appearance: He is well-developed.  HENT:     Head: Normocephalic and atraumatic.     Right Ear: External ear normal.     Left Ear: External ear normal.  Eyes:     General: No scleral icterus.       Right eye: No discharge.        Left eye: No discharge.     Conjunctiva/sclera: Conjunctivae normal.  Neck:     Musculoskeletal: Neck supple.     Trachea: No tracheal deviation.  Cardiovascular:     Rate and Rhythm: Normal rate and regular rhythm.  Pulmonary:     Effort: Pulmonary effort is normal. No respiratory distress.      Breath sounds: Normal breath sounds. No stridor. No wheezing or rales.  Abdominal:     General: Bowel sounds are normal. There is no distension.     Palpations: Abdomen is soft.     Tenderness: There is no abdominal tenderness. There is right CVA tenderness. There is no guarding or rebound.  Musculoskeletal:  General: No tenderness.  Skin:    General: Skin is warm and dry.     Findings: No rash.  Neurological:     Mental Status: He is alert and oriented to person, place, and time.     Cranial Nerves: No cranial nerve deficit (No facial droop, extraocular movements intact, tongue midline ).     Sensory: No sensory deficit.     Motor: No abnormal muscle tone or seizure activity.     Coordination: Coordination normal.     Comments: No pronator drift bilateral upper extrem, able to hold both legs off bed for 5 seconds, sensation intact in all extremities, no visual field cuts, no left or right sided neglect, normal finger-nose exam bilaterally, no nystagmus noted       ED Treatments / Results  Labs (all labs ordered are listed, but only abnormal results are displayed) Labs Reviewed  COMPREHENSIVE METABOLIC PANEL - Abnormal; Notable for the following components:      Result Value   Glucose, Bld 110 (*)    ALT 69 (*)    All other components within normal limits  URINALYSIS, ROUTINE W REFLEX MICROSCOPIC - Abnormal; Notable for the following components:   Color, Urine AMBER (*)    Nitrite POSITIVE (*)    All other components within normal limits  URINE CULTURE  LIPASE, BLOOD  CBC    EKG None  Radiology Ct Head Wo Contrast  Result Date: 09/28/2018 CLINICAL DATA:  Altered level of consciousness. EXAM: CT HEAD WITHOUT CONTRAST TECHNIQUE: Contiguous axial images were obtained from the base of the skull through the vertex without intravenous contrast. COMPARISON:  None. FINDINGS: Brain: Moderate region of ill-defined cortical low-density in the right occipital and posterior  temporal region, nonspecific but suspicious for subacute ischemia. Small ill-defined cortical low-density in the right frontal lobe. Low-density in the right basal ganglia suspicious for chronic or subacute ischemia. No hemorrhage. No midline shift, hydrocephalus, or subdural/extra-axial collection. Cerebellum is unremarkable. Vascular: No hyperdense vessel, with special attention to the right MCA. Mild atherosclerosis of skull base vasculature. Skull: No fracture or focal lesion. Sinuses/Orbits: Paranasal sinuses and mastoid air cells are clear. The visualized orbits are unremarkable. Other: None. IMPRESSION: 1. Findings suspicious for multifocal subacute ischemia throughout the right cerebral hemisphere. Recommend MRI for further characterization to exclude the possibility of underlying lesions. 2. No hemorrhage. No hyperdense vessel, with special attention to the right MCA. Electronically Signed   By: Keith Rake M.D.   On: 09/28/2018 23:01   Dg Chest Portable 1 View  Result Date: 09/28/2018 CLINICAL DATA:  Flank pain. EXAM: PORTABLE CHEST 1 VIEW COMPARISON:  None. FINDINGS: Cardiomediastinal silhouette is normal. Mediastinal contours appear intact. There is no evidence of focal airspace consolidation, pleural effusion or pneumothorax. Osseous structures are without acute abnormality. Soft tissues are grossly normal. IMPRESSION: No active disease. Electronically Signed   By: Fidela Salisbury M.D.   On: 09/28/2018 20:15   Ct Renal Stone Study  Result Date: 09/28/2018 CLINICAL DATA:  Flank pain. EXAM: CT ABDOMEN AND PELVIS WITHOUT CONTRAST TECHNIQUE: Multidetector CT imaging of the abdomen and pelvis was performed following the standard protocol without IV contrast. COMPARISON:  CT abdomen dated 10/01/2016. FINDINGS: Lower chest: No acute abnormality. Hepatobiliary: Liver is diffusely low in density indicating fatty infiltration. Gallbladder is unremarkable, partially contracted. No bile duct  dilatation. Pancreas: Unremarkable. No pancreatic ductal dilatation or surrounding inflammatory changes. Spleen: Normal in size without focal abnormality. Adrenals/Urinary Tract: Adrenal glands appear normal. RIGHT renal  cyst. Indeterminate mass exophytic to the lower pole of the LEFT kidney is stable for 2 years suggesting benignity, considered benign per MRI report of 04/24/2017. Kidneys otherwise unremarkable without suspicious mass, stone or hydronephrosis. No ureteral or bladder calculi identified. Bladder appears normal. Stomach/Bowel: No dilated large or small bowel loops. No evidence of bowel wall inflammation. Appendix appears normal. Stomach is unremarkable, partially decompressed. Vascular/Lymphatic: Aortic atherosclerosis. No enlarged lymph nodes seen in the abdomen or pelvis. Reproductive: Prostate gland is upper normal in size. Other: No free fluid or abscess collection. No free intraperitoneal air. Musculoskeletal: No acute or suspicious osseous finding. Degenerative spondylosis of the thoracolumbar spine, mild to moderate in degree. Bilateral inguinal hernias, LEFT greater than RIGHT, containing fat only. IMPRESSION: 1. No acute findings within the abdomen or pelvis. No renal or ureteral calculi. No bowel obstruction or evidence of bowel wall inflammation. No evidence of acute solid organ abnormality. No free fluid or inflammatory change. Appendix is normal. 2. Fatty infiltration of the liver. 3. Degenerative spondylosis of the thoracolumbar spine, most prominent at the L3-4 and L4-5 levels where there are moderate to severe central canal stenoses secondary to a combination of disc bulge and degenerative facet hypertrophy. This is a potential source for patient's flank pain. Consider nonemergent lumbar spine MRI for further characterization. 4. Additional chronic/incidental findings detailed above. Aortic Atherosclerosis (ICD10-I70.0). Electronically Signed   By: Franki Cabot M.D.   On: 09/28/2018  20:25    Procedures Procedures (including critical care time)  Medications Ordered in ED Medications  sodium chloride 0.9 % bolus 1,000 mL (0 mLs Intravenous Stopped 09/28/18 2206)    Followed by  0.9 %  sodium chloride infusion (has no administration in time range)     Initial Impression / Assessment and Plan / ED Course  I have reviewed the triage vital signs and the nursing notes.  Pertinent labs & imaging results that were available during my care of the patient were reviewed by me and considered in my medical decision making (see chart for details).  Clinical Course as of Sep 27 2308  Tue Sep 28, 2018  2045 Urinalysis is positive for nitrites.  Few RBCs and white blood cells.   [JK]  2148 DIscussed findings with son.  He has been concerned about the confusion.  He has been having to help him to bed.  Pt was very forgetful.  Seems to be getting better though.  Requests brain imaging.  Will ct head.   [JK]  2214 CT head if non-acute d/c with pain meds and steroids for back pain.   [BM]    Clinical Course User Index [BM] Deliah Boston, PA-C [JK] Dorie Rank, MD     ED workup notable for spinal stenosis.  May be the source of his back pain.  UA not definitive for UTI.  Labs unremarkable.  No signs of severe, infection, sepsis, electrolyte abnormality.  Son concerned about pt's confusion.  No focal findings noted on exam, but pt does have history of malignancy.  Will CT head.  If no acute findings, will dc home, pain management,  recommend outpatient follow up.    Final Clinical Impressions(s) / ED Diagnoses   Final diagnoses:  Spinal stenosis of lumbar region without neurogenic claudication  Confusion    ED Discharge Orders    None       Dorie Rank, MD 09/28/18 2312

## 2018-09-29 ENCOUNTER — Inpatient Hospital Stay (HOSPITAL_COMMUNITY): Payer: Medicare Other

## 2018-09-29 ENCOUNTER — Inpatient Hospital Stay (HOSPITAL_COMMUNITY): Payer: No Typology Code available for payment source

## 2018-09-29 ENCOUNTER — Encounter (HOSPITAL_COMMUNITY): Payer: Self-pay | Admitting: Internal Medicine

## 2018-09-29 DIAGNOSIS — R945 Abnormal results of liver function studies: Secondary | ICD-10-CM | POA: Diagnosis present

## 2018-09-29 DIAGNOSIS — I639 Cerebral infarction, unspecified: Secondary | ICD-10-CM

## 2018-09-29 DIAGNOSIS — I10 Essential (primary) hypertension: Secondary | ICD-10-CM | POA: Diagnosis present

## 2018-09-29 DIAGNOSIS — I340 Nonrheumatic mitral (valve) insufficiency: Secondary | ICD-10-CM

## 2018-09-29 LAB — COMPREHENSIVE METABOLIC PANEL
ALT: 58 U/L — ABNORMAL HIGH (ref 0–44)
AST: 33 U/L (ref 15–41)
Albumin: 3.9 g/dL (ref 3.5–5.0)
Alkaline Phosphatase: 56 U/L (ref 38–126)
Anion gap: 4 — ABNORMAL LOW (ref 5–15)
BUN: 13 mg/dL (ref 8–23)
CO2: 26 mmol/L (ref 22–32)
Calcium: 8.8 mg/dL — ABNORMAL LOW (ref 8.9–10.3)
Chloride: 106 mmol/L (ref 98–111)
Creatinine, Ser: 1.05 mg/dL (ref 0.61–1.24)
GFR calc Af Amer: >60 mL/min (ref >60)
GFR calc non Af Amer: >60 mL/min (ref >60)
Glucose, Bld: 105 mg/dL — ABNORMAL HIGH (ref 70–99)
Potassium: 4.2 mmol/L (ref 3.5–5.1)
Sodium: 136 mmol/L (ref 135–145)
Total Bilirubin: 1 mg/dL (ref 0.3–1.2)
Total Protein: 7.2 g/dL (ref 6.5–8.1)

## 2018-09-29 LAB — CBC
HCT: 47.4 % (ref 39.0–52.0)
Hemoglobin: 15.7 g/dL (ref 13.0–17.0)
MCH: 30.4 pg (ref 26.0–34.0)
MCHC: 33.1 g/dL (ref 30.0–36.0)
MCV: 91.9 fL (ref 80.0–100.0)
Platelets: 169 10*3/uL (ref 150–400)
RBC: 5.16 MIL/uL (ref 4.22–5.81)
RDW: 12.6 % (ref 11.5–15.5)
WBC: 9.7 10*3/uL (ref 4.0–10.5)
nRBC: 0 % (ref 0.0–0.2)

## 2018-09-29 LAB — LIPID PANEL
Cholesterol: 289 mg/dL — ABNORMAL HIGH (ref 0–200)
HDL: 34 mg/dL — ABNORMAL LOW (ref >40)
LDL Cholesterol: 219 mg/dL — ABNORMAL HIGH (ref 0–99)
Total CHOL/HDL Ratio: 8.5 RATIO
Triglycerides: 181 mg/dL — ABNORMAL HIGH (ref <150)
VLDL: 36 mg/dL (ref 0–40)

## 2018-09-29 LAB — ECHOCARDIOGRAM COMPLETE

## 2018-09-29 LAB — CK TOTAL AND CKMB (NOT AT ARMC)
CK, MB: 0.9 ng/mL (ref 0.5–5.0)
Relative Index: INVALID (ref 0.0–2.5)
Total CK: 52 U/L (ref 49–397)

## 2018-09-29 LAB — SARS CORONAVIRUS 2 BY RT PCR (HOSPITAL ORDER, PERFORMED IN ~~LOC~~ HOSPITAL LAB): SARS Coronavirus 2: NEGATIVE

## 2018-09-29 LAB — HEMOGLOBIN A1C
Hgb A1c MFr Bld: 6 % — ABNORMAL HIGH (ref 4.8–5.6)
Mean Plasma Glucose: 125.5 mg/dL

## 2018-09-29 LAB — HIV ANTIBODY (ROUTINE TESTING W REFLEX): HIV Screen 4th Generation wRfx: NONREACTIVE

## 2018-09-29 LAB — TSH: TSH: 1.19 u[IU]/mL (ref 0.350–4.500)

## 2018-09-29 MED ORDER — TAMSULOSIN HCL 0.4 MG PO CAPS
0.4000 mg | ORAL_CAPSULE | Freq: Every day | ORAL | Status: DC
  Administered 2018-09-29 – 2018-10-02 (×4): 0.4 mg via ORAL
  Filled 2018-09-29 (×4): qty 1

## 2018-09-29 MED ORDER — FLUTICASONE PROPIONATE 50 MCG/ACT NA SUSP
1.0000 | NASAL | Status: DC | PRN
  Filled 2018-09-29: qty 16

## 2018-09-29 MED ORDER — HYDRALAZINE HCL 20 MG/ML IJ SOLN: 10.0000 mg | INTRAMUSCULAR | Status: DC | PRN

## 2018-09-29 MED ORDER — IOHEXOL 350 MG/ML SOLN
100.0000 mL | Freq: Once | INTRAVENOUS | Status: AC | PRN
  Administered 2018-09-29: 100 mL via INTRAVENOUS

## 2018-09-29 MED ORDER — ATORVASTATIN CALCIUM 40 MG PO TABS
40.0000 mg | ORAL_TABLET | Freq: Every day | ORAL | Status: DC
  Administered 2018-09-29 – 2018-10-01 (×3): 40 mg via ORAL
  Filled 2018-09-29 (×3): qty 1

## 2018-09-29 MED ORDER — SODIUM CHLORIDE (PF) 0.9 % IJ SOLN
INTRAMUSCULAR | Status: AC
  Filled 2018-09-29: qty 50

## 2018-09-29 MED ORDER — EZETIMIBE 10 MG PO TABS
10.0000 mg | ORAL_TABLET | Freq: Every day | ORAL | Status: DC
  Administered 2018-09-29 – 2018-10-02 (×4): 10 mg via ORAL
  Filled 2018-09-29 (×4): qty 1

## 2018-09-29 MED ORDER — LISINOPRIL 20 MG PO TABS: 40.0000 mg | ORAL_TABLET | Freq: Every evening | ORAL | Status: DC

## 2018-09-29 MED ORDER — MONTELUKAST SODIUM 10 MG PO TABS: 10.0000 mg | ORAL_TABLET | Freq: Every day | ORAL | Status: DC | PRN

## 2018-09-29 NOTE — Consult Note (Signed)
Neurology Consultation Reason for Consult: Stroke Referring Physician: Hillard Danker  CC: Confusion  History is obtained from: Patient  HPI: Shaun Ball is a 68 y.o. male with a history of hypertension who presents with confusion that started last Friday.  He actually came to the emergency department tonight for flank pain, but while here complained of confusion as well.  He has been forgetful, needing help to get to bed.  Patient reports that he has been having trouble using his cell phone.  There is also been some concern on the son's part of her vision, though the patient denies any problems.   LKW: Friday tpa given?: no, outside of window.    ROS: A 14 point ROS was performed and is negative except as noted in the HPI.   Past Medical History:  Diagnosis Date  . Arthritis   . Bladder cancer ([place])   . Dysuria   . History of kidney stones 2012   passed / has renal cyst to evaluated at later time  . Hypertension   . Lesion of bladder   . Pneumonia    hx of   . PONV (postoperative nausea and vomiting)    10 yrs ago  no problems with last 2 surgeries  . Wears glasses      Family History  Problem Relation Age of Onset  . Blindness Mother   . Cataracts Mother   . Diabetes Mother   . Glaucoma Mother   . Macular degeneration Mother   . Cataracts Father   . Diabetes Father   . Diabetes Sister   . Diabetes Brother   . Blindness Maternal Uncle   . Macular degeneration Maternal Uncle   . Macular degeneration Paternal Aunt   . Amblyopia Neg Hx   . Retinal detachment Neg Hx   . Strabismus Neg Hx   . Retinitis pigmentosa Neg Hx      Social History:  reports that he quit smoking about 18 years ago. His smoking use included cigarettes. He quit after 28.00 years of use. He has never used smokeless tobacco. He reports current alcohol use of about 7.0 standard drinks of alcohol per week. He reports that he does not use drugs.   Exam: Current vital signs: BP 116/61   Pulse 62    Temp 98.6 F (37 C) (Oral)   Resp 14   SpO2 94%  Vital signs in last 24 hours: Temp:  [98.6 F (37 C)] 98.6 F (37 C) (08/18 1754) Pulse Rate:  [62-73] 62 (08/19 0600) Resp:  [14-20] 14 (08/19 0600) BP: (116-142)/(61-82) 116/61 (08/19 0600) SpO2:  [94 %-100 %] 94 % (08/19 0600)   Physical Exam  Constitutional: Appears well-developed and well-nourished.  Psych: Affect appropriate to situation Eyes: No scleral injection HENT: No OP obstrucion Head: Normocephalic.  Cardiovascular: Normal rate and regular rhythm.  Respiratory: Effort normal, non-labored breathing GI: Soft.  No distension. There is no tenderness.  Skin: WDI  Neuro: Mental Status: Patient is awake, alert, oriented to person, place, month, year, and situation. Patient is able to give a clear and coherent history. No signs of aphasia or neglect Cranial Nerves: II: Visual Fields are full. Pupils are equal, round, and reactive to light.   III,IV, VI: EOMI without ptosis or diploplia.  V: Facial sensation is symmetric to temperature VII: Facial movement is symmetric.  VIII: hearing is intact to voice X: Uvula elevates symmetrically XI: Shoulder shrug is symmetric. XII: tongue is midline without atrophy or fasciculations.  Motor: Tone is normal. Bulk is normal. 5/5 strength was present in all four extremities.  Sensory: Sensation is symmetric to light touch and temperature in the arms and legs. Cerebellar: No clear ataxia   I have reviewed labs in epic and the results pertinent to this consultation are: cmp - unremarkable  I have reviewed the images obtained: CT head - negative.   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  I suspect that ICA is likely the source.  With onset on Friday, and stability since that time, I think further evaluation for secondary stroke prevention is appropriate.  Recommendations: - HgbA1c, fasting lipid panel - MRI of the brain without  contrast - Frequent neuro checks - Echocardiogram -CTA head and neck - Prophylactic therapy-Antiplatelet med: Aspirin - dose 325mg  PO or 300mg  PR - Risk factor modification - Telemetry monitoring - PT consult, OT consult, Speech consult - Stroke team to follow   Roland Rack, MD [facility] [phone]  If 7pm- 7am, please page neurology on call as listed in [place].

## 2018-09-29 NOTE — Evaluation (Signed)
Physical Therapy Evaluation Patient Details Name: Shaun Ball MRN: [id number] DOB: July 23, 1950 Today's Date: 09/29/2018   History of Present Illness  68 year old male history of bladder cancer, hypertension presents today for right flank, back pain and confusion. Admitted 09/29/2018 and found to have multifocal subacute ischemia throughoutthe right cerebral hemisphere   Clinical Impression  Patient evaluated by Physical Therapy with no further acute PT needs identified. Pt ambulating hallway distances independently, negotiated 10 steps with railing. Scoring 22/24 on Dynamic Gait Index, indicating he is not at risk for falls. Does endorse difficulty concentrating, may have higher level cognitive deficits, but overall appropriate throughout evaluation. Encouraged follow up outpatient speech therapy to further address cognition. Educated pt regarding BEFAST stroke symptoms. All education has been completed and the patient has no further questions. No follow-up Physical Therapy or equipment needs. PT is signing off. Thank you for this referral.     Follow Up Recommendations No PT follow up    Equipment Recommendations  None recommended by PT    Recommendations for Other Services       Precautions / Restrictions Precautions Precautions: None Restrictions Weight Bearing Restrictions: No      Mobility  Bed Mobility               General bed mobility comments: pt seated in chair upon arrival  Transfers Overall transfer level: Independent Equipment used: None                Ambulation/Gait Ambulation/Gait assistance: Independent Gait Distance (Feet): 200 Feet Assistive device: None Gait Pattern/deviations: WFL(Within Functional Limits)   Gait velocity interpretation: >2.62 ft/sec, indicative of community ambulatory General Gait Details: No gross unsteadiness noted with high level balance activities  Stairs Stairs: Yes Stairs assistance: Modified independent  (Device/Increase time) Stair Management: One rail Right Number of Stairs: 10 General stair comments: Use of right rail, no physical assist  Wheelchair Mobility    Modified Rankin (Stroke Patients Only) Modified Rankin (Stroke Patients Only) Pre-Morbid Rankin Score: No symptoms Modified Rankin: No significant disability     Balance Overall balance assessment: Independent                               Standardized Balance Assessment Standardized Balance Assessment : Dynamic Gait Index   Dynamic Gait Index Level Surface: Normal Change in Gait Speed: Mild Impairment Gait with Horizontal Head Turns: Normal Gait with Vertical Head Turns: Normal Gait and Pivot Turn: Normal Step Over Obstacle: Normal Step Around Obstacles: Normal Steps: Mild Impairment Total Score: 22       Pertinent Vitals/Pain Pain Assessment: No/denies pain    Home Living Family/patient expects to be discharged to:: Private residence Living Arrangements: Children Available Help at Discharge: Family;Available 24 hours/day Type of Home: House Home Access: Stairs to enter Entrance Stairs-Rails: Left;Right;Can reach both Entrance Stairs-Number of Steps: 3 Home Layout: Able to live on main level with bedroom/bathroom;Two level Home Equipment: Walker - 2 wheels      Prior Function Level of Independence: Independent         Comments: driving     Hand Dominance   Dominant Hand: Right    Extremity/Trunk Assessment                Communication   Communication: No difficulties  Cognition Arousal/Alertness: Awake/alert Behavior During Therapy: WFL for tasks assessed/performed Overall Cognitive Status: Impaired/Different from baseline  General Comments: pt reports difficulty with concentration including using his cell phone, reports has improved since initial admission; attempted to have pt find his son's phone number in his phone  and pt with increased difficulty doing so       General Comments      Exercises     Assessment/Plan    PT Assessment Patent does not need any further PT services  PT Problem List         PT Treatment Interventions      PT Goals (Current goals can be found in the Care Plan section)  Acute Rehab PT Goals Patient Stated Goal: return home PT Goal Formulation: All assessment and education complete, DC therapy    Frequency     Barriers to discharge        Co-evaluation               AM-PAC PT "6 Clicks" Mobility  Outcome Measure Help needed turning from your back to your side while in a flat bed without using bedrails?: None Help needed moving from lying on your back to sitting on the side of a flat bed without using bedrails?: None Help needed moving to and from a bed to a chair (including a wheelchair)?: None Help needed standing up from a chair using your arms (e.g., wheelchair or bedside chair)?: None Help needed to walk in hospital room?: None Help needed climbing 3-5 steps with a railing? : None 6 Click Score: 24    End of Session   Activity Tolerance: Patient tolerated treatment well Patient left: in chair;with call bell/phone within reach Nurse Communication: Mobility status PT Visit Diagnosis: Unsteadiness on feet (R26.81);Other symptoms and signs involving the nervous system (R29.898)    Time: 1335-1350 PT Time Calculation (min) (ACUTE ONLY): 15 min   Charges:   PT Evaluation $PT Eval Low Complexity: 1 Low          Ellamae Sia, PT, DPT Acute Rehabilitation Services Pager [phone] Office [phone]   Willy Eddy 09/29/2018, 5:07 PM

## 2018-09-29 NOTE — ED Notes (Signed)
ED TO INPATIENT HANDOFF REPORT  ED Nurse Name and Phone #: [phone]  S Name/Age/Gender Shaun Ball 68 y.o. male Room/Bed: WA20/WA20  Code Status   Code Status: Full Code  Home/SNF/Other Home Patient oriented to: self, place, time and situation Is this baseline? Yes   Triage Complete: Triage complete  Chief Complaint confusion; flank pain  Triage Note Pt reports that he had right flank pains for past couple days. Reports that he has no problems with urination. Reports that since Friday had night couldn't sleep and hasn't felt right since.  Has bladder cancer, had shortage and hasnt had treatment since last December. Reports went to [facility] 2 weeks ago. Pt A&O x 4.    Allergies Allergies  Allergen Reactions  . Statins Hives and Rash    whelps,liver enzymes elevated    Level of Care/Admitting Diagnosis ED Disposition    ED Disposition Condition [facility] Area: [place] [[id number]]  Level of Care: Telemetry Medical [104]  Covid Evaluation: Person Under Investigation (PUI)  Diagnosis: [facility] [[id number]]  Admitting Physician: Jani Gravel [[id number]]  Attending Physician: Jani Gravel [phone]  Estimated length of stay: past midnight tomorrow  Certification:: I certify this patient will need inpatient services for at least 2 midnights  PT Class (Do Not Modify): Inpatient [101]  PT Acc Code (Do Not Modify): Private [1]       B Medical/Surgery History Past Medical History:  Diagnosis Date  . Arthritis   . Bladder cancer ([place])   . Dysuria   . History of kidney stones 2012   passed / has renal cyst to evaluated at later time  . Hypertension   . Lesion of bladder   . Pneumonia    hx of   . PONV (postoperative nausea and vomiting)    10 yrs ago  no problems with last 2 surgeries  . Wears glasses    Past Surgical History:  Procedure Laterality Date  . CYSTOSCOPY N/A 11/05/2016   Procedure: CYSTOSCOPY;  Surgeon: Ceasar Mons, MD;  Location: [facility];  Service: Urology;  Laterality: N/A;  . CYSTOSCOPY WITH BIOPSY Bilateral 10/24/2016   Procedure: CYSTOSCOPY WITH BLADDER BIOPSY/ BILATERAL RETROGRADE [id number];  Surgeon: Ceasar Mons, MD;  Location: [facility];  Service: Urology;  Laterality: Bilateral;  . CYSTOSCOPY WITH BIOPSY N/A 05/12/2018   Procedure: CYSTOSCOPY WITH BLADDER BIOPSY/ BILATERAL RETROGRADE PYELOGRAM BIOPSY;  Surgeon: Ceasar Mons, MD;  Location: [facility];  Service: Urology;  Laterality: N/A;  . 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 fattytumorremoved from back     . TRANSURETHRAL RESECTION OF BLADDER TUMOR N/A 11/05/2016   Procedure: TRANSURETHRAL RESECTION OF BLADDER TUMOR (TURBT);  Surgeon: Ceasar Mons, MD;  Location: [facility];  Service: Urology;  Laterality: N/A;     A IV Location/Drains/Wounds Patient Lines/Drains/Airways Status   Active Line/Drains/Airways    Name:   Placement date:   Placement time:   Site:   Days:   Peripheral IV 09/28/18 Right Antecubital   09/28/18    2052    Antecubital   1   Urethral Catheter Winter Latex;Straight-tip 18 Fr.   11/05/16    0901    Latex;Straight-tip   693   Incision (Closed) 10/24/16 Penis Other (Comment)   10/24/16    0948     705   Incision (Closed)  11/05/16 Penis Other (Comment)   11/05/16    0805     693          Intake/Output Last 24 hours  Intake/Output Summary (Last 24 hours) at 09/29/2018 8676 Last data filed at 09/28/2018 2206 Gross per 24 hour  Intake 1000 ml  Output --  Net 1000 ml    Labs/Imaging Results for orders placed or performed during the hospital encounter of 09/28/18 (from the past 48 hour(s))  Lipase, blood     Status: None   Collection Time: 09/28/18  6:18 PM  Result Value Ref Range   Lipase 34 11 - 51 U/L    Comment: Performed at  [facility], [employer] [address] [place], [place] [address]  Comprehensive metabolic panel     Status: Abnormal   Collection Time: 09/28/18  6:18 PM  Result Value Ref Range   Sodium 135 135 - 145 mmol/L   Potassium 4.4 3.5 - 5.1 mmol/L   Chloride 101 98 - 111 mmol/L   CO2 23 22 - 32 mmol/L   Glucose, Bld 110 (H) 70 - 99 mg/dL   BUN 14 8 - 23 mg/dL   Creatinine, Ser 1.09 0.61 - 1.24 mg/dL   Calcium 9.6 8.9 - 10.3 mg/dL   Total Protein 8.1 6.5 - 8.1 g/dL   Albumin 4.6 3.5 - 5.0 g/dL   AST 38 15 - 41 U/L   ALT 69 (H) 0 - 44 U/L   Alkaline Phosphatase 61 38 - 126 U/L   Total Bilirubin 0.9 0.3 - 1.2 mg/dL   GFR calc non Af Amer >60 >60 mL/min   GFR calc Af Amer >60 >60 mL/min   Anion gap 11 5 - 15    Comment: Performed at [facility], [place] [address], [place], [place] [address]  CBC     Status: None   Collection Time: 09/28/18  6:18 PM  Result Value Ref Range   WBC 10.1 4.0 - 10.5 K/uL   RBC 5.53 4.22 - 5.81 MIL/uL   Hemoglobin 16.8 13.0 - 17.0 g/dL   HCT 50.8 39.0 - 52.0 %   MCV 91.9 80.0 - 100.0 fL   MCH 30.4 26.0 - 34.0 pg   MCHC 33.1 30.0 - 36.0 g/dL   RDW 12.6 11.5 - 15.5 %   Platelets 192 150 - 400 K/uL   nRBC 0.0 0.0 - 0.2 %    Comment: Performed at [facility], [place] [address] [place], [place] [address]  Urinalysis, Routine w reflex microscopic     Status: Abnormal   Collection Time: 09/28/18  7:45 PM  Result Value Ref Range   Color, Urine AMBER (A) YELLOW    Comment: BIOCHEMICALS MAY BE AFFECTED BY COLOR   APPearance CLEAR CLEAR   Specific Gravity, Urine 1.010 1.005 - 1.030   pH 6.0 5.0 - 8.0   Glucose, UA NEGATIVE NEGATIVE mg/dL   Hgb urine dipstick NEGATIVE NEGATIVE   Bilirubin Urine NEGATIVE NEGATIVE   Ketones, ur NEGATIVE NEGATIVE mg/dL   Protein, ur NEGATIVE NEGATIVE mg/dL   Nitrite POSITIVE (A) NEGATIVE   Leukocytes,Ua NEGATIVE NEGATIVE   RBC / HPF 0-5 0 - 5 RBC/hpf   WBC, UA 0-5 0 - 5 WBC/hpf    Bacteria, UA NONE SEEN NONE SEEN    Comment: Performed at [facility], [place] [address], [place], [place] [address]  SARS Coronavirus 2 [facility] order, Performed in [facility] hospital lab) Nasopharyngeal Nasopharyngeal Swab  Status: None   Collection Time: 09/28/18 11:16 PM   Specimen: Nasopharyngeal Swab  Result Value Ref Range   SARS Coronavirus 2 NEGATIVE NEGATIVE    Comment: (NOTE) If result is NEGATIVE SARS-CoV-2 target nucleic acids are NOT DETECTED. The SARS-CoV-2 RNA is generally detectable in upper and lower  respiratory specimens during the acute phase of infection. The lowest  concentration of SARS-CoV-2 viral copies this assay can detect is 250  copies / mL. A negative result does not preclude SARS-CoV-2 infection  and should not be used as the sole basis for treatment or other  patient management decisions.  A negative result may occur with  improper specimen collection / handling, submission of specimen other  than nasopharyngeal swab, presence of viral mutation(s) within the  areas targeted by this assay, and inadequate number of viral copies  (<250 copies / mL). A negative result must be combined with clinical  observations, patient history, and epidemiological information. If result is POSITIVE SARS-CoV-2 target nucleic acids are DETECTED. The SARS-CoV-2 RNA is generally detectable in upper and lower  respiratory specimens dur ing the acute phase of infection.  Positive  results are indicative of active infection with SARS-CoV-2.  Clinical  correlation with patient history and other diagnostic information is  necessary to determine patient infection status.  Positive results do  not rule out bacterial infection or co-infection with other viruses. If result is PRESUMPTIVE POSTIVE SARS-CoV-2 nucleic acids MAY BE PRESENT.   A presumptive positive result was obtained on the submitted specimen  and confirmed on repeat testing.  While 2019 novel  coronavirus  (SARS-CoV-2) nucleic acids may be present in the submitted sample  additional confirmatory testing may be necessary for epidemiological  and / or clinical management purposes  to differentiate between  SARS-CoV-2 and other Sarbecovirus currently known to infect humans.  If clinically indicated additional testing with an alternate test  methodology [phone]) is advised. The SARS-CoV-2 RNA is generally  detectable in upper and lower respiratory sp ecimens during the acute  phase of infection. The expected result is Negative. Fact Sheet for Patients:  StrictlyIdeas.no Fact Sheet for Healthcare Providers: BankingDealers.co.za This test is not yet approved or cleared by the [place] FDA and has been authorized for detection and/or diagnosis of SARS-CoV-2 by FDA under an Emergency Use Authorization (EUA).  This EUA will remain in effect (meaning this test can be used) for the duration of the COVID-19 declaration under Section 564(b)(1) of the Act, 21 U.S.C. section 360bbb-3(b)(1), unless the authorization is terminated or revoked sooner. Performed at [facility], [place] [address], [place], [place] [address]   Lipid panel     Status: Abnormal   Collection Time: 09/29/18  5:33 AM  Result Value Ref Range   Cholesterol 289 (H) 0 - 200 mg/dL   Triglycerides 181 (H) <150 mg/dL   HDL 34 (L) >40 mg/dL   Total CHOL/HDL Ratio 8.5 RATIO   VLDL 36 0 - 40 mg/dL   LDL Cholesterol 219 (H) 0 - 99 mg/dL    Comment:        Total Cholesterol/HDL:CHD Risk Coronary Heart Disease Risk Table                     Men   Women  1/2 Average Risk   3.4   3.3  Average Risk       5.0   4.4  2 X Average Risk   9.6   7.1  3  X Average Risk  23.4   11.0        Use the calculated Patient Ratio above and the CHD Risk Table to determine the patient's CHD Risk.        ATP III CLASSIFICATION (LDL):  <100     mg/dL   Optimal  100-129   mg/dL   Near or Above                    Optimal  130-159  mg/dL   Borderline  160-189  mg/dL   High  >190     mg/dL   Very High Performed at [place] [address] [place], [place] [address]   CBC     Status: None   Collection Time: 09/29/18  5:33 AM  Result Value Ref Range   WBC 9.7 4.0 - 10.5 K/uL   RBC 5.16 4.22 - 5.81 MIL/uL   Hemoglobin 15.7 13.0 - 17.0 g/dL   HCT 47.4 39.0 - 52.0 %   MCV 91.9 80.0 - 100.0 fL   MCH 30.4 26.0 - 34.0 pg   MCHC 33.1 30.0 - 36.0 g/dL   RDW 12.6 11.5 - 15.5 %   Platelets 169 150 - 400 K/uL   nRBC 0.0 0.0 - 0.2 %    Comment: Performed at [facility], [place] [address], [place], [place] [address]  Comprehensive metabolic panel     Status: Abnormal   Collection Time: 09/29/18  5:33 AM  Result Value Ref Range   Sodium 136 135 - 145 mmol/L   Potassium 4.2 3.5 - 5.1 mmol/L   Chloride 106 98 - 111 mmol/L   CO2 26 22 - 32 mmol/L   Glucose, Bld 105 (H) 70 - 99 mg/dL   BUN 13 8 - 23 mg/dL   Creatinine, Ser 1.05 0.61 - 1.24 mg/dL   Calcium 8.8 (L) 8.9 - 10.3 mg/dL   Total Protein 7.2 6.5 - 8.1 g/dL   Albumin 3.9 3.5 - 5.0 g/dL   AST 33 15 - 41 U/L   ALT 58 (H) 0 - 44 U/L   Alkaline Phosphatase 56 38 - 126 U/L   Total Bilirubin 1.0 0.3 - 1.2 mg/dL   GFR calc non Af Amer >60 >60 mL/min   GFR calc Af Amer >60 >60 mL/min   Anion gap 4 (L) 5 - 15    Comment: Performed at [facility], [place] [address], [place], [place] [address]  TSH     Status: None   Collection Time: 09/29/18  5:33 AM  Result Value Ref Range   TSH 1.190 0.350 - 4.500 uIU/mL    Comment: Performed by a 3rd Generation assay with a functional sensitivity of <=0.01 uIU/mL. Performed at [facility], [place] [address] [place], [place] [address]    Ct Head Wo Contrast  Result Date: 09/28/2018 CLINICAL DATA:  Altered level of consciousness. EXAM: CT HEAD WITHOUT CONTRAST TECHNIQUE: Contiguous axial images were  obtained from the base of the skull through the vertex without intravenous contrast. COMPARISON:  None. FINDINGS: Brain: Moderate region of ill-defined cortical low-density in the right occipital and posterior temporal region, nonspecific but suspicious for subacute ischemia. Small ill-defined cortical low-density in the right frontal lobe. Low-density in the right basal ganglia suspicious for chronic or subacute ischemia. No hemorrhage. No midline shift, hydrocephalus, or subdural/extra-axial collection. Cerebellum is unremarkable. Vascular: No hyperdense vessel, with special attention to the right MCA. Mild atherosclerosis  of skull base vasculature. Skull: No fracture or focal lesion. Sinuses/Orbits: Paranasal sinuses and mastoid air cells are clear. The visualized orbits are unremarkable. Other: None. IMPRESSION: 1. Findings suspicious for multifocal subacute ischemia throughout the right cerebral hemisphere. Recommend MRI for further characterization to exclude the possibility of underlying lesions. 2. No hemorrhage. No hyperdense vessel, with special attention to the right MCA. Electronically Signed   By: Keith Rake M.D.   On: 09/28/2018 23:01   Dg Chest Portable 1 View  Result Date: 09/28/2018 CLINICAL DATA:  Flank pain. EXAM: PORTABLE CHEST 1 VIEW COMPARISON:  None. FINDINGS: Cardiomediastinal silhouette is normal. Mediastinal contours appear intact. There is no evidence of focal airspace consolidation, pleural effusion or pneumothorax. Osseous structures are without acute abnormality. Soft tissues are grossly normal. IMPRESSION: No active disease. Electronically Signed   By: Fidela Salisbury M.D.   On: 09/28/2018 20:15   Ct Renal Stone Study  Result Date: 09/28/2018 CLINICAL DATA:  Flank pain. EXAM: CT ABDOMEN AND PELVIS WITHOUT CONTRAST TECHNIQUE: Multidetector CT imaging of the abdomen and pelvis was performed following the standard protocol without IV contrast. COMPARISON:  CT abdomen  dated 10/01/2016. FINDINGS: Lower chest: No acute abnormality. Hepatobiliary: Liver is diffusely low in density indicating fatty infiltration. Gallbladder is unremarkable, partially contracted. No bile duct dilatation. Pancreas: Unremarkable. No pancreatic ductal dilatation or surrounding inflammatory changes. Spleen: Normal in size without focal abnormality. Adrenals/Urinary Tract: Adrenal glands appear normal. RIGHT renal cyst. Indeterminate mass exophytic to the lower pole of the LEFT kidney is stable for 2 years suggesting benignity, considered benign per MRI report of 04/24/2017. Kidneys otherwise unremarkable without suspicious mass, stone or hydronephrosis. No ureteral or bladder calculi identified. Bladder appears normal. Stomach/Bowel: No dilated large or small bowel loops. No evidence of bowel wall inflammation. Appendix appears normal. Stomach is unremarkable, partially decompressed. Vascular/Lymphatic: Aortic atherosclerosis. No enlarged lymph nodes seen in the abdomen or pelvis. Reproductive: Prostate gland is upper normal in size. Other: No free fluid or abscess collection. No free intraperitoneal air. Musculoskeletal: No acute or suspicious osseous finding. Degenerative spondylosis of the thoracolumbar spine, mild to moderate in degree. Bilateral inguinal hernias, LEFT greater than RIGHT, containing fat only. IMPRESSION: 1. No acute findings within the abdomen or pelvis. No renal or ureteral calculi. No bowel obstruction or evidence of bowel wall inflammation. No evidence of acute solid organ abnormality. No free fluid or inflammatory change. Appendix is normal. 2. Fatty infiltration of the liver. 3. Degenerative spondylosis of the thoracolumbar spine, most prominent at the L3-4 and L4-5 levels where there are moderate to severe central canal stenoses secondary to a combination of disc bulge and degenerative facet hypertrophy. This is a potential source for patient's flank pain. Consider nonemergent  lumbar spine MRI for further characterization. 4. Additional chronic/incidental findings detailed above. Aortic Atherosclerosis (ICD10-I70.0). Electronically Signed   By: Franki Cabot M.D.   On: 09/28/2018 20:25   US Abdomen Limited Ruq  Result Date: 09/29/2018 CLINICAL DATA:  Abnormal LFTs. EXAM: ULTRASOUND ABDOMEN LIMITED RIGHT UPPER QUADRANT COMPARISON:  Noncontrast CT yesterday. FINDINGS: Gallbladder: Physiologically distended. No gallstones or wall thickening visualized. No sonographic Murphy sign noted by sonographer. Common bile duct: Diameter: 4 mm, normal. Liver: No focal lesion identified. Diffusely increased and heterogeneous in parenchymal echogenicity. Portal vein is patent on color Doppler imaging with normal direction of blood flow towards the liver. Other: None. IMPRESSION: 1. Hepatic steatosis.  No evidence of focal lesion. 2. Normal sonographic appearance of the gallbladder and biliary tree. Electronically  Signed   By: Keith Rake M.D.   On: 09/29/2018 02:16    Pending Labs Unresulted Labs (From admission, onward)    Start     Ordered   09/29/18 0500  Hemoglobin A1c  Tomorrow morning,   R     09/28/18 2332   09/29/18 0500  Hepatitis panel, acute  Tomorrow morning,   R     09/29/18 0037   09/29/18 0037  CK total and CKMB (cardiac)not at [facility]  Add-on,   AD     09/29/18 0036   09/28/18 2325  HIV antibody (Routine Testing)  Once,   STAT     09/28/18 2332   09/28/18 2046  Urine Culture  Once,   STAT     09/28/18 2045          Vitals/Pain Today's Vitals   09/29/18 0530 09/29/18 0600 09/29/18 0630 09/29/18 0700  BP: 130/66 116/61 132/78 (!) 123/96  Pulse: 65 62 66 63  Resp: 14 14 12 20   Temp:      TempSrc:      SpO2: 95% 94% 97% 95%  PainSc:        Isolation Precautions No active isolations  Medications Medications  sodium chloride 0.9 % bolus 1,000 mL (0 mLs Intravenous Stopped 09/28/18 2206)    Followed by  0.9 %  sodium chloride infusion (1,000 mLs  Intravenous New Bag/Given 09/29/18 0238)   stroke: mapping our early stages of recovery book (has no administration in time range)  acetaminophen (TYLENOL) tablet 650 mg (has no administration in time range)    Or  acetaminophen (TYLENOL) solution 650 mg (has no administration in time range)    Or  acetaminophen (TYLENOL) suppository 650 mg (has no administration in time range)  aspirin suppository 300 mg (has no administration in time range)    Or  aspirin tablet 325 mg (has no administration in time range)  fluticasone (FLONASE) 50 MCG/ACT nasal spray 1 spray (has no administration in time range)  lisinopril (ZESTRIL) tablet 40 mg (has no administration in time range)  montelukast (SINGULAIR) tablet 10 mg (has no administration in time range)  tamsulosin (FLOMAX) capsule 0.4 mg (has no administration in time range)  ezetimibe (ZETIA) tablet 10 mg (has no administration in time range)  sodium chloride (PF) 0.9 % injection (has no administration in time range)  iohexol (OMNIPAQUE) 350 MG/ML injection 100 mL (100 mLs Intravenous Contrast Given 09/29/18 0806)    Mobility walks with person assist Low fall risk   Focused Assessments Neuro Assessment Handoff:  Swallow screen pass? Yes    NIH Stroke Scale ( + Modified Stroke Scale Criteria)  Interval: Initial Level of Consciousness (1a.)   : Alert, keenly responsive LOC Questions (1b. )   +: Answers both questions correctly LOC Commands (1c. )   + : Performs both tasks correctly Best Gaze (2. )  +: Normal Visual (3. )  +: No visual loss Facial Palsy (4. )    : Normal symmetrical movements Motor Arm, Left (5a. )   +: No drift Motor Arm, Right (5b. )   +: No drift Motor Leg, Left (6a. )   +: No drift Motor Leg, Right (6b. )   +: No drift Limb Ataxia (7. ): Absent Sensory (8. )   +: Normal, no sensory loss Best Language (9. )   +: No aphasia Dysarthria (10. ): Normal Extinction/Inattention (11.)   +: No Abnormality Modified SS Total   +: 0 Complete NIHSS  TOTAL: 0     Neuro Assessment:   Neuro Checks:   Initial (09/28/18 2345)  Last Documented NIHSS Modified Score: 0 (09/28/18 2345) Has TPA been given? No If patient is a Neuro Trauma and patient is going to OR before floor call report to [place] nurse: [phone] or [phone]     R Recommendations: See Admitting Provider Note  Report given to:   Additional Notes: N/a

## 2018-09-29 NOTE — Progress Notes (Signed)
  Speech Language Pathology Treatment: Cognitive-Linquistic  Patient Details Name: Shaun Ball MRN: [id number] DOB: 03/13/1950 Today's Date: 09/29/2018 Time: 4196-2229 SLP Time Calculation (min) (ACUTE ONLY): 24 min  Assessment / Plan / Recommendation Clinical Impression  Pt was seen for cognitive-linguistic treatment with his son present. Pt was cooperative throughout the session and verbalized understanding regarding the purpose of various activities as well as education regarding various aspects of cognition. Pt demonstrated 80% accuracy with a medication management (prescription) task increasing to 100% with min cues. He achieved 100% accuracy with immediate 4-item recall but consistently required cues for recall of 5 items. With problem solving related to safety he achieved 80% accuracy increasing to 100% with min cues. He completed a 3-task sequencing mental manipulation activity with 50% accuracy increasing to 100% with min-mod cues. He demonstrated 60% accuracy with time management problems increasing to 80% with mod cues. SLP will continue to follow pt.     HPI HPI: Pt is a 68 y.o. male, with PMH significant for hypertension, hyperlipidemia,, abnormal liver function, bladder cancer, who presented with confusion since 09/24/18 and possible change in vision. MRI of the brain showed moderate-sized acute/subacute infarct within the right temporal occipital lobe with associated mild petechial hemorrhage. Numerous additional small right MCA vascular territory acute/subacute infarcts involving the right frontal, parietal and occipital lobes. Multiple acute/subacute infarcts also present within the right basal ganglia.      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 Hardin Negus, [place], [place] Office number [phone] Pager [phone]                Horton Marshall 09/29/2018, 1:25 PM

## 2018-09-29 NOTE — Progress Notes (Signed)
Patient seen and evaluated, chart reviewed, please see EMR for updated orders. Please see full H&P dictated by admitting physician Dr. Maudie Mercury for same date of service.    Brief Summary:- 68 year old male history of bladder cancer, hypertension presents today for right flank and back pain and confusion.  Admitted 09/29/2018 and found to have  multifocal subacute ischemia throughout the right cerebral hemisphere    A/p 1)Subacute Right Cerebral Stroke-- confusion started 09/26/18, CT head reveals multifocal subacute ischemia throughout the right cerebral hemisphere -LDL-- 219,  HDL- 34, T . Chole 289, Trig--181 TSH 1.19 Left message Son --Jaziah Goeller I called and Discussed with S/o Sindy [place] -  -CTA head and Neck pending at [facility] MRI brain to be done at [facility] follow-up at [facility] --Prior intolerance to statins, patient willing to try Lipitor 40 mg along with Zetia, -Aspirin as ordered  2)Back pain--CT renal stone protocol showed spinal stenosis noted and may be source of patient's back pain  3) history of bladder cancer--has not had any treatment since December  4)HTN--- PTA was on lisinopril 40 mg daily, avoid over aggressive BP control at this time.  Hold lisinopril given contrast exposures and stroke,   may use IV Hydralazine 10 mg  Every 4 hours Prn for systolic blood pressure over 185 mmhg   -Patient seen and evaluated, chart reviewed, please see EMR for updated orders. Please see full H&P dictated by admitting physician Dr. Maudie Mercury for same date of service.

## 2018-09-29 NOTE — Progress Notes (Signed)
  Echocardiogram 2D Echocardiogram has been performed.  Shaun Ball 09/29/2018, 3:20 PM

## 2018-09-29 NOTE — Evaluation (Signed)
Speech Language Pathology Evaluation Patient Details Name: Shaun Ball MRN: [id number] DOB: Dec 04, 1950 Today's Date: 09/29/2018 Time: 0962-8366 SLP Time Calculation (min) (ACUTE ONLY): 19 min  Problem List:  Patient Active Problem List   Diagnosis Date Noted  . Essential hypertension 09/29/2018  . Abnormal liver function 09/29/2018  . 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 Arthritis   . Bladder cancer ([place])   . Dysuria   . History of kidney stones 2012   passed / has renal cyst to evaluated at later time  . Hypertension   . Lesion of bladder   . Pneumonia    hx of   . PONV (postoperative nausea and vomiting)    10 yrs ago  no problems with last 2 surgeries  . Wears glasses    Past Surgical History:  Past Surgical History:  Procedure Laterality Date  . CYSTOSCOPY N/A 11/05/2016   Procedure: CYSTOSCOPY;  Surgeon: Ceasar Mons, MD;  Location: [facility];  Service: Urology;  Laterality: N/A;  . CYSTOSCOPY WITH BIOPSY Bilateral 10/24/2016   Procedure: CYSTOSCOPY WITH BLADDER BIOPSY/ BILATERAL RETROGRADE [id number];  Surgeon: Ceasar Mons, MD;  Location: [facility];  Service: Urology;  Laterality: Bilateral;  . CYSTOSCOPY WITH BIOPSY N/A 05/12/2018   Procedure: CYSTOSCOPY WITH BLADDER BIOPSY/ BILATERAL RETROGRADE PYELOGRAM BIOPSY;  Surgeon: Ceasar Mons, MD;  Location: [facility];  Service: Urology;  Laterality: N/A;  . 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 fattytumorremoved from back     . TRANSURETHRAL RESECTION OF BLADDER TUMOR N/A 11/05/2016   Procedure: TRANSURETHRAL RESECTION OF BLADDER TUMOR (TURBT);  Surgeon: Ceasar Mons, MD;  Location: [facility];  Service: Urology;  Laterality:  N/A;   HPI:  Pt is a 68 y.o. male, with PMH significant for hypertension, hyperlipidemia,, abnormal liver function, bladder cancer, who presented with confusion since 09/24/18 and possible change in vision. MRI of the brain showed moderate-sized acute/subacute infarct within the right temporal occipital lobe with associated mild petechial hemorrhage. Numerous additional small right MCA vascular territory acute/subacute infarcts involving the right frontal, parietal and occipital lobes. Multiple acute/subacute infarcts also present within the right basal ganglia.   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 He denied any baseline deficits in speech, language or cognition but stated that he has been having some difficulty with processing and being sequence steps to be able to use his phone since admission. Pt's son arrived towards the end of the evaluation and he stated that the pt's processing speed is now slower compared to baseline.   The [facility] Cognitive Assessment 8.1 was completed to evaluate the pt's cognitive-linguistic skills. He achieved a score of 17/30 which is below the normal limits of 26 or more out of 30 and is suggestive of a moderate impairment. He demonstrated deficits in the areas of executive function, awareness, attention, mental manipulation, divergent naming, abstract reasoning, and delayed recall. Pt benefited from additional processing time and repetition. His speech and language skills were within normal limits but skilled SLP services are clinically indicated at this time to improve his cognitive-linguistic skills. Pt, his  son, and nursing were educated regarding results and recommendations; all parties verbalized understanding as well as agreement with plan of care.    SLP Assessment  SLP  Recommendation/Assessment: Patient needs continued Speech Lanaguage Pathology Services SLP Visit Diagnosis: Cognitive communication deficit (R41.841)    Follow Up Recommendations  (Continued SLP services at level of care recommended by PT/OT)    Frequency and Duration min 2x/week  2 weeks      SLP Evaluation Cognition  Overall Cognitive Status: Impaired/Different from baseline Arousal/Alertness: Awake/alert Orientation Level: Oriented to place;Oriented to person;Oriented to situation;Disoriented to time(Oriented to month, year, day, not date) Attention: Sustained;Focused Focused Attention: Impaired Focused Attention Impairment: Verbal complex(Vigilance impaired: 0/1) Sustained Attention: Impaired Sustained Attention Impairment: Verbal complex(Serial 7s: 0/3) Memory: Impaired Memory Impairment: Storage deficit;Retrieval deficit;Decreased recall of new information(Immediate: 4/5; delayed: 3/5; with cues: 2/2) Awareness: Impaired Awareness Impairment: Emergent impairment Problem Solving: Impaired Problem Solving Impairment: Verbal complex Executive Function: Sequencing;Reasoning;Organizing Reasoning: Impaired Reasoning Impairment: Verbal complex(Abstraction: 1/2) Sequencing: Impaired Sequencing Impairment: Verbal complex(Clock drawing: 2/3) Organizing: Appears intact(Backward digit span: 1/1)       Comprehension  Auditory Comprehension Overall Auditory Comprehension: Appears within functional limits for tasks assessed Yes/No Questions: Within Functional Limits Commands: Impaired Complex Commands: (Trail completion: 0/1) Conversation: Complex Visual Recognition/Discrimination Discrimination: Within Function Limits Reading Comprehension Reading Status: Within funtional limits    Expression Expression Primary Mode of Expression: Verbal Verbal Expression Overall Verbal Expression: Appears within functional limits for tasks assessed Initiation: No impairment Level of  Generative/Spontaneous Verbalization: Conversation Repetition: Impaired Level of Impairment: Sentence level(1/2) Confrontation: (3/3) Divergent: (0/1 (provided three words)) Pragmatics: No impairment Interfering Components: Attention Written Expression Dominant Hand: Right Written Expression: (Difficulty copying cube: 0/1)   Oral / Motor  Oral Motor/Sensory Function Overall Oral Motor/Sensory Function: Within functional limits Motor Speech Overall Motor Speech: Appears within functional limits for tasks assessed Respiration: Within functional limits Phonation: Normal Resonance: Within functional limits Articulation: Within functional limitis Intelligibility: Intelligible Motor Planning: Witnin functional limits Motor Speech Errors: Not applicable   Jonathyn Carothers I. Hardin Negus, [place], [place] Office number [phone] Pager [place] 09/29/2018, 1:15 PM

## 2018-09-29 NOTE — Evaluation (Signed)
Occupational Therapy Evaluation Patient Details Name: Shaun Ball MRN: [id number] DOB: 11-14-1950 Today's Date: 09/29/2018    History of Present Illness 68 year old male history of bladder cancer, hypertension presents today for right flank, back pain and confusion. Admitted 09/29/2018 and found to have multifocal subacute ischemia throughoutthe right cerebral hemisphere    Clinical Impression   This 68 y/o male presents with the above. PTA pt reports he is independent with ADL and functional mobility, was driving. Pt performing room level mobility without AD and standing grooming ADL at supervision level today. He currently requires minguard-light minA for LB ADL. Pt reports majority of symptoms have subsided since initial admit, but he does endorse continued difficulty concentrating and noted difficulties with following instruction and sequencing through using his phone this session, ?question higher level cognitive deficits. He will benefit from continued acute OT services to further assess higher level cognition and to maximize his overall safety and independence with ADL and mobility. Do not anticipate pt will require follow up therapy services after discharge.     Follow Up Recommendations  No OT follow up;Supervision - Intermittent    Equipment Recommendations  None recommended by OT           Precautions / Restrictions Precautions Precautions: Fall Restrictions Weight Bearing Restrictions: No      Mobility Bed Mobility               General bed mobility comments: pt seated EOB upon arrival  Transfers Overall transfer level: Modified independent Equipment used: None             General transfer comment: light supervision for safety but no assist required    Balance Overall balance assessment: Needs assistance   Sitting balance-Leahy Scale: Normal     Standing balance support: No upper extremity supported;During functional activity Standing  balance-Leahy Scale: Good                             ADL either performed or assessed with clinical judgement   ADL Overall ADL's : Needs assistance/impaired Eating/Feeding: Independent;Sitting   Grooming: Supervision/safety;Standing;Oral care Grooming Details (indicate cue type and reason): supervision for standing balance Upper Body Bathing: Supervision/ safety;Sitting   Lower Body Bathing: Min guard;Sit to/from stand   Upper Body Dressing : Set up;Sitting Upper Body Dressing Details (indicate cue type and reason): donning new gown Lower Body Dressing: Minimal assistance;Sit to/from stand Lower Body Dressing Details (indicate cue type and reason): requires assist to tie pants, supervision-minguard standing balance Toilet Transfer: Supervision/safety;Ambulation Toilet Transfer Details (indicate cue type and reason): simulated via transfer to recliner, room level mobility Toileting- Clothing Manipulation and Hygiene: Supervision/safety;Sit to/from stand       Functional mobility during ADLs: Supervision/safety       Vision Baseline Vision/History: Wears glasses Wears Glasses: At all times Patient Visual Report: Blurring of vision(reports improvements since initial admit) Vision Assessment?: Yes Eye Alignment: Within Functional Limits Ocular Range of Motion: Within Functional Limits Alignment/Gaze Preference: Within Defined Limits Tracking/Visual Pursuits: Able to track stimulus in all quads without difficulty Visual Fields: No apparent deficits     Perception     Praxis      Pertinent Vitals/Pain Pain Assessment: 0-10 Faces Pain Scale: Hurts worst Pain Location: head Pain Descriptors / Indicators: Aching Pain Intervention(s): (medication given prior to SLP serccion)     Hand Dominance Right   Extremity/Trunk Assessment Upper Extremity Assessment Upper Extremity Assessment: (mild weakness but  symmetrical )   Lower Extremity Assessment Lower  Extremity Assessment: Defer to PT evaluation       Communication Communication Communication: No difficulties   Cognition Arousal/Alertness: Awake/alert Behavior During Therapy: WFL for tasks assessed/performed Overall Cognitive Status: Impaired/Different from baseline                                 General Comments: pt reports difficulty with concentration including using his cell phone, reports has improved since initial admission; attempted to have pt find his son's phone number in his phone and pt with increased difficulty doing so    General Comments  reviewed signs/symptoms of stroke including BEFAST acronym    Exercises     Shoulder Instructions      Home Living Family/patient expects to be discharged to:: Private residence Living Arrangements: Children(son, daughter-in-law, 1/yo granddaughter) Available Help at Discharge: Family;Available 24 hours/day Type of Home: House Home Access: Stairs to enter [employer] of Steps: 3 Entrance Stairs-Rails: Left;Right;Can reach both Home Layout: Able to live on main level with bedroom/bathroom;Two level(bedroom on main level)     Bathroom Shower/Tub: Teacher, early years/pre: Standard     Home Equipment: Environmental consultant - 2 wheels      Lives With: Family    Prior Functioning/Environment Level of Independence: Independent        Comments: driving        OT Problem List: Decreased strength;Decreased activity tolerance;Decreased cognition      OT Treatment/Interventions: Self-care/ADL training;Cognitive remediation/compensation;Therapeutic activities;Patient/family education;Balance training;Therapeutic exercise;DME and/or AE instruction    OT Goals(Current goals can be found in the care plan section) Acute Rehab OT Goals Patient Stated Goal: return home OT Goal Formulation: With patient Time For Goal Achievement: 10/13/18 Potential to Achieve Goals: Good  OT Frequency: Min 2X/week    Barriers to D/C:            Co-evaluation              AM-PAC OT "6 Clicks" Daily Activity     Outcome Measure Help from another person eating meals?: None Help from another person taking care of personal grooming?: None Help from another person toileting, which includes using toliet, bedpan, or urinal?: None Help from another person bathing (including washing, rinsing, drying)?: None Help from another person to put on and taking off regular upper body clothing?: None Help from another person to put on and taking off regular lower body clothing?: A Little 6 Click Score: 23   End of Session Nurse Communication: Mobility status  Activity Tolerance: Patient tolerated treatment well Patient left: in chair;with call bell/phone within reach  OT Visit Diagnosis: Muscle weakness (generalized) (M62.81);Other symptoms and signs involving the nervous system (R29.898)                Time: 7939-0300 OT Time Calculation (min): 25 min Charges:  OT General Charges $OT Visit: 1 Visit OT Evaluation $OT Eval Moderate Complexity: 1 Mod OT Treatments $Self Care/Home Management : 8-22 mins  Lou Cal, OT [facility] Pager [phone] Office [phone]   [name] 09/29/2018, 1:18 PM

## 2018-09-29 NOTE — Progress Notes (Signed)
Pt. Off the floor for MIR

## 2018-09-29 NOTE — H&P (Addendum)
TRH H&P    Patient Demographics:    Shaun Ball, is a 68 y.o. male  MRN: [id number]  DOB - 31-Mar-1950  Admit Date - 09/28/2018  Referring MD/NP/PA:   Nuala Alpha  Outpatient Primary MD for the patient is Reynold Bowen, MD  Patient coming from:  home  Chief complaint- confusion   HPI:    Shaun Ball  is a 68 y.o. male, w hypertension, hyperlipidemia, + family hx of CVA, abnormal liver function, h/o bladder cancer,  presents with confusion since Friday morning.  Son states that his fathers vision might be worse than normal .  Pt denies headache , slurred speech, cp, palp, sob, focal numbness, tingling, weakness.    In ED,  T 98.6, P 70 R 16, Bp 142/71  Pox 96% on RA   CT brain IMPRESSION: 1. Findings suspicious for multifocal subacute ischemia throughout the right cerebral hemisphere. Recommend MRI for further characterization to exclude the possibility of underlying lesions. 2. No hemorrhage. No hyperdense vessel, with special attention to the right MCA.   Na 135, K 4.4, Bun 14, Creatinine 1.09 Ast 38, Alt 69 Wbc 10.1, Hgb 16.8, Plt 192 Urinalysis negative covid -19 pending  Ed spoke with neurology  Pt will be admitted for [facility] for evaluation of stroke    Review of systems:    In addition to the HPI above,  No Fever-chills, No Headache, No changes with Vision or hearing, No problems swallowing food or Liquids, No Chest pain, Cough or Shortness of Breath, No Abdominal pain, No Nausea or Vomiting, bowel movements are regular, No Blood in stool or Urine, No dysuria, No new skin rashes or bruises, No new joints pains-aches,  No new weakness, tingling, numbness in any extremity, No recent weight gain or loss, No polyuria, polydypsia or polyphagia, No significant Mental Stressors.  All other systems reviewed and are negative.    Past History of the following :    Past  Medical History:  Diagnosis Date  . Arthritis   . Bladder cancer ([place])   . Dysuria   . History of kidney stones 2012   passed / has renal cyst to evaluated at later time  . Hypertension   . Lesion of bladder   . Pneumonia    hx of   . PONV (postoperative nausea and vomiting)    10 yrs ago  no problems with last 2 surgeries  . Wears glasses       Past Surgical History:  Procedure Laterality Date  . CYSTOSCOPY N/A 11/05/2016   Procedure: CYSTOSCOPY;  Surgeon: Ceasar Mons, MD;  Location: [facility];  Service: Urology;  Laterality: N/A;  . CYSTOSCOPY WITH BIOPSY Bilateral 10/24/2016   Procedure: CYSTOSCOPY WITH BLADDER BIOPSY/ BILATERAL RETROGRADE [id number];  Surgeon: Ceasar Mons, MD;  Location: [facility];  Service: Urology;  Laterality: Bilateral;  . CYSTOSCOPY WITH BIOPSY N/A 05/12/2018   Procedure: CYSTOSCOPY WITH BLADDER BIOPSY/ BILATERAL RETROGRADE PYELOGRAM BIOPSY;  Surgeon: Ceasar Mons, MD;  Location: [facility];  Service: Urology;  Laterality: N/A;  . 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 fattytumorremoved from back     . TRANSURETHRAL RESECTION OF BLADDER TUMOR N/A 11/05/2016   Procedure: TRANSURETHRAL RESECTION OF BLADDER TUMOR (TURBT);  Surgeon: Ceasar Mons, MD;  Location: [facility];  Service: Urology;  Laterality: N/A;      Social History:      Social History   Tobacco Use  . Smoking status: Former Smoker    Years: 28.00    Types: Cigarettes    Quit date: 10/22/1999    Years since quitting: 18.9  . Smokeless tobacco: Never Used  Substance Use Topics  . Alcohol use: Yes    Alcohol/week: 7.0 standard drinks    Types: 7 Cans of beer per week    Comment: occ        Family History :     Family History  Problem Relation Age of Onset  . Blindness Mother   . Cataracts Mother   .  Diabetes Mother   . Glaucoma Mother   . Macular degeneration Mother   . Cataracts Father   . Diabetes Father   . Diabetes Sister   . Diabetes Brother   . Blindness Maternal Uncle   . Macular degeneration Maternal Uncle   . Macular degeneration Paternal Aunt   . Amblyopia Neg Hx   . Retinal detachment Neg Hx   . Strabismus Neg Hx   . Retinitis pigmentosa Neg Hx       Home Medications:   Prior to Admission medications   Medication Sig Start Date End Date Taking? Authorizing Provider  fluticasone (FLONASE) 50 MCG/ACT nasal spray Place 1 spray into both nostrils as needed for allergies or rhinitis.   Yes [provider]  lisinopril (PRINIVIL,ZESTRIL) 40 MG tablet Take 40 mg by mouth every evening.    Yes [provider]  montelukast (SINGULAIR) 10 MG tablet Take 10 mg by mouth daily as needed (allergies).  07/14/17  Yes [provider]  phenazopyridine (PYRIDIUM) 200 MG tablet Take 1 tablet (200 mg total) by mouth 3 (three) times daily as needed (for pain with urination). 05/12/18 05/12/19 Yes Ceasar Mons, MD  sulfamethoxazole-trimethoprim (BACTRIM DS,SEPTRA DS) 800-160 MG tablet Take 1 tablet by mouth 2 (two) times daily. Patient taking differently: Take 1 tablet by mouth once.  05/12/18  Yes Ceasar Mons, MD  tamsulosin (FLOMAX) 0.4 MG CAPS capsule Take 1 capsule (0.4 mg total) by mouth daily. Patient taking differently: Take 0.4 mg by mouth daily after breakfast.  10/24/16  Yes Ceasar Mons, MD  acetaminophen (TYLENOL) 500 MG tablet Take 500 mg by mouth every 6 (six) hours as needed for moderate pain.    [provider]  HYDROcodone-acetaminophen (NORCO/VICODIN) 5-325 MG tablet Take 1 tablet by mouth every 6 (six) hours as needed for moderate pain. Patient not taking: Reported on 05/07/2018 11/05/16   Ceasar Mons, MD  oxybutynin (DITROPAN) 5 MG tablet Take 1 tablet (5 mg total) by mouth every 8 (eight) hours  as needed for bladder spasms. Patient not taking: Reported on 09/28/2018 05/12/18   Ceasar Mons, MD     Allergies:     Allergies  Allergen Reactions  . Statins Hives and Rash    whelps,liver enzymes elevated     Physical Exam:   Vitals  Blood pressure 139/81, pulse 67, temperature 98.6 F (37 C), temperature source Oral,  resp. rate 15, SpO2 99 %.  1.  General: axoxo3 Answers questions appropriately,  Recalls that used to work with [employer]  2. Psychiatric: euthymic  3. Neurologic: cn2-12 intact, reflexes 2+ symmetric, diffuse with no clonus, motor 5/5 in all 4 ext ? Slight left facial droop  4. HEENMT:  Anicteric, pupils 1.47mm symmetric, direct, consensual, near intact Tongue deviates slightly to the left Neck: no jvd  5. Respiratory : CTAB  6. Cardiovascular : rrr s1, s2,  1/6 sem rusb  7. Gastrointestinal:  Abd: soft, nt, nd, +bs  8. Skin:  Ext: no c/c/e, no rash  9.Musculoskeletal:  Good ROM,  No adenopathy    Data Review:    CBC Recent Labs  Lab 09/28/18 1818  WBC 10.1  HGB 16.8  HCT 50.8  PLT 192  MCV 91.9  MCH 30.4  MCHC 33.1  RDW 12.6   ------------------------------------------------------------------------------------------------------------------  Results for orders placed or performed during the hospital encounter of 09/28/18 (from the past 48 hour(s))  Lipase, blood     Status: None   Collection Time: 09/28/18  6:18 PM  Result Value Ref Range   Lipase 34 11 - 51 U/L    Comment: Performed at [facility], [place] [address] [place], [place] [address]  Comprehensive metabolic panel     Status: Abnormal   Collection Time: 09/28/18  6:18 PM  Result Value Ref Range   Sodium 135 135 - 145 mmol/L   Potassium 4.4 3.5 - 5.1 mmol/L   Chloride 101 98 - 111 mmol/L   CO2 23 22 - 32 mmol/L   Glucose, Bld 110 (H) 70 - 99 mg/dL   BUN 14 8 - 23 mg/dL   Creatinine, Ser 1.09 0.61 - 1.24 mg/dL   Calcium 9.6 8.9 -  10.3 mg/dL   Total Protein 8.1 6.5 - 8.1 g/dL   Albumin 4.6 3.5 - 5.0 g/dL   AST 38 15 - 41 U/L   ALT 69 (H) 0 - 44 U/L   Alkaline Phosphatase 61 38 - 126 U/L   Total Bilirubin 0.9 0.3 - 1.2 mg/dL   GFR calc non Af Amer >60 >60 mL/min   GFR calc Af Amer >60 >60 mL/min   Anion gap 11 5 - 15    Comment: Performed at [facility], [place] [address], [place], [place] [address]  CBC     Status: None   Collection Time: 09/28/18  6:18 PM  Result Value Ref Range   WBC 10.1 4.0 - 10.5 K/uL   RBC 5.53 4.22 - 5.81 MIL/uL   Hemoglobin 16.8 13.0 - 17.0 g/dL   HCT 50.8 39.0 - 52.0 %   MCV 91.9 80.0 - 100.0 fL   MCH 30.4 26.0 - 34.0 pg   MCHC 33.1 30.0 - 36.0 g/dL   RDW 12.6 11.5 - 15.5 %   Platelets 192 150 - 400 K/uL   nRBC 0.0 0.0 - 0.2 %    Comment: Performed at [facility], [place] [address], [place],  [address]  Urinalysis, Routine w reflex microscopic     Status: Abnormal   Collection Time: 09/28/18  7:45 PM  Result Value Ref Range   Color, Urine AMBER (A) YELLOW    Comment: BIOCHEMICALS MAY BE AFFECTED BY COLOR   APPearance CLEAR CLEAR   Specific Gravity, Urine 1.010 1.005 - 1.030   pH 6.0 5.0 - 8.0   Glucose, UA NEGATIVE NEGATIVE mg/dL   Hgb urine dipstick  NEGATIVE NEGATIVE   Bilirubin Urine NEGATIVE NEGATIVE   Ketones, ur NEGATIVE NEGATIVE mg/dL   Protein, ur NEGATIVE NEGATIVE mg/dL   Nitrite POSITIVE (A) NEGATIVE   Leukocytes,Ua NEGATIVE NEGATIVE   RBC / HPF 0-5 0 - 5 RBC/hpf   WBC, UA 0-5 0 - 5 WBC/hpf   Bacteria, UA NONE SEEN NONE SEEN    Comment: Performed at [facility], [address] [place], [place] [address]    Chemistries  Recent Labs  Lab 09/28/18 1818  NA 135  K 4.4  CL 101  CO2 23  GLUCOSE 110*  BUN 14  CREATININE 1.09  CALCIUM 9.6  AST 38  ALT 69*  ALKPHOS 61  BILITOT 0.9    ------------------------------------------------------------------------------------------------------------------  ------------------------------------------------------------------------------------------------------------------ GFR: CrCl cannot be calculated (Unknown ideal weight.). Liver Function Tests: Recent Labs  Lab 09/28/18 1818  AST 38  ALT 69*  ALKPHOS 61  BILITOT 0.9  PROT 8.1  ALBUMIN 4.6   Recent Labs  Lab 09/28/18 1818  LIPASE 34   No results for input(s): AMMONIA in the last 168 hours. Coagulation Profile: No results for input(s): INR, PROTIME in the last 168 hours. Cardiac Enzymes: No results for input(s): CKTOTAL, CKMB, CKMBINDEX, TROPONINI in the last 168 hours. BNP (last 3 results) No results for input(s): PROBNP in the last 8760 hours. HbA1C: No results for input(s): HGBA1C in the last 72 hours. CBG: No results for input(s): GLUCAP in the last 168 hours. Lipid Profile: No results for input(s): CHOL, HDL, LDLCALC, TRIG, CHOLHDL, LDLDIRECT in the last 72 hours. Thyroid Function Tests: No results for input(s): TSH, T4TOTAL, FREET4, T3FREE, THYROIDAB in the last 72 hours. Anemia Panel: No results for input(s): VITAMINB12, FOLATE, FERRITIN, TIBC, IRON, RETICCTPCT in the last 72 hours.  --------------------------------------------------------------------------------------------------------------- Urine analysis:    Component Value Date/Time   COLORURINE AMBER (A) 09/28/2018 1945   APPEARANCEUR CLEAR 09/28/2018 1945   LABSPEC 1.010 09/28/2018 1945   PHURINE 6.0 09/28/2018 1945   GLUCOSEU NEGATIVE 09/28/2018 1945   HGBUR NEGATIVE 09/28/2018 1945   BILIRUBINUR NEGATIVE 09/28/2018 1945   KETONESUR NEGATIVE 09/28/2018 1945   PROTEINUR NEGATIVE 09/28/2018 1945   NITRITE POSITIVE (A) 09/28/2018 1945   LEUKOCYTESUR NEGATIVE 09/28/2018 1945      Imaging Results:    Ct Head Wo Contrast  Result Date: 09/28/2018 CLINICAL DATA:  Altered level of  consciousness. EXAM: CT HEAD WITHOUT CONTRAST TECHNIQUE: Contiguous axial images were obtained from the base of the skull through the vertex without intravenous contrast. COMPARISON:  None. FINDINGS: Brain: Moderate region of ill-defined cortical low-density in the right occipital and posterior temporal region, nonspecific but suspicious for subacute ischemia. Small ill-defined cortical low-density in the right frontal lobe. Low-density in the right basal ganglia suspicious for chronic or subacute ischemia. No hemorrhage. No midline shift, hydrocephalus, or subdural/extra-axial collection. Cerebellum is unremarkable. Vascular: No hyperdense vessel, with special attention to the right MCA. Mild atherosclerosis of skull base vasculature. Skull: No fracture or focal lesion. Sinuses/Orbits: Paranasal sinuses and mastoid air cells are clear. The visualized orbits are unremarkable. Other: None. IMPRESSION: 1. Findings suspicious for multifocal subacute ischemia throughout the right cerebral hemisphere. Recommend MRI for further characterization to exclude the possibility of underlying lesions. 2. No hemorrhage. No hyperdense vessel, with special attention to the right MCA. Electronically Signed   By: Keith Rake M.D.   On: 09/28/2018 23:01   Dg Chest Portable 1 View  Result Date: 09/28/2018 CLINICAL DATA:  Flank pain. EXAM: PORTABLE CHEST 1 VIEW  COMPARISON:  None. FINDINGS: Cardiomediastinal silhouette is normal. Mediastinal contours appear intact. There is no evidence of focal airspace consolidation, pleural effusion or pneumothorax. Osseous structures are without acute abnormality. Soft tissues are grossly normal. IMPRESSION: No active disease. Electronically Signed   By: Fidela Salisbury M.D.   On: 09/28/2018 20:15   Ct Renal Stone Study  Result Date: 09/28/2018 CLINICAL DATA:  Flank pain. EXAM: CT ABDOMEN AND PELVIS WITHOUT CONTRAST TECHNIQUE: Multidetector CT imaging of the abdomen and pelvis was  performed following the standard protocol without IV contrast. COMPARISON:  CT abdomen dated 10/01/2016. FINDINGS: Lower chest: No acute abnormality. Hepatobiliary: Liver is diffusely low in density indicating fatty infiltration. Gallbladder is unremarkable, partially contracted. No bile duct dilatation. Pancreas: Unremarkable. No pancreatic ductal dilatation or surrounding inflammatory changes. Spleen: Normal in size without focal abnormality. Adrenals/Urinary Tract: Adrenal glands appear normal. RIGHT renal cyst. Indeterminate mass exophytic to the lower pole of the LEFT kidney is stable for 2 years suggesting benignity, considered benign per MRI report of 04/24/2017. Kidneys otherwise unremarkable without suspicious mass, stone or hydronephrosis. No ureteral or bladder calculi identified. Bladder appears normal. Stomach/Bowel: No dilated large or small bowel loops. No evidence of bowel wall inflammation. Appendix appears normal. Stomach is unremarkable, partially decompressed. Vascular/Lymphatic: Aortic atherosclerosis. No enlarged lymph nodes seen in the abdomen or pelvis. Reproductive: Prostate gland is upper normal in size. Other: No free fluid or abscess collection. No free intraperitoneal air. Musculoskeletal: No acute or suspicious osseous finding. Degenerative spondylosis of the thoracolumbar spine, mild to moderate in degree. Bilateral inguinal hernias, LEFT greater than RIGHT, containing fat only. IMPRESSION: 1. No acute findings within the abdomen or pelvis. No renal or ureteral calculi. No bowel obstruction or evidence of bowel wall inflammation. No evidence of acute solid organ abnormality. No free fluid or inflammatory change. Appendix is normal. 2. Fatty infiltration of the liver. 3. Degenerative spondylosis of the thoracolumbar spine, most prominent at the L3-4 and L4-5 levels where there are moderate to severe central canal stenoses secondary to a combination of disc bulge and degenerative facet  hypertrophy. This is a potential source for patient's flank pain. Consider nonemergent lumbar spine MRI for further characterization. 4. Additional chronic/incidental findings detailed above. Aortic Atherosclerosis (ICD10-I70.0). Electronically Signed   By: Franki Cabot M.D.   On: 09/28/2018 20:25   ekg none   Assessment & Plan:    Principal Problem:   Stroke [facility]) Active Problems:   Essential hypertension   Abnormal liver function  Stroke Tele Check MRI brain Check carotid ultrasound Check cardiac echo Check hga1c, lipid PT/OT/ speech consult Aspirin Allerghic to statins=> whelps Start Zetia 10mg  po qday Neurology consulted by ED, appreciate input  Hypertension No need for permissive hypertension since symptoms started 5 days ago Cont lisinopril 40mg  po qday  Abnormal liver function Check RUQ ultrasound Check acute hepatitis panel Check cpk Check cmp in am  Bph Cont flomax 0.4mg  po qhs Please inquire if taking Bactrim, ? In am   DVT Prophylaxis-  SCDs   AM Labs Ordered, also please review Full Orders  Family Communication: Admission, patients condition and plan of care including tests being ordered have been discussed with the patient who indicate understanding and agree with the plan and Code Status.  Code Status:  FULL CODE  Admission status:  /Inpatient: Based on patients clinical presentation and evaluation of above clinical data, I have made determination that patient meets Inpatient criteria at this time.  Pt has stroke and will require >2  nites stay for w/up   Time spent in minutes : 70   Jani Gravel M.D on 09/29/2018 at 12:16 AM

## 2018-09-29 NOTE — Progress Notes (Signed)
Patient Arrived to the unit from the ED alert and oriented X 4 Denies Pain Skin clean dry and intact, R anterior leg abrasion Place on the Tele box #16 All questions and concern addressed Bed in the lowest position with alarm set. Call light in reach.

## 2018-09-30 ENCOUNTER — Inpatient Hospital Stay (HOSPITAL_COMMUNITY): Payer: Medicare Other

## 2018-09-30 DIAGNOSIS — R410 Disorientation, unspecified: Secondary | ICD-10-CM

## 2018-09-30 DIAGNOSIS — M48061 Spinal stenosis, lumbar region without neurogenic claudication: Secondary | ICD-10-CM

## 2018-09-30 DIAGNOSIS — I639 Cerebral infarction, unspecified: Secondary | ICD-10-CM

## 2018-09-30 LAB — COMPREHENSIVE METABOLIC PANEL
ALT: 61 U/L — ABNORMAL HIGH (ref 0–44)
AST: 36 U/L (ref 15–41)
Albumin: 3.6 g/dL (ref 3.5–5.0)
Alkaline Phosphatase: 52 U/L (ref 38–126)
Anion gap: 8 (ref 5–15)
BUN: 11 mg/dL (ref 8–23)
CO2: 25 mmol/L (ref 22–32)
Calcium: 8.8 mg/dL — ABNORMAL LOW (ref 8.9–10.3)
Chloride: 105 mmol/L (ref 98–111)
Creatinine, Ser: 1.14 mg/dL (ref 0.61–1.24)
GFR calc Af Amer: >60 mL/min (ref >60)
GFR calc non Af Amer: >60 mL/min (ref >60)
Glucose, Bld: 167 mg/dL — ABNORMAL HIGH (ref 70–99)
Potassium: 4 mmol/L (ref 3.5–5.1)
Sodium: 138 mmol/L (ref 135–145)
Total Bilirubin: 1.3 mg/dL — ABNORMAL HIGH (ref 0.3–1.2)
Total Protein: 6.5 g/dL (ref 6.5–8.1)

## 2018-09-30 LAB — URINE CULTURE: Culture: NO GROWTH

## 2018-09-30 LAB — HEPATITIS PANEL, ACUTE
HCV Ab: <0.1 s/co ratio (ref 0.0–0.9)
Hep A IgM: NEGATIVE
Hep B C IgM: NEGATIVE
Hepatitis B Surface Ag: NEGATIVE

## 2018-09-30 MED ORDER — SODIUM CHLORIDE 0.9 % IV SOLN
INTRAVENOUS | Status: DC
  Administered 2018-10-01: 13:00:00 via INTRAVENOUS

## 2018-09-30 NOTE — H&P (View-Only) (Signed)
STROKE TEAM PROGRESS NOTE   INTERVAL HISTORY Patient presented with 1 week history of acute confusion and disorientation likely from embolic right MCA infarct.  CT angiogram showed 50% proximal right carotid stenosis which could be the source but I am not certain.  2D echo is unremarkable.  May need to check carotid ultrasound and if there is unstable plaque or greater than 60% stenosis may consider carotid revascularization but if ultrasound shows even less significant stenosis may need to consider TEE and loop recorder to look for further cardiac source of embolism  Vitals:   09/30/18 0326 09/30/18 0600 09/30/18 0831 09/30/18 1210  BP: 123/72  135/74 126/71  Pulse: 62  68 68  Resp: 17     Temp: 97.8 F (36.6 C)  97.9 F (36.6 C) 97.7 F (36.5 C)  TempSrc:   Oral Oral  SpO2: 96%  98% 97%  Weight:  84 kg    Height:  5\' 10"  (1.778 m)      CBC:  Recent Labs  Lab 09/28/18 1818 09/29/18 0533  WBC 10.1 9.7  HGB 16.8 15.7  HCT 50.8 47.4  MCV 91.9 91.9  PLT 192 222    Basic Metabolic Panel:  Recent Labs  Lab 09/29/18 0533 09/30/18 1004  NA 136 138  K 4.2 4.0  CL 106 105  CO2 26 25  GLUCOSE 105* 167*  BUN 13 11  CREATININE 1.05 1.14  CALCIUM 8.8* 8.8*   Lipid Panel:     Component Value Date/Time   CHOL 289 (H) 09/29/2018 0533   TRIG 181 (H) 09/29/2018 0533   HDL 34 (L) 09/29/2018 0533   CHOLHDL 8.5 09/29/2018 0533   VLDL 36 09/29/2018 0533   LDLCALC 219 (H) 09/29/2018 0533   HgbA1c:  Lab Results  Component Value Date   HGBA1C 6.0 (H) 09/29/2018   Urine Drug Screen: No results found for: LABOPIA, COCAINSCRNUR, LABBENZ, AMPHETMU, THCU, LABBARB  Alcohol Level No results found for: ETH  IMAGING Ct Angio Head W Or Wo Contrast  Result Date: 09/29/2018 CLINICAL DATA:  Stroke, follow-up. Additional history: Confusion for the last 5 days, forgetfulness, difficulty using phone, son concern for visual changes. EXAM: CT ANGIOGRAPHY HEAD AND NECK TECHNIQUE: Multidetector  CT imaging of the head and neck was performed using the standard protocol during bolus administration of intravenous contrast. Multiplanar CT image reconstructions and MIPs were obtained to evaluate the vascular anatomy. Carotid stenosis measurements (when applicable) are obtained utilizing NASCET criteria, using the distal internal carotid diameter as the denominator. CONTRAST:  127mL OMNIPAQUE IOHEXOL 350 MG/ML SOLN COMPARISON:  Head CT 09/28/2018 FINDINGS: CTA NECK FINDINGS Aortic arch: Standard branching. Atherosclerotic calcification within the imaged aortic arch and major branch vessels. No high-grade narrowing of the major branch vessel origins. Right carotid system: Atherosclerotic plaque at the right carotid bifurcation with less than 50% stenosis of the proximal right cervical internal carotid artery. Left carotid system: Atherosclerotic plaque at the left carotid bifurcation without significant stenosis (50% or greater). Calcified plaque also present within the distal cervical left ICA without significant stenosis (50% or greater). Vertebral arteries: Scattered atherosclerotic plaque within the bilateral cervical vertebral arteries without significant stenosis (50% or greater). Skeleton: Cervical spondylosis without high-grade bony spinal canal stenosis. No suspicious lytic or blastic osseous lesions. Other neck: No soft tissue neck mass or pathologically enlarged cervical chain lymph nodes. Upper chest: No confluent consolidation within the imaged lung apices. Review of the MIP images confirms the above findings CTA HEAD FINDINGS Anterior circulation:  Calcified and noncalcified plaque within the bilateral carotid siphons with no more than mild luminal narrowing. The right middle and anterior cerebral arteries are patent without significant proximal stenosis. The left middle and anterior cerebral arteries are patent without significant proximal stenosis. No anterior circulation aneurysm is identified.  Posterior circulation: Scattered atherosclerotic plaque within the intracranial vertebrobasilar system. Streak artifact from dental restoration limits evaluation of the non dominant right vertebral artery at the level of the skull base, and stenosis at the level of the foramen magnum is difficult to exclude. Mild/moderate narrowing of the distal right vertebral artery just proximal to the vertebrobasilar junction. The left vertebral artery is patent without significant stenosis. Atherosclerotic irregularity of the basilar artery with mild luminal narrowing of the proximal basilar artery. The bilateral posterior cerebral arteries are patent without significant proximal stenosis. Posterior communicating arteries are present bilaterally. No posterior circulation aneurysm is identified. Venous sinuses: Within the limitations of contrast timing, no evidence of thrombosis. Anatomic variants: None of significance. Other: Right maxillary sinus mucous retention cyst. Review of the MIP images confirms the above findings IMPRESSION: CTA HEAD: Streak artifact from dental restoration limits evaluation of the distal right vertebral artery at the level of the foramen magnum, and a stenosis at this site cannot be excluded. Intracranial atherosclerotic disease. Mild/moderate narrowing of the non dominant distal right vertebral artery just proximal to the vertebrobasilar junction. Additional sites of mild atherosclerotic luminal narrowing within the basilar and bilateral intracranial internal carotid arteries. Parenchymal hypodensities within the right cerebral cortex suspicious for multifocal subacute infarcts were better appreciated on prior head CT 09/28/2018. Consider brain MRI for further evaluation. No intracranial aneurysm identified. CTA NECK: The common carotid, cervical internal carotid and cervical vertebral arteries are patent within the neck. Atherosclerotic disease results in less than 50% narrowing of the proximal  cervical right ICA. Electronically Signed   By: Kellie Simmering   On: 09/29/2018 09:03   Ct Head Wo Contrast  Result Date: 09/28/2018 CLINICAL DATA:  Altered level of consciousness. EXAM: CT HEAD WITHOUT CONTRAST TECHNIQUE: Contiguous axial images were obtained from the base of the skull through the vertex without intravenous contrast. COMPARISON:  None. FINDINGS: Brain: Moderate region of ill-defined cortical low-density in the right occipital and posterior temporal region, nonspecific but suspicious for subacute ischemia. Small ill-defined cortical low-density in the right frontal lobe. Low-density in the right basal ganglia suspicious for chronic or subacute ischemia. No hemorrhage. No midline shift, hydrocephalus, or subdural/extra-axial collection. Cerebellum is unremarkable. Vascular: No hyperdense vessel, with special attention to the right MCA. Mild atherosclerosis of skull base vasculature. Skull: No fracture or focal lesion. Sinuses/Orbits: Paranasal sinuses and mastoid air cells are clear. The visualized orbits are unremarkable. Other: None. IMPRESSION: 1. Findings suspicious for multifocal subacute ischemia throughout the right cerebral hemisphere. Recommend MRI for further characterization to exclude the possibility of underlying lesions. 2. No hemorrhage. No hyperdense vessel, with special attention to the right MCA. Electronically Signed   By: Keith Rake M.D.   On: 09/28/2018 23:01   Ct Angio Neck W Or Wo Contrast  Result Date: 09/29/2018 CLINICAL DATA:  Stroke, follow-up. Additional history: Confusion for the last 5 days, forgetfulness, difficulty using phone, son concern for visual changes. EXAM: CT ANGIOGRAPHY HEAD AND NECK TECHNIQUE: Multidetector CT imaging of the head and neck was performed using the standard protocol during bolus administration of intravenous contrast. Multiplanar CT image reconstructions and MIPs were obtained to evaluate the vascular anatomy. Carotid stenosis  measurements (when applicable) are  obtained utilizing NASCET criteria, using the distal internal carotid diameter as the denominator. CONTRAST:  111mL OMNIPAQUE IOHEXOL 350 MG/ML SOLN COMPARISON:  Head CT 09/28/2018 FINDINGS: CTA NECK FINDINGS Aortic arch: Standard branching. Atherosclerotic calcification within the imaged aortic arch and major branch vessels. No high-grade narrowing of the major branch vessel origins. Right carotid system: Atherosclerotic plaque at the right carotid bifurcation with less than 50% stenosis of the proximal right cervical internal carotid artery. Left carotid system: Atherosclerotic plaque at the left carotid bifurcation without significant stenosis (50% or greater). Calcified plaque also present within the distal cervical left ICA without significant stenosis (50% or greater). Vertebral arteries: Scattered atherosclerotic plaque within the bilateral cervical vertebral arteries without significant stenosis (50% or greater). Skeleton: Cervical spondylosis without high-grade bony spinal canal stenosis. No suspicious lytic or blastic osseous lesions. Other neck: No soft tissue neck mass or pathologically enlarged cervical chain lymph nodes. Upper chest: No confluent consolidation within the imaged lung apices. Review of the MIP images confirms the above findings CTA HEAD FINDINGS Anterior circulation: Calcified and noncalcified plaque within the bilateral carotid siphons with no more than mild luminal narrowing. The right middle and anterior cerebral arteries are patent without significant proximal stenosis. The left middle and anterior cerebral arteries are patent without significant proximal stenosis. No anterior circulation aneurysm is identified. Posterior circulation: Scattered atherosclerotic plaque within the intracranial vertebrobasilar system. Streak artifact from dental restoration limits evaluation of the non dominant right vertebral artery at the level of the skull base, and  stenosis at the level of the foramen magnum is difficult to exclude. Mild/moderate narrowing of the distal right vertebral artery just proximal to the vertebrobasilar junction. The left vertebral artery is patent without significant stenosis. Atherosclerotic irregularity of the basilar artery with mild luminal narrowing of the proximal basilar artery. The bilateral posterior cerebral arteries are patent without significant proximal stenosis. Posterior communicating arteries are present bilaterally. No posterior circulation aneurysm is identified. Venous sinuses: Within the limitations of contrast timing, no evidence of thrombosis. Anatomic variants: None of significance. Other: Right maxillary sinus mucous retention cyst. Review of the MIP images confirms the above findings IMPRESSION: CTA HEAD: Streak artifact from dental restoration limits evaluation of the distal right vertebral artery at the level of the foramen magnum, and a stenosis at this site cannot be excluded. Intracranial atherosclerotic disease. Mild/moderate narrowing of the non dominant distal right vertebral artery just proximal to the vertebrobasilar junction. Additional sites of mild atherosclerotic luminal narrowing within the basilar and bilateral intracranial internal carotid arteries. Parenchymal hypodensities within the right cerebral cortex suspicious for multifocal subacute infarcts were better appreciated on prior head CT 09/28/2018. Consider brain MRI for further evaluation. No intracranial aneurysm identified. CTA NECK: The common carotid, cervical internal carotid and cervical vertebral arteries are patent within the neck. Atherosclerotic disease results in less than 50% narrowing of the proximal cervical right ICA. Electronically Signed   By: Kellie Simmering   On: 09/29/2018 09:03   Mr Brain Wo Contrast  Addendum Date: 09/29/2018   ADDENDUM REPORT: 09/29/2018 11:12 ADDENDUM: These results were called by telephone at the time of  interpretation on 09/29/2018 at 11:11 am to Dr. Lupita Raider, who verbally acknowledged these results. Electronically Signed   By: Kellie Simmering   On: 09/29/2018 11:12   Result Date: 09/29/2018 CLINICAL DATA:  Focal neuro deficit, greater than 6 hours, stroke suspected. EXAM: MRI HEAD WITHOUT CONTRAST TECHNIQUE: Multiplanar, multiecho pulse sequences of the brain and surrounding structures were obtained without  intravenous contrast. COMPARISON:  CT angiography head/neck performed earlier the same day 09/29/2018, head CT 09/28/2018 FINDINGS: Brain: Multiple sequences are motion degraded. There are several acute/subacute infarcts within the right basal ganglia, the largest within the posterior limb of right internal and posterior lentiform nucleus measuring 1.6 x 0.9 cm. Smaller acute infarcts present within the right caudate nucleus and right external capsule. There are multiple additional small acute/subacute cortical and white matter infarcts within the right frontal lobe, the largest involving the right superior frontal gyrus. Moderate-sized acute/subacute infarct within the right temporal occipital lobe, measuring. 4.2 x 2.6 cm. There are multiple smaller acute/subacute infarcts more posteriorly within the right occipital and parietal lobes. Corresponding T2/FLAIR hyperintensity at these sites. Mild regional mass effect associated with the larger infarcts. No midline shift. Small amount of irregular SWI signal loss associated with the dominant right temporal occipital lobe infarct, likely reflecting petechial hemorrhage. Additional moderate scattered and confluent T2/FLAIR hyperintensity within the bilateral cerebral white matter consistent with chronic small vessel ischemic disease. Small chronic lacunar infarcts within the bilateral thalami. Cerebral volume is age appropriate. Vascular: Flow voids maintained within the proximal large arterial vessels. The distal right vertebral artery is non dominant. Skull  and upper cervical spine: Normal marrow signal. Sinuses/Orbits: The imaged globes and orbits demonstrate no acute abnormality. Mild ethmoid sinus mucosal thickening. Small right maxillary sinus mucous retention cyst. No significant mastoid effusion. IMPRESSION: Moderate-sized acute/subacute infarct within the right temporal occipital lobe with associated mild petechial hemorrhage. Numerous additional small right MCA vascular territory acute/subacute infarcts involving the right frontal, parietal and occipital lobes. Multiple acute/subacute infarcts also present within the right basal ganglia. Moderate chronic small vessel ischemic disease. Electronically Signed: By: Kellie Simmering On: 09/29/2018 11:01   Dg Chest Portable 1 View  Result Date: 09/28/2018 CLINICAL DATA:  Flank pain. EXAM: PORTABLE CHEST 1 VIEW COMPARISON:  None. FINDINGS: Cardiomediastinal silhouette is normal. Mediastinal contours appear intact. There is no evidence of focal airspace consolidation, pleural effusion or pneumothorax. Osseous structures are without acute abnormality. Soft tissues are grossly normal. IMPRESSION: No active disease. Electronically Signed   By: Fidela Salisbury M.D.   On: 09/28/2018 20:15   Ct Renal Stone Study  Result Date: 09/28/2018 CLINICAL DATA:  Flank pain. EXAM: CT ABDOMEN AND PELVIS WITHOUT CONTRAST TECHNIQUE: Multidetector CT imaging of the abdomen and pelvis was performed following the standard protocol without IV contrast. COMPARISON:  CT abdomen dated 10/01/2016. FINDINGS: Lower chest: No acute abnormality. Hepatobiliary: Liver is diffusely low in density indicating fatty infiltration. Gallbladder is unremarkable, partially contracted. No bile duct dilatation. Pancreas: Unremarkable. No pancreatic ductal dilatation or surrounding inflammatory changes. Spleen: Normal in size without focal abnormality. Adrenals/Urinary Tract: Adrenal glands appear normal. RIGHT renal cyst. Indeterminate mass exophytic to  the lower pole of the LEFT kidney is stable for 2 years suggesting benignity, considered benign per MRI report of 04/24/2017. Kidneys otherwise unremarkable without suspicious mass, stone or hydronephrosis. No ureteral or bladder calculi identified. Bladder appears normal. Stomach/Bowel: No dilated large or small bowel loops. No evidence of bowel wall inflammation. Appendix appears normal. Stomach is unremarkable, partially decompressed. Vascular/Lymphatic: Aortic atherosclerosis. No enlarged lymph nodes seen in the abdomen or pelvis. Reproductive: Prostate gland is upper normal in size. Other: No free fluid or abscess collection. No free intraperitoneal air. Musculoskeletal: No acute or suspicious osseous finding. Degenerative spondylosis of the thoracolumbar spine, mild to moderate in degree. Bilateral inguinal hernias, LEFT greater than RIGHT, containing fat only. IMPRESSION: 1. No acute findings  within the abdomen or pelvis. No renal or ureteral calculi. No bowel obstruction or evidence of bowel wall inflammation. No evidence of acute solid organ abnormality. No free fluid or inflammatory change. Appendix is normal. 2. Fatty infiltration of the liver. 3. Degenerative spondylosis of the thoracolumbar spine, most prominent at the L3-4 and L4-5 levels where there are moderate to severe central canal stenoses secondary to a combination of disc bulge and degenerative facet hypertrophy. This is a potential source for patient's flank pain. Consider nonemergent lumbar spine MRI for further characterization. 4. Additional chronic/incidental findings detailed above. Aortic Atherosclerosis (ICD10-I70.0). Electronically Signed   By: Franki Cabot M.D.   On: 09/28/2018 20:25   US Abdomen Limited Ruq  Result Date: 09/29/2018 CLINICAL DATA:  Abnormal LFTs. EXAM: ULTRASOUND ABDOMEN LIMITED RIGHT UPPER QUADRANT COMPARISON:  Noncontrast CT yesterday. FINDINGS: Gallbladder: Physiologically distended. No gallstones or wall  thickening visualized. No sonographic Murphy sign noted by sonographer. Common bile duct: Diameter: 4 mm, normal. Liver: No focal lesion identified. Diffusely increased and heterogeneous in parenchymal echogenicity. Portal vein is patent on color Doppler imaging with normal direction of blood flow towards the liver. Other: None. IMPRESSION: 1. Hepatic steatosis.  No evidence of focal lesion. 2. Normal sonographic appearance of the gallbladder and biliary tree. Electronically Signed   By: Keith Rake M.D.   On: 09/29/2018 02:16    PHYSICAL EXAM Pleasant middle-aged Caucasian male not in distress. . Afebrile. Head is nontraumatic. Neck is supple without bruit.    Cardiac exam no murmur or gallop. Lungs are clear to auscultation. Distal pulses are well felt. Neurological Exam ;  Awake  Alert oriented x 3. Normal speech and language.eye movements full without nystagmus.fundi were not visualized.  Partial left homonymous hemianopsia.  Hearing is normal. Palatal movements are normal. Face symmetric. Tongue midline. Normal strength, tone, reflexes and coordination. Normal sensation. Gait deferred. NIH stroke scale 1 Modified Rankin pre morbid 0 ASSESSMENT/PLAN Shaun Ball is a 68 y.o. male with history of HTN presenting with confusion, forgetful, trouble using cell phone.   Stroke:  Scattered right MCA infarcts,  Embolic secondary to unknown source - possible R ICA stenosis  CTA head  streak artifact cannot see R VA. Intracranial atherosclerosis. Mild/mod narrowing R VA, BA and B ICAs. Parenchymal hypodensities R cerebral cortex suspicious for infarcts   CTA neck prox R cervical ICA < 50%  MRI  Moderate acute/subacte R temporal occipital lobe infarct w/ mild petechial hemorrhage. Numerous small R MCA infarcts (frontal, parietal, occipital lobes) and R basal ganglia. Mod small vessel disease.   Carotid Doppler  pending . If neg, will do TEE and loop tomorrow. Have contacted cards to secure  spot if possible.  2D Echo EF 60-65%. No source of embolus   LDL 219  HgbA1c 6.0  SCDs for VTE prophylaxis  No antithrombotic prior to admission, now on aspirin 325 mg daily. Given  mild stroke, recommend aspirin 81 mg and plavix 75 mg daily x 3 weeks, then aspirin alone. Will hold adding plavix until carotid dopplers resulted in the event surgery is considered  Therapy recommendations:  No PT, no OT, OP SLP  Disposition:  Return home w/ OP SLP  Hypertension  Stable . Permissive hypertension (OK if < 220/120) but gradually normalize in 5-7 days . Long-term BP goal normotensive  Hyperlipidemia  Home meds:  No statin  Hx statin intolerance  started on lipitor 40 and zetia 10  LDL 219, goal < 70  Continue statin  at discharge  Other Stroke Risk Factors  Advanced age  Former Cigarette smoker, quit 18 yrs ago  ETOH use, alcohol level No results found for: ETH, advised to drink no more than 2 drink(s) a day  Snores. Possible obstructive sleep apnea. Patient interested in participating in the Sleep Smart trial. [facility] will follow up for possible enrollment. Please contact them at [phone] for any questions.   Other Active Problems  Back pain  Hx bladder cancer  Hospital day # 2  I have personally obtained history,examined this patient, reviewed notes, independently viewed imaging studies, participated in medical decision making and plan of care.ROS completed by me personally and pertinent positives fully documented  I have made any additions or clarifications directly to the above note.  He has presented with embolic right MCA infarct a week ago and CT angiogram shows borderline 50% right carotid stenosis.  Recommend evaluate this further with carotid ultrasound if there is an unstable plaque or stenosis greater than 60% may consider elective carotid revascularization but if ultrasound shows nonsignificant stenosis will recommend TEE and  loop recorder for cardiac source of embolism.  Aspirin and Plavix for 3 weeks followed by Plavix alone.  Patient was advised not to drive till his peripheral vision loss improves.  Patient also appears to be at high risk for sleep apnea and may benefit with possible consideration for participation in the sleep smart trial.  We will give him information to review and decide.  Discussed with patient and Dr. Lonny Prude.  Greater than 50% time during this 35-minute visit was spent on counseling and coordination of care about embolic stroke and carotid stenosis and answering questions   Antony Contras, MD Medical Director [place] Pager: [phone] 09/30/2018 4:19 PM   To contact Stroke Continuity provider, please refer to [email or web address] After hours, contact General Neurology

## 2018-09-30 NOTE — Care Plan (Signed)
Called to bedside by concern for witnessed seizure-like activity by son.  He reports this occurred during carotid ultrasound procedure (while pressing on left side of neck) patient whole body tensed.  Was noted to have 10-second pause on telemetry, confirmed on my review before returning to normal mental status.  Patient had no urinary incontinence/bowel incontinence.  And no postictal state.  Doubt seizure, suspect possible carotid lesion exacerbated during carotid ultrasound.  Elevated patient status from medical to progressive for closer monitoring of hemodynamics.  On physical exam patient is alert and oriented x4, with no new focal deficits.  Denies any chest pain, neck pain.  Currently awaiting results of carotid ultrasound to dictate further steps  Desiree Hane

## 2018-09-30 NOTE — Progress Notes (Signed)
  Speech Language Pathology Treatment: Cognitive-Linquistic  Patient Details Name: Shaun Ball MRN: [id number] DOB: 1950-10-05 Today's Date: 09/30/2018 Time: 4037-5436 SLP Time Calculation (min) (ACUTE ONLY): 30 min  Assessment / Plan / Recommendation Clinical Impression  Pt was seen for cognitive-linguistic treatment and was cooperative thorughout the session. He stated that he slept well last night, no longer has a headache, and feels that his cognition is improved compared to yesterday. He was able to recall events from the day and the plan of care which was outlined to him by the neurologist earlier today. He demonstrated 60% accuracy with recall of concrete information from voicemails increasing to 80% with min-mod cues. He completed a 3-word opposite mental manipulation task with 40% accuracy increasing to 100% with mod cues but was able to complete this task with 2 words with 100% accuracy. He demonstrated 80% accuracy with an executive function calendar activity increasing to 100% with min cues. SLP will continue to follow pt.     HPI HPI: Pt is a 68 y.o. male, with PMH significant for hypertension, hyperlipidemia,, abnormal liver function, bladder cancer, who presented with confusion since 09/24/18 and possible change in vision. MRI of the brain showed moderate-sized acute/subacute infarct within the right temporal occipital lobe with associated mild petechial hemorrhage. Numerous additional small right MCA vascular territory acute/subacute infarcts involving the right frontal, parietal and occipital lobes. Multiple acute/subacute infarcts also present within the right basal ganglia.      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 Hardin Negus, [place], [place] Office number [phone] Pager [phone]                 Shaun Ball 09/30/2018, 12:50 PM

## 2018-09-30 NOTE — Progress Notes (Signed)
Bilateral carotid duplex completed. Preliminary results in Chart Review CV proc. [place] Suzetta Timko,RVS 09/30/2018 7:19 PM

## 2018-09-30 NOTE — TOC Initial Note (Signed)
Transition of Care [facility]) - Initial/Assessment Note    Patient Details  Name: Shaun Ball MRN: [id number] Date of Birth: November 08, 1950  Transition of Care [facility]) CM/SW Contact:    Pollie Friar, RN Phone Number: 09/30/2018, 4:19 PM  Clinical Narrative:                 ST is recommending outpatient therapy for ST. CM met with the patient and his son and currently the patient is refusing.  TOC will follow for further d/c needs.   Expected Discharge Plan: OP Rehab Barriers to Discharge: Continued Medical Work up   Patient Goals and CMS Choice     Choice offered to / list presented to : Patient  Expected Discharge Plan and Services Expected Discharge Plan: OP Rehab   Discharge Planning Services: CM Consult                                          Prior Living Arrangements/Services   Lives with:: Adult Children(son) Patient language and need for interpreter reviewed:: Yes(no needs) Do you feel safe going back to the place where you live?: Yes      Need for Family Participation in Patient Care: Yes (Comment)(intermittent supervision) Care giver support system in place?: Yes (comment)(has intermittent supervision at home per family)   Criminal Activity/Legal Involvement Pertinent to Current Situation/Hospitalization: No - Comment as needed  Activities of Daily Living Home Assistive Devices/Equipment: Eyeglasses ADL Screening (condition at time of admission) Patient's cognitive ability adequate to safely complete daily activities?: Yes Is the patient deaf or have difficulty hearing?: No Does the patient have difficulty seeing, even when wearing glasses/contacts?: No Does the patient have difficulty concentrating, remembering, or making decisions?: Yes Patient able to express need for assistance with ADLs?: Yes Does the patient have difficulty dressing or bathing?: No Independently performs ADLs?: Yes (appropriate for developmental age) Does the patient have  difficulty walking or climbing stairs?: No Weakness of Legs: None Weakness of Arms/Hands: None  Permission Sought/Granted                  Emotional Assessment Appearance:: Appears stated age Attitude/Demeanor/Rapport: Engaged Affect (typically observed): Accepting Orientation: : Oriented to Self, Oriented to  Time, Oriented to Situation, Oriented to Place   Psych Involvement: No (comment)  Admission diagnosis:  Confusion [R41.0] Abnormal liver function [R94.5] Spinal stenosis of lumbar region without neurogenic claudication [M48.061] Cerebrovascular accident (CVA), unspecified mechanism ([place]) [I63.9] Patient Active Problem List   Diagnosis Date Noted  . Essential hypertension 09/29/2018  . Abnormal liver function 09/29/2018  . 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 [place]), [place] - [address] [place] ([place]) [place] [address] Phone: [phone] Fax: [phone]     Social Determinants of Health (SDOH) Interventions    Readmission Risk Interventions No flowsheet data found.

## 2018-09-30 NOTE — Progress Notes (Addendum)
STROKE TEAM PROGRESS NOTE   INTERVAL HISTORY Patient presented with 1 week history of acute confusion and disorientation likely from embolic right MCA infarct.  CT angiogram showed 50% proximal right carotid stenosis which could be the source but I am not certain.  2D echo is unremarkable.  May need to check carotid ultrasound and if there is unstable plaque or greater than 60% stenosis may consider carotid revascularization but if ultrasound shows even less significant stenosis may need to consider TEE and loop recorder to look for further cardiac source of embolism  Vitals:   09/30/18 0326 09/30/18 0600 09/30/18 0831 09/30/18 1210  BP: 123/72  135/74 126/71  Pulse: 62  68 68  Resp: 17     Temp: 97.8 F (36.6 C)  97.9 F (36.6 C) 97.7 F (36.5 C)  TempSrc:   Oral Oral  SpO2: 96%  98% 97%  Weight:  84 kg    Height:  5\' 10"  (1.778 m)      CBC:  Recent Labs  Lab 09/28/18 1818 09/29/18 0533  WBC 10.1 9.7  HGB 16.8 15.7  HCT 50.8 47.4  MCV 91.9 91.9  PLT 192 416    Basic Metabolic Panel:  Recent Labs  Lab 09/29/18 0533 09/30/18 1004  NA 136 138  K 4.2 4.0  CL 106 105  CO2 26 25  GLUCOSE 105* 167*  BUN 13 11  CREATININE 1.05 1.14  CALCIUM 8.8* 8.8*   Lipid Panel:     Component Value Date/Time   CHOL 289 (H) 09/29/2018 0533   TRIG 181 (H) 09/29/2018 0533   HDL 34 (L) 09/29/2018 0533   CHOLHDL 8.5 09/29/2018 0533   VLDL 36 09/29/2018 0533   LDLCALC 219 (H) 09/29/2018 0533   HgbA1c:  Lab Results  Component Value Date   HGBA1C 6.0 (H) 09/29/2018   Urine Drug Screen: No results found for: LABOPIA, COCAINSCRNUR, LABBENZ, AMPHETMU, THCU, LABBARB  Alcohol Level No results found for: ETH  IMAGING Ct Angio Head W Or Wo Contrast  Result Date: 09/29/2018 CLINICAL DATA:  Stroke, follow-up. Additional history: Confusion for the last 5 days, forgetfulness, difficulty using phone, son concern for visual changes. EXAM: CT ANGIOGRAPHY HEAD AND NECK TECHNIQUE: Multidetector  CT imaging of the head and neck was performed using the standard protocol during bolus administration of intravenous contrast. Multiplanar CT image reconstructions and MIPs were obtained to evaluate the vascular anatomy. Carotid stenosis measurements (when applicable) are obtained utilizing NASCET criteria, using the distal internal carotid diameter as the denominator. CONTRAST:  167mL OMNIPAQUE IOHEXOL 350 MG/ML SOLN COMPARISON:  Head CT 09/28/2018 FINDINGS: CTA NECK FINDINGS Aortic arch: Standard branching. Atherosclerotic calcification within the imaged aortic arch and major branch vessels. No high-grade narrowing of the major branch vessel origins. Right carotid system: Atherosclerotic plaque at the right carotid bifurcation with less than 50% stenosis of the proximal right cervical internal carotid artery. Left carotid system: Atherosclerotic plaque at the left carotid bifurcation without significant stenosis (50% or greater). Calcified plaque also present within the distal cervical left ICA without significant stenosis (50% or greater). Vertebral arteries: Scattered atherosclerotic plaque within the bilateral cervical vertebral arteries without significant stenosis (50% or greater). Skeleton: Cervical spondylosis without high-grade bony spinal canal stenosis. No suspicious lytic or blastic osseous lesions. Other neck: No soft tissue neck mass or pathologically enlarged cervical chain lymph nodes. Upper chest: No confluent consolidation within the imaged lung apices. Review of the MIP images confirms the above findings CTA HEAD FINDINGS Anterior circulation:  Calcified and noncalcified plaque within the bilateral carotid siphons with no more than mild luminal narrowing. The right middle and anterior cerebral arteries are patent without significant proximal stenosis. The left middle and anterior cerebral arteries are patent without significant proximal stenosis. No anterior circulation aneurysm is identified.  Posterior circulation: Scattered atherosclerotic plaque within the intracranial vertebrobasilar system. Streak artifact from dental restoration limits evaluation of the non dominant right vertebral artery at the level of the skull base, and stenosis at the level of the foramen magnum is difficult to exclude. Mild/moderate narrowing of the distal right vertebral artery just proximal to the vertebrobasilar junction. The left vertebral artery is patent without significant stenosis. Atherosclerotic irregularity of the basilar artery with mild luminal narrowing of the proximal basilar artery. The bilateral posterior cerebral arteries are patent without significant proximal stenosis. Posterior communicating arteries are present bilaterally. No posterior circulation aneurysm is identified. Venous sinuses: Within the limitations of contrast timing, no evidence of thrombosis. Anatomic variants: None of significance. Other: Right maxillary sinus mucous retention cyst. Review of the MIP images confirms the above findings IMPRESSION: CTA HEAD: Streak artifact from dental restoration limits evaluation of the distal right vertebral artery at the level of the foramen magnum, and a stenosis at this site cannot be excluded. Intracranial atherosclerotic disease. Mild/moderate narrowing of the non dominant distal right vertebral artery just proximal to the vertebrobasilar junction. Additional sites of mild atherosclerotic luminal narrowing within the basilar and bilateral intracranial internal carotid arteries. Parenchymal hypodensities within the right cerebral cortex suspicious for multifocal subacute infarcts were better appreciated on prior head CT 09/28/2018. Consider brain MRI for further evaluation. No intracranial aneurysm identified. CTA NECK: The common carotid, cervical internal carotid and cervical vertebral arteries are patent within the neck. Atherosclerotic disease results in less than 50% narrowing of the proximal  cervical right ICA. Electronically Signed   By: Kellie Simmering   On: 09/29/2018 09:03   Ct Head Wo Contrast  Result Date: 09/28/2018 CLINICAL DATA:  Altered level of consciousness. EXAM: CT HEAD WITHOUT CONTRAST TECHNIQUE: Contiguous axial images were obtained from the base of the skull through the vertex without intravenous contrast. COMPARISON:  None. FINDINGS: Brain: Moderate region of ill-defined cortical low-density in the right occipital and posterior temporal region, nonspecific but suspicious for subacute ischemia. Small ill-defined cortical low-density in the right frontal lobe. Low-density in the right basal ganglia suspicious for chronic or subacute ischemia. No hemorrhage. No midline shift, hydrocephalus, or subdural/extra-axial collection. Cerebellum is unremarkable. Vascular: No hyperdense vessel, with special attention to the right MCA. Mild atherosclerosis of skull base vasculature. Skull: No fracture or focal lesion. Sinuses/Orbits: Paranasal sinuses and mastoid air cells are clear. The visualized orbits are unremarkable. Other: None. IMPRESSION: 1. Findings suspicious for multifocal subacute ischemia throughout the right cerebral hemisphere. Recommend MRI for further characterization to exclude the possibility of underlying lesions. 2. No hemorrhage. No hyperdense vessel, with special attention to the right MCA. Electronically Signed   By: Keith Rake M.D.   On: 09/28/2018 23:01   Ct Angio Neck W Or Wo Contrast  Result Date: 09/29/2018 CLINICAL DATA:  Stroke, follow-up. Additional history: Confusion for the last 5 days, forgetfulness, difficulty using phone, son concern for visual changes. EXAM: CT ANGIOGRAPHY HEAD AND NECK TECHNIQUE: Multidetector CT imaging of the head and neck was performed using the standard protocol during bolus administration of intravenous contrast. Multiplanar CT image reconstructions and MIPs were obtained to evaluate the vascular anatomy. Carotid stenosis  measurements (when applicable) are  obtained utilizing NASCET criteria, using the distal internal carotid diameter as the denominator. CONTRAST:  168mL OMNIPAQUE IOHEXOL 350 MG/ML SOLN COMPARISON:  Head CT 09/28/2018 FINDINGS: CTA NECK FINDINGS Aortic arch: Standard branching. Atherosclerotic calcification within the imaged aortic arch and major branch vessels. No high-grade narrowing of the major branch vessel origins. Right carotid system: Atherosclerotic plaque at the right carotid bifurcation with less than 50% stenosis of the proximal right cervical internal carotid artery. Left carotid system: Atherosclerotic plaque at the left carotid bifurcation without significant stenosis (50% or greater). Calcified plaque also present within the distal cervical left ICA without significant stenosis (50% or greater). Vertebral arteries: Scattered atherosclerotic plaque within the bilateral cervical vertebral arteries without significant stenosis (50% or greater). Skeleton: Cervical spondylosis without high-grade bony spinal canal stenosis. No suspicious lytic or blastic osseous lesions. Other neck: No soft tissue neck mass or pathologically enlarged cervical chain lymph nodes. Upper chest: No confluent consolidation within the imaged lung apices. Review of the MIP images confirms the above findings CTA HEAD FINDINGS Anterior circulation: Calcified and noncalcified plaque within the bilateral carotid siphons with no more than mild luminal narrowing. The right middle and anterior cerebral arteries are patent without significant proximal stenosis. The left middle and anterior cerebral arteries are patent without significant proximal stenosis. No anterior circulation aneurysm is identified. Posterior circulation: Scattered atherosclerotic plaque within the intracranial vertebrobasilar system. Streak artifact from dental restoration limits evaluation of the non dominant right vertebral artery at the level of the skull base, and  stenosis at the level of the foramen magnum is difficult to exclude. Mild/moderate narrowing of the distal right vertebral artery just proximal to the vertebrobasilar junction. The left vertebral artery is patent without significant stenosis. Atherosclerotic irregularity of the basilar artery with mild luminal narrowing of the proximal basilar artery. The bilateral posterior cerebral arteries are patent without significant proximal stenosis. Posterior communicating arteries are present bilaterally. No posterior circulation aneurysm is identified. Venous sinuses: Within the limitations of contrast timing, no evidence of thrombosis. Anatomic variants: None of significance. Other: Right maxillary sinus mucous retention cyst. Review of the MIP images confirms the above findings IMPRESSION: CTA HEAD: Streak artifact from dental restoration limits evaluation of the distal right vertebral artery at the level of the foramen magnum, and a stenosis at this site cannot be excluded. Intracranial atherosclerotic disease. Mild/moderate narrowing of the non dominant distal right vertebral artery just proximal to the vertebrobasilar junction. Additional sites of mild atherosclerotic luminal narrowing within the basilar and bilateral intracranial internal carotid arteries. Parenchymal hypodensities within the right cerebral cortex suspicious for multifocal subacute infarcts were better appreciated on prior head CT 09/28/2018. Consider brain MRI for further evaluation. No intracranial aneurysm identified. CTA NECK: The common carotid, cervical internal carotid and cervical vertebral arteries are patent within the neck. Atherosclerotic disease results in less than 50% narrowing of the proximal cervical right ICA. Electronically Signed   By: Kellie Simmering   On: 09/29/2018 09:03   Mr Brain Wo Contrast  Addendum Date: 09/29/2018   ADDENDUM REPORT: 09/29/2018 11:12 ADDENDUM: These results were called by telephone at the time of  interpretation on 09/29/2018 at 11:11 am to Dr. Lupita Raider, who verbally acknowledged these results. Electronically Signed   By: Kellie Simmering   On: 09/29/2018 11:12   Result Date: 09/29/2018 CLINICAL DATA:  Focal neuro deficit, greater than 6 hours, stroke suspected. EXAM: MRI HEAD WITHOUT CONTRAST TECHNIQUE: Multiplanar, multiecho pulse sequences of the brain and surrounding structures were obtained without  intravenous contrast. COMPARISON:  CT angiography head/neck performed earlier the same day 09/29/2018, head CT 09/28/2018 FINDINGS: Brain: Multiple sequences are motion degraded. There are several acute/subacute infarcts within the right basal ganglia, the largest within the posterior limb of right internal and posterior lentiform nucleus measuring 1.6 x 0.9 cm. Smaller acute infarcts present within the right caudate nucleus and right external capsule. There are multiple additional small acute/subacute cortical and white matter infarcts within the right frontal lobe, the largest involving the right superior frontal gyrus. Moderate-sized acute/subacute infarct within the right temporal occipital lobe, measuring. 4.2 x 2.6 cm. There are multiple smaller acute/subacute infarcts more posteriorly within the right occipital and parietal lobes. Corresponding T2/FLAIR hyperintensity at these sites. Mild regional mass effect associated with the larger infarcts. No midline shift. Small amount of irregular SWI signal loss associated with the dominant right temporal occipital lobe infarct, likely reflecting petechial hemorrhage. Additional moderate scattered and confluent T2/FLAIR hyperintensity within the bilateral cerebral white matter consistent with chronic small vessel ischemic disease. Small chronic lacunar infarcts within the bilateral thalami. Cerebral volume is age appropriate. Vascular: Flow voids maintained within the proximal large arterial vessels. The distal right vertebral artery is non dominant. Skull  and upper cervical spine: Normal marrow signal. Sinuses/Orbits: The imaged globes and orbits demonstrate no acute abnormality. Mild ethmoid sinus mucosal thickening. Small right maxillary sinus mucous retention cyst. No significant mastoid effusion. IMPRESSION: Moderate-sized acute/subacute infarct within the right temporal occipital lobe with associated mild petechial hemorrhage. Numerous additional small right MCA vascular territory acute/subacute infarcts involving the right frontal, parietal and occipital lobes. Multiple acute/subacute infarcts also present within the right basal ganglia. Moderate chronic small vessel ischemic disease. Electronically Signed: By: Kellie Simmering On: 09/29/2018 11:01   Dg Chest Portable 1 View  Result Date: 09/28/2018 CLINICAL DATA:  Flank pain. EXAM: PORTABLE CHEST 1 VIEW COMPARISON:  None. FINDINGS: Cardiomediastinal silhouette is normal. Mediastinal contours appear intact. There is no evidence of focal airspace consolidation, pleural effusion or pneumothorax. Osseous structures are without acute abnormality. Soft tissues are grossly normal. IMPRESSION: No active disease. Electronically Signed   By: Fidela Salisbury M.D.   On: 09/28/2018 20:15   Ct Renal Stone Study  Result Date: 09/28/2018 CLINICAL DATA:  Flank pain. EXAM: CT ABDOMEN AND PELVIS WITHOUT CONTRAST TECHNIQUE: Multidetector CT imaging of the abdomen and pelvis was performed following the standard protocol without IV contrast. COMPARISON:  CT abdomen dated 10/01/2016. FINDINGS: Lower chest: No acute abnormality. Hepatobiliary: Liver is diffusely low in density indicating fatty infiltration. Gallbladder is unremarkable, partially contracted. No bile duct dilatation. Pancreas: Unremarkable. No pancreatic ductal dilatation or surrounding inflammatory changes. Spleen: Normal in size without focal abnormality. Adrenals/Urinary Tract: Adrenal glands appear normal. RIGHT renal cyst. Indeterminate mass exophytic to  the lower pole of the LEFT kidney is stable for 2 years suggesting benignity, considered benign per MRI report of 04/24/2017. Kidneys otherwise unremarkable without suspicious mass, stone or hydronephrosis. No ureteral or bladder calculi identified. Bladder appears normal. Stomach/Bowel: No dilated large or small bowel loops. No evidence of bowel wall inflammation. Appendix appears normal. Stomach is unremarkable, partially decompressed. Vascular/Lymphatic: Aortic atherosclerosis. No enlarged lymph nodes seen in the abdomen or pelvis. Reproductive: Prostate gland is upper normal in size. Other: No free fluid or abscess collection. No free intraperitoneal air. Musculoskeletal: No acute or suspicious osseous finding. Degenerative spondylosis of the thoracolumbar spine, mild to moderate in degree. Bilateral inguinal hernias, LEFT greater than RIGHT, containing fat only. IMPRESSION: 1. No acute findings  within the abdomen or pelvis. No renal or ureteral calculi. No bowel obstruction or evidence of bowel wall inflammation. No evidence of acute solid organ abnormality. No free fluid or inflammatory change. Appendix is normal. 2. Fatty infiltration of the liver. 3. Degenerative spondylosis of the thoracolumbar spine, most prominent at the L3-4 and L4-5 levels where there are moderate to severe central canal stenoses secondary to a combination of disc bulge and degenerative facet hypertrophy. This is a potential source for patient's flank pain. Consider nonemergent lumbar spine MRI for further characterization. 4. Additional chronic/incidental findings detailed above. Aortic Atherosclerosis (ICD10-I70.0). Electronically Signed   By: Franki Cabot M.D.   On: 09/28/2018 20:25   US Abdomen Limited Ruq  Result Date: 09/29/2018 CLINICAL DATA:  Abnormal LFTs. EXAM: ULTRASOUND ABDOMEN LIMITED RIGHT UPPER QUADRANT COMPARISON:  Noncontrast CT yesterday. FINDINGS: Gallbladder: Physiologically distended. No gallstones or wall  thickening visualized. No sonographic Murphy sign noted by sonographer. Common bile duct: Diameter: 4 mm, normal. Liver: No focal lesion identified. Diffusely increased and heterogeneous in parenchymal echogenicity. Portal vein is patent on color Doppler imaging with normal direction of blood flow towards the liver. Other: None. IMPRESSION: 1. Hepatic steatosis.  No evidence of focal lesion. 2. Normal sonographic appearance of the gallbladder and biliary tree. Electronically Signed   By: Keith Rake M.D.   On: 09/29/2018 02:16    PHYSICAL EXAM Pleasant middle-aged Caucasian male not in distress. . Afebrile. Head is nontraumatic. Neck is supple without bruit.    Cardiac exam no murmur or gallop. Lungs are clear to auscultation. Distal pulses are well felt. Neurological Exam ;  Awake  Alert oriented x 3. Normal speech and language.eye movements full without nystagmus.fundi were not visualized.  Partial left homonymous hemianopsia.  Hearing is normal. Palatal movements are normal. Face symmetric. Tongue midline. Normal strength, tone, reflexes and coordination. Normal sensation. Gait deferred. NIH stroke scale 1 Modified Rankin pre morbid 0 ASSESSMENT/PLAN Shaun Ball is a 68 y.o. male with history of HTN presenting with confusion, forgetful, trouble using cell phone.   Stroke:  Scattered right MCA infarcts,  Embolic secondary to unknown source - possible R ICA stenosis  CTA head  streak artifact cannot see R VA. Intracranial atherosclerosis. Mild/mod narrowing R VA, BA and B ICAs. Parenchymal hypodensities R cerebral cortex suspicious for infarcts   CTA neck prox R cervical ICA < 50%  MRI  Moderate acute/subacte R temporal occipital lobe infarct w/ mild petechial hemorrhage. Numerous small R MCA infarcts (frontal, parietal, occipital lobes) and R basal ganglia. Mod small vessel disease.   Carotid Doppler  pending . If neg, will do TEE and loop tomorrow. Have contacted cards to secure  spot if possible.  2D Echo EF 60-65%. No source of embolus   LDL 219  HgbA1c 6.0  SCDs for VTE prophylaxis  No antithrombotic prior to admission, now on aspirin 325 mg daily. Given  mild stroke, recommend aspirin 81 mg and plavix 75 mg daily x 3 weeks, then aspirin alone. Will hold adding plavix until carotid dopplers resulted in the event surgery is considered  Therapy recommendations:  No PT, no OT, OP SLP  Disposition:  Return home w/ OP SLP  Hypertension  Stable . Permissive hypertension (OK if < 220/120) but gradually normalize in 5-7 days . Long-term BP goal normotensive  Hyperlipidemia  Home meds:  No statin  Hx statin intolerance  started on lipitor 40 and zetia 10  LDL 219, goal < 70  Continue statin  at discharge  Other Stroke Risk Factors  Advanced age  Former Cigarette smoker, quit 18 yrs ago  ETOH use, alcohol level No results found for: ETH, advised to drink no more than 2 drink(s) a day  Snores. Possible obstructive sleep apnea. Patient interested in participating in the Sleep Smart trial. [facility] will follow up for possible enrollment. Please contact them at [phone] for any questions.   Other Active Problems  Back pain  Hx bladder cancer  Hospital day # 2  I have personally obtained history,examined this patient, reviewed notes, independently viewed imaging studies, participated in medical decision making and plan of care.ROS completed by me personally and pertinent positives fully documented  I have made any additions or clarifications directly to the above note.  He has presented with embolic right MCA infarct a week ago and CT angiogram shows borderline 50% right carotid stenosis.  Recommend evaluate this further with carotid ultrasound if there is an unstable plaque or stenosis greater than 60% may consider elective carotid revascularization but if ultrasound shows nonsignificant stenosis will recommend TEE and  loop recorder for cardiac source of embolism.  Aspirin and Plavix for 3 weeks followed by Plavix alone.  Patient was advised not to drive till his peripheral vision loss improves.  Patient also appears to be at high risk for sleep apnea and may benefit with possible consideration for participation in the sleep smart trial.  We will give him information to review and decide.  Discussed with patient and Dr. Lonny Prude.  Greater than 50% time during this 35-minute visit was spent on counseling and coordination of care about embolic stroke and carotid stenosis and answering questions   Shaun Contras, MD Medical Director [place] Pager: [phone] 09/30/2018 4:19 PM   To contact Stroke Continuity provider, please refer to [email or web address] After hours, contact General Neurology

## 2018-09-30 NOTE — Progress Notes (Signed)
Nurse called to bedside for family witnessed seizure. Family said it lasted around 10 seconds and entire body tensed up and arms started to shake. When nurse came to bedside, pt A&O x4 and states no pain. Patient only remembers feeling hot before episode happened. Tele also reported a 11.17 pause. Dr. Lonny Prude paged and coming to bedside. Seizure precautions implemented. Nurse will continue to monitor. [place]

## 2018-09-30 NOTE — Progress Notes (Signed)
   Shaun Ball has been requested to perform a transesophageal echocardiogram on Shaun Ball for acute stroke.  After careful review of history and examination, the risks and benefits of transesophageal echocardiogram have been explained including risks of esophageal damage, perforation (1:10,000 risk), bleeding, pharyngeal hematoma as well as other potential complications associated with conscious sedation including aspiration, arrhythmia, respiratory failure and death. Alternatives to treatment were discussed, questions were answered. Patient is willing to proceed.   Procedure is scheduled for 10/01/2018 at 14:30 with Dr. Harrington Challenger.  Darreld Mclean, PA-C 09/30/2018 4:53 PM

## 2018-09-30 NOTE — Progress Notes (Signed)
[facility]  PROGRESS NOTE  FRANS VALENTE OVZ:[id number] DOB: [birth year] DOA: 09/28/2018 PCP: Reynold Bowen, MD  Brief History    Shaun Ball is a 68 y.o. year old male with medical history significant for bladder cancer who presented on 09/28/2018 with right flank pain and back pain and confusion that started several days before admission and was found to have multifocal subacute ischemia throughout the right cerebral hemisphere. ransferred from [place] [facility] to [facility] for MRI brain  A & P   Subacute right cerebral stroke, multifocal hypodensities on CT head concerning for multifocal subacute infarcts which confirmed scattered right MCA infarcts.  Concern for embolic source, CTA neck shows right cervical ICA less than 50%, pending carotid Doppler, if negative will need TEE and loop tomorrow, neurology aware, currently holding Plavix in case surgery needed for potential carotid intervention, continue low-dose aspirin per neurology recommendations.  On discharge patient will go home with speech therapy.     Hyperlipidemia.  LDL 219.  Start Lipitor 40 mg along with Zetia.  Patient reports previous intolerance of Crestor ("whelps") in the past doing well on atorvastatin here.,  Monitoring LFTs   Back pain, likely due to spinal stenosis based off CT abdomen obtained to rule out renal stone.  No neurologic deficits here, will benefit from continued outpatient work-up on discharge with PCP, discussed with patient and his son.   Hypertension, blood pressure in the last 24 hours doing well ranging 119/89-144/74.  Will allow permissive hypertension given stroke, hold home lisinopril   Hepatic steatosis.  Right upper quadrant ultrasound obtained for evaluation of ALT on admission, hepatitis panel unremarkable   BPH, stable.  Continue Flomax     DVT prophylaxis: SCDs Code Status: Full code Family Communication: No family at bedside, updated son on 8/19 at  bedside Disposition Plan: Pending carotid ultrasound, n.p.o. midnight for possible TEE pending carotid ultrasound results     [facility] Direct contact: see [email or web address] (further directions at bottom of note if needed) 7PM-7AM contact night coverage as at bottom of note 09/30/2018, 8:23 AM  LOS: 2 days   Consultants   Neurology  Procedures     Antibiotics   None  Interval History/Subjective  Has no acute complaints.  Denies any chest pain, shortness of breath.  Objective   Vitals:  Vitals:   09/29/18 2335 09/30/18 0326  BP: 119/89 123/72  Pulse: 66 62  Resp: 16 17  Temp: (!) 97.5 F (36.4 C) 97.8 F (36.6 C)  SpO2: 96% 96%    Exam:  Awake Alert, Oriented X 3, No new F.N deficits, Normal affect [place].AT,PERRAL Supple Neck,No JVD, No cervical lymphadenopathy appriciated.  Symmetrical Chest wall movement, Good air movement bilaterally, CTAB RRR,No Gallops,Rubs or new Murmurs, No Parasternal Heave +ve B.Sounds, Abd Soft, No tenderness, No organomegaly appriciated, No rebound - guarding or rigidity. No Cyanosis, Clubbing or edema, No new Rash or bruise   I have personally reviewed the following:   Data Reviewed: Basic Metabolic Panel: Recent Labs  Lab 09/28/18 1818 09/29/18 0533  NA 135 136  K 4.4 4.2  CL 101 106  CO2 23 26  GLUCOSE 110* 105*  BUN 14 13  CREATININE 1.09 1.05  CALCIUM 9.6 8.8*   Liver Function Tests: Recent Labs  Lab 09/28/18 1818 09/29/18 0533  AST 38 33  ALT 69* 58*  ALKPHOS 61 56  BILITOT 0.9 1.0  PROT 8.1 7.2  ALBUMIN 4.6 3.9   Recent Labs  Lab 09/28/18 1818  LIPASE 34   No results for input(s): AMMONIA in the last 168 hours. CBC: Recent Labs  Lab 09/28/18 1818 09/29/18 0533  WBC 10.1 9.7  HGB 16.8 15.7  HCT 50.8 47.4  MCV 91.9 91.9  PLT 192 169   Cardiac Enzymes: Recent Labs  Lab 09/29/18 0533  CKTOTAL 52  CKMB 0.9   BNP (last 3 results) No results for input(s): BNP in the last 8760  hours.  ProBNP (last 3 results) No results for input(s): PROBNP in the last 8760 hours.  CBG: No results for input(s): GLUCAP in the last 168 hours.  Recent Results (from the past 240 hour(s))  Urine Culture     Status: None   Collection Time: 09/28/18  6:15 PM   Specimen: Urine, Random  Result Value Ref Range Status   Specimen Description   Final    URINE, RANDOM Performed at [facility] [address] [place], [place] [address]    Special Requests   Final    NONE Performed at [facility], [place] [address], [place], [place] [address]    Culture   Final    NO GROWTH Performed at [facility], [place] [address], [place], [place] [address]    Report Status 09/30/2018 FINAL  Final  SARS Coronavirus 2 [facility] order, Performed in [facility] hospital lab) Nasopharyngeal Nasopharyngeal Swab     Status: None   Collection Time: 09/28/18 11:16 PM   Specimen: Nasopharyngeal Swab  Result Value Ref Range Status   SARS Coronavirus 2 NEGATIVE NEGATIVE Final    Comment: (NOTE) If result is NEGATIVE SARS-CoV-2 target nucleic acids are NOT DETECTED. The SARS-CoV-2 RNA is generally detectable in upper and lower  respiratory specimens during the acute phase of infection. The lowest  concentration of SARS-CoV-2 viral copies this assay can detect is 250  copies / mL. A negative result does not preclude SARS-CoV-2 infection  and should not be used as the sole basis for treatment or other  patient management decisions.  A negative result may occur with  improper specimen collection / handling, submission of specimen other  than nasopharyngeal swab, presence of viral mutation(s) within the  areas targeted by this assay, and inadequate number of viral copies  (<250 copies / mL). A negative result must be combined with clinical  observations, patient history, and epidemiological information. If result is POSITIVE SARS-CoV-2 target nucleic acids are  DETECTED. The SARS-CoV-2 RNA is generally detectable in upper and lower  respiratory specimens dur ing the acute phase of infection.  Positive  results are indicative of active infection with SARS-CoV-2.  Clinical  correlation with patient history and other diagnostic information is  necessary to determine patient infection status.  Positive results do  not rule out bacterial infection or co-infection with other viruses. If result is PRESUMPTIVE POSTIVE SARS-CoV-2 nucleic acids MAY BE PRESENT.   A presumptive positive result was obtained on the submitted specimen  and confirmed on repeat testing.  While 2019 novel coronavirus  (SARS-CoV-2) nucleic acids may be present in the submitted sample  additional confirmatory testing may be necessary for epidemiological  and / or clinical management purposes  to differentiate between  SARS-CoV-2 and other Sarbecovirus currently known to infect humans.  If clinically indicated additional testing with an alternate test  methodology [phone]) is advised. The SARS-CoV-2 RNA is generally  detectable in upper and lower respiratory sp ecimens during the acute  phase of infection. The expected result is Negative.  Fact Sheet for Patients:  StrictlyIdeas.no Fact Sheet for Healthcare Providers: BankingDealers.co.za This test is not yet approved or cleared by the [place] FDA and has been authorized for detection and/or diagnosis of SARS-CoV-2 by FDA under an Emergency Use Authorization (EUA).  This EUA will remain in effect (meaning this test can be used) for the duration of the COVID-19 declaration under Section 564(b)(1) of the Act, 21 U.S.C. section 360bbb-3(b)(1), unless the authorization is terminated or revoked sooner. Performed at [facility], [place] [address], [place], [place] [address]      Studies: Ct Angio Head W Or Wo Contrast  Result Date: 09/29/2018 CLINICAL DATA:   Stroke, follow-up. Additional history: Confusion for the last 5 days, forgetfulness, difficulty using phone, son concern for visual changes. EXAM: CT ANGIOGRAPHY HEAD AND NECK TECHNIQUE: Multidetector CT imaging of the head and neck was performed using the standard protocol during bolus administration of intravenous contrast. Multiplanar CT image reconstructions and MIPs were obtained to evaluate the vascular anatomy. Carotid stenosis measurements (when applicable) are obtained utilizing NASCET criteria, using the distal internal carotid diameter as the denominator. CONTRAST:  165mL OMNIPAQUE IOHEXOL 350 MG/ML SOLN COMPARISON:  Head CT 09/28/2018 FINDINGS: CTA NECK FINDINGS Aortic arch: Standard branching. Atherosclerotic calcification within the imaged aortic arch and major branch vessels. No high-grade narrowing of the major branch vessel origins. Right carotid system: Atherosclerotic plaque at the right carotid bifurcation with less than 50% stenosis of the proximal right cervical internal carotid artery. Left carotid system: Atherosclerotic plaque at the left carotid bifurcation without significant stenosis (50% or greater). Calcified plaque also present within the distal cervical left ICA without significant stenosis (50% or greater). Vertebral arteries: Scattered atherosclerotic plaque within the bilateral cervical vertebral arteries without significant stenosis (50% or greater). Skeleton: Cervical spondylosis without high-grade bony spinal canal stenosis. No suspicious lytic or blastic osseous lesions. Other neck: No soft tissue neck mass or pathologically enlarged cervical chain lymph nodes. Upper chest: No confluent consolidation within the imaged lung apices. Review of the MIP images confirms the above findings CTA HEAD FINDINGS Anterior circulation: Calcified and noncalcified plaque within the bilateral carotid siphons with no more than mild luminal narrowing. The right middle and anterior cerebral  arteries are patent without significant proximal stenosis. The left middle and anterior cerebral arteries are patent without significant proximal stenosis. No anterior circulation aneurysm is identified. Posterior circulation: Scattered atherosclerotic plaque within the intracranial vertebrobasilar system. Streak artifact from dental restoration limits evaluation of the non dominant right vertebral artery at the level of the skull base, and stenosis at the level of the foramen magnum is difficult to exclude. Mild/moderate narrowing of the distal right vertebral artery just proximal to the vertebrobasilar junction. The left vertebral artery is patent without significant stenosis. Atherosclerotic irregularity of the basilar artery with mild luminal narrowing of the proximal basilar artery. The bilateral posterior cerebral arteries are patent without significant proximal stenosis. Posterior communicating arteries are present bilaterally. No posterior circulation aneurysm is identified. Venous sinuses: Within the limitations of contrast timing, no evidence of thrombosis. Anatomic variants: None of significance. Other: Right maxillary sinus mucous retention cyst. Review of the MIP images confirms the above findings IMPRESSION: CTA HEAD: Streak artifact from dental restoration limits evaluation of the distal right vertebral artery at the level of the foramen magnum, and a stenosis at this site cannot be excluded. Intracranial atherosclerotic disease. Mild/moderate narrowing of the non dominant distal right vertebral artery just proximal to the vertebrobasilar junction. Additional sites  of mild atherosclerotic luminal narrowing within the basilar and bilateral intracranial internal carotid arteries. Parenchymal hypodensities within the right cerebral cortex suspicious for multifocal subacute infarcts were better appreciated on prior head CT 09/28/2018. Consider brain MRI for further evaluation. No intracranial aneurysm  identified. CTA NECK: The common carotid, cervical internal carotid and cervical vertebral arteries are patent within the neck. Atherosclerotic disease results in less than 50% narrowing of the proximal cervical right ICA. Electronically Signed   By: Kellie Simmering   On: 09/29/2018 09:03   Ct Head Wo Contrast  Result Date: 09/28/2018 CLINICAL DATA:  Altered level of consciousness. EXAM: CT HEAD WITHOUT CONTRAST TECHNIQUE: Contiguous axial images were obtained from the base of the skull through the vertex without intravenous contrast. COMPARISON:  None. FINDINGS: Brain: Moderate region of ill-defined cortical low-density in the right occipital and posterior temporal region, nonspecific but suspicious for subacute ischemia. Small ill-defined cortical low-density in the right frontal lobe. Low-density in the right basal ganglia suspicious for chronic or subacute ischemia. No hemorrhage. No midline shift, hydrocephalus, or subdural/extra-axial collection. Cerebellum is unremarkable. Vascular: No hyperdense vessel, with special attention to the right MCA. Mild atherosclerosis of skull base vasculature. Skull: No fracture or focal lesion. Sinuses/Orbits: Paranasal sinuses and mastoid air cells are clear. The visualized orbits are unremarkable. Other: None. IMPRESSION: 1. Findings suspicious for multifocal subacute ischemia throughout the right cerebral hemisphere. Recommend MRI for further characterization to exclude the possibility of underlying lesions. 2. No hemorrhage. No hyperdense vessel, with special attention to the right MCA. Electronically Signed   By: Keith Rake M.D.   On: 09/28/2018 23:01   Ct Angio Neck W Or Wo Contrast  Result Date: 09/29/2018 CLINICAL DATA:  Stroke, follow-up. Additional history: Confusion for the last 5 days, forgetfulness, difficulty using phone, son concern for visual changes. EXAM: CT ANGIOGRAPHY HEAD AND NECK TECHNIQUE: Multidetector CT imaging of the head and neck was  performed using the standard protocol during bolus administration of intravenous contrast. Multiplanar CT image reconstructions and MIPs were obtained to evaluate the vascular anatomy. Carotid stenosis measurements (when applicable) are obtained utilizing NASCET criteria, using the distal internal carotid diameter as the denominator. CONTRAST:  173mL OMNIPAQUE IOHEXOL 350 MG/ML SOLN COMPARISON:  Head CT 09/28/2018 FINDINGS: CTA NECK FINDINGS Aortic arch: Standard branching. Atherosclerotic calcification within the imaged aortic arch and major branch vessels. No high-grade narrowing of the major branch vessel origins. Right carotid system: Atherosclerotic plaque at the right carotid bifurcation with less than 50% stenosis of the proximal right cervical internal carotid artery. Left carotid system: Atherosclerotic plaque at the left carotid bifurcation without significant stenosis (50% or greater). Calcified plaque also present within the distal cervical left ICA without significant stenosis (50% or greater). Vertebral arteries: Scattered atherosclerotic plaque within the bilateral cervical vertebral arteries without significant stenosis (50% or greater). Skeleton: Cervical spondylosis without high-grade bony spinal canal stenosis. No suspicious lytic or blastic osseous lesions. Other neck: No soft tissue neck mass or pathologically enlarged cervical chain lymph nodes. Upper chest: No confluent consolidation within the imaged lung apices. Review of the MIP images confirms the above findings CTA HEAD FINDINGS Anterior circulation: Calcified and noncalcified plaque within the bilateral carotid siphons with no more than mild luminal narrowing. The right middle and anterior cerebral arteries are patent without significant proximal stenosis. The left middle and anterior cerebral arteries are patent without significant proximal stenosis. No anterior circulation aneurysm is identified. Posterior circulation: Scattered  atherosclerotic plaque within the intracranial vertebrobasilar system. Streak  artifact from dental restoration limits evaluation of the non dominant right vertebral artery at the level of the skull base, and stenosis at the level of the foramen magnum is difficult to exclude. Mild/moderate narrowing of the distal right vertebral artery just proximal to the vertebrobasilar junction. The left vertebral artery is patent without significant stenosis. Atherosclerotic irregularity of the basilar artery with mild luminal narrowing of the proximal basilar artery. The bilateral posterior cerebral arteries are patent without significant proximal stenosis. Posterior communicating arteries are present bilaterally. No posterior circulation aneurysm is identified. Venous sinuses: Within the limitations of contrast timing, no evidence of thrombosis. Anatomic variants: None of significance. Other: Right maxillary sinus mucous retention cyst. Review of the MIP images confirms the above findings IMPRESSION: CTA HEAD: Streak artifact from dental restoration limits evaluation of the distal right vertebral artery at the level of the foramen magnum, and a stenosis at this site cannot be excluded. Intracranial atherosclerotic disease. Mild/moderate narrowing of the non dominant distal right vertebral artery just proximal to the vertebrobasilar junction. Additional sites of mild atherosclerotic luminal narrowing within the basilar and bilateral intracranial internal carotid arteries. Parenchymal hypodensities within the right cerebral cortex suspicious for multifocal subacute infarcts were better appreciated on prior head CT 09/28/2018. Consider brain MRI for further evaluation. No intracranial aneurysm identified. CTA NECK: The common carotid, cervical internal carotid and cervical vertebral arteries are patent within the neck. Atherosclerotic disease results in less than 50% narrowing of the proximal cervical right ICA. Electronically  Signed   By: Kellie Simmering   On: 09/29/2018 09:03   Mr Brain Wo Contrast  Addendum Date: 09/29/2018   ADDENDUM REPORT: 09/29/2018 11:12 ADDENDUM: These results were called by telephone at the time of interpretation on 09/29/2018 at 11:11 am to Dr. Lupita Raider, who verbally acknowledged these results. Electronically Signed   By: Kellie Simmering   On: 09/29/2018 11:12   Result Date: 09/29/2018 CLINICAL DATA:  Focal neuro deficit, greater than 6 hours, stroke suspected. EXAM: MRI HEAD WITHOUT CONTRAST TECHNIQUE: Multiplanar, multiecho pulse sequences of the brain and surrounding structures were obtained without intravenous contrast. COMPARISON:  CT angiography head/neck performed earlier the same day 09/29/2018, head CT 09/28/2018 FINDINGS: Brain: Multiple sequences are motion degraded. There are several acute/subacute infarcts within the right basal ganglia, the largest within the posterior limb of right internal and posterior lentiform nucleus measuring 1.6 x 0.9 cm. Smaller acute infarcts present within the right caudate nucleus and right external capsule. There are multiple additional small acute/subacute cortical and white matter infarcts within the right frontal lobe, the largest involving the right superior frontal gyrus. Moderate-sized acute/subacute infarct within the right temporal occipital lobe, measuring. 4.2 x 2.6 cm. There are multiple smaller acute/subacute infarcts more posteriorly within the right occipital and parietal lobes. Corresponding T2/FLAIR hyperintensity at these sites. Mild regional mass effect associated with the larger infarcts. No midline shift. Small amount of irregular SWI signal loss associated with the dominant right temporal occipital lobe infarct, likely reflecting petechial hemorrhage. Additional moderate scattered and confluent T2/FLAIR hyperintensity within the bilateral cerebral white matter consistent with chronic small vessel ischemic disease. Small chronic lacunar  infarcts within the bilateral thalami. Cerebral volume is age appropriate. Vascular: Flow voids maintained within the proximal large arterial vessels. The distal right vertebral artery is non dominant. Skull and upper cervical spine: Normal marrow signal. Sinuses/Orbits: The imaged globes and orbits demonstrate no acute abnormality. Mild ethmoid sinus mucosal thickening. Small right maxillary sinus mucous retention cyst. No significant mastoid  effusion. IMPRESSION: Moderate-sized acute/subacute infarct within the right temporal occipital lobe with associated mild petechial hemorrhage. Numerous additional small right MCA vascular territory acute/subacute infarcts involving the right frontal, parietal and occipital lobes. Multiple acute/subacute infarcts also present within the right basal ganglia. Moderate chronic small vessel ischemic disease. Electronically Signed: By: Kellie Simmering On: 09/29/2018 11:01   Dg Chest Portable 1 View  Result Date: 09/28/2018 CLINICAL DATA:  Flank pain. EXAM: PORTABLE CHEST 1 VIEW COMPARISON:  None. FINDINGS: Cardiomediastinal silhouette is normal. Mediastinal contours appear intact. There is no evidence of focal airspace consolidation, pleural effusion or pneumothorax. Osseous structures are without acute abnormality. Soft tissues are grossly normal. IMPRESSION: No active disease. Electronically Signed   By: Fidela Salisbury M.D.   On: 09/28/2018 20:15   Ct Renal Stone Study  Result Date: 09/28/2018 CLINICAL DATA:  Flank pain. EXAM: CT ABDOMEN AND PELVIS WITHOUT CONTRAST TECHNIQUE: Multidetector CT imaging of the abdomen and pelvis was performed following the standard protocol without IV contrast. COMPARISON:  CT abdomen dated 10/01/2016. FINDINGS: Lower chest: No acute abnormality. Hepatobiliary: Liver is diffusely low in density indicating fatty infiltration. Gallbladder is unremarkable, partially contracted. No bile duct dilatation. Pancreas: Unremarkable. No pancreatic  ductal dilatation or surrounding inflammatory changes. Spleen: Normal in size without focal abnormality. Adrenals/Urinary Tract: Adrenal glands appear normal. RIGHT renal cyst. Indeterminate mass exophytic to the lower pole of the LEFT kidney is stable for 2 years suggesting benignity, considered benign per MRI report of 04/24/2017. Kidneys otherwise unremarkable without suspicious mass, stone or hydronephrosis. No ureteral or bladder calculi identified. Bladder appears normal. Stomach/Bowel: No dilated large or small bowel loops. No evidence of bowel wall inflammation. Appendix appears normal. Stomach is unremarkable, partially decompressed. Vascular/Lymphatic: Aortic atherosclerosis. No enlarged lymph nodes seen in the abdomen or pelvis. Reproductive: Prostate gland is upper normal in size. Other: No free fluid or abscess collection. No free intraperitoneal air. Musculoskeletal: No acute or suspicious osseous finding. Degenerative spondylosis of the thoracolumbar spine, mild to moderate in degree. Bilateral inguinal hernias, LEFT greater than RIGHT, containing fat only. IMPRESSION: 1. No acute findings within the abdomen or pelvis. No renal or ureteral calculi. No bowel obstruction or evidence of bowel wall inflammation. No evidence of acute solid organ abnormality. No free fluid or inflammatory change. Appendix is normal. 2. Fatty infiltration of the liver. 3. Degenerative spondylosis of the thoracolumbar spine, most prominent at the L3-4 and L4-5 levels where there are moderate to severe central canal stenoses secondary to a combination of disc bulge and degenerative facet hypertrophy. This is a potential source for patient's flank pain. Consider nonemergent lumbar spine MRI for further characterization. 4. Additional chronic/incidental findings detailed above. Aortic Atherosclerosis (ICD10-I70.0). Electronically Signed   By: Franki Cabot M.D.   On: 09/28/2018 20:25   US Abdomen Limited Ruq  Result Date:  09/29/2018 CLINICAL DATA:  Abnormal LFTs. EXAM: ULTRASOUND ABDOMEN LIMITED RIGHT UPPER QUADRANT COMPARISON:  Noncontrast CT yesterday. FINDINGS: Gallbladder: Physiologically distended. No gallstones or wall thickening visualized. No sonographic Murphy sign noted by sonographer. Common bile duct: Diameter: 4 mm, normal. Liver: No focal lesion identified. Diffusely increased and heterogeneous in parenchymal echogenicity. Portal vein is patent on color Doppler imaging with normal direction of blood flow towards the liver. Other: None. IMPRESSION: 1. Hepatic steatosis.  No evidence of focal lesion. 2. Normal sonographic appearance of the gallbladder and biliary tree. Electronically Signed   By: Keith Rake M.D.   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

## 2018-10-01 ENCOUNTER — Inpatient Hospital Stay (HOSPITAL_COMMUNITY): Payer: Medicare Other | Admitting: Anesthesiology

## 2018-10-01 ENCOUNTER — Encounter: Payer: Self-pay | Admitting: Cardiology

## 2018-10-01 ENCOUNTER — Encounter (HOSPITAL_COMMUNITY): Payer: Self-pay | Admitting: *Deleted

## 2018-10-01 ENCOUNTER — Encounter (HOSPITAL_COMMUNITY)
Admission: EM | Disposition: A | Discharge status: Home / Self Care | Payer: Self-pay | Source: Home / Self Care | Attending: Internal Medicine

## 2018-10-01 ENCOUNTER — Inpatient Hospital Stay (HOSPITAL_COMMUNITY): Payer: Medicare Other

## 2018-10-01 DIAGNOSIS — I340 Nonrheumatic mitral (valve) insufficiency: Secondary | ICD-10-CM

## 2018-10-01 DIAGNOSIS — I6389 Other cerebral infarction: Secondary | ICD-10-CM

## 2018-10-01 HISTORY — PX: BUBBLE STUDY: SHX6837

## 2018-10-01 HISTORY — PX: LOOP RECORDER INSERTION: EP1214

## 2018-10-01 HISTORY — PX: TEE WITHOUT CARDIOVERSION: SHX5443

## 2018-10-01 SURGERY — LOOP RECORDER INSERTION

## 2018-10-01 SURGERY — ECHOCARDIOGRAM, TRANSESOPHAGEAL
Anesthesia: Monitor Anesthesia Care

## 2018-10-01 MED ORDER — CLOPIDOGREL BISULFATE 75 MG PO TABS
75.0000 mg | ORAL_TABLET | Freq: Every day | ORAL | Status: DC
  Administered 2018-10-01 – 2018-10-02 (×2): 75 mg via ORAL
  Filled 2018-10-01 (×2): qty 1

## 2018-10-01 MED ORDER — PHENYLEPHRINE 40 MCG/ML (10ML) SYRINGE FOR IV PUSH (FOR BLOOD PRESSURE SUPPORT)
PREFILLED_SYRINGE | INTRAVENOUS | Status: DC | PRN
  Administered 2018-10-01 (×3): 80 ug via INTRAVENOUS

## 2018-10-01 MED ORDER — PROPOFOL 500 MG/50ML IV EMUL
INTRAVENOUS | Status: DC | PRN
  Administered 2018-10-01: 100 ug/kg/min via INTRAVENOUS

## 2018-10-01 MED ORDER — LIDOCAINE-EPINEPHRINE 1 %-1:100000 IJ SOLN
INTRAMUSCULAR | Status: AC
  Filled 2018-10-01: qty 1

## 2018-10-01 MED ORDER — LIDOCAINE VISCOUS HCL 2 % MT SOLN
OROMUCOSAL | Status: AC
  Filled 2018-10-01: qty 15

## 2018-10-01 MED ORDER — PROPOFOL 10 MG/ML IV BOLUS
INTRAVENOUS | Status: DC | PRN
  Administered 2018-10-01: 20 mg via INTRAVENOUS
  Administered 2018-10-01: 40 mg via INTRAVENOUS

## 2018-10-01 MED ORDER — LIDOCAINE VISCOUS HCL 2 % MT SOLN
OROMUCOSAL | Status: DC | PRN
  Administered 2018-10-01: 5 mL via OROMUCOSAL

## 2018-10-01 MED ORDER — LIDOCAINE-EPINEPHRINE 1 %-1:100000 IJ SOLN
INTRAMUSCULAR | Status: DC | PRN
  Administered 2018-10-01: 20 mL

## 2018-10-01 MED ORDER — ASPIRIN 81 MG PO CHEW
81.0000 mg | CHEWABLE_TABLET | Freq: Every day | ORAL | Status: DC
  Administered 2018-10-02: 81 mg via ORAL
  Filled 2018-10-01: qty 1

## 2018-10-01 SURGICAL SUPPLY — 2 items
PACK LOOP INSERTION (CUSTOM PROCEDURE TRAY) ×3 IMPLANT
SYSTEM MONITOR REVEAL LINQ II (Prosthesis & Implant Heart) ×2 IMPLANT

## 2018-10-01 NOTE — Progress Notes (Signed)
Occupational Therapy Treatment Patient Details Name: Shaun Ball MRN: [id number] DOB: 10-Apr-1950 Today's Date: 10/01/2018    History of present illness 68 year old male history of bladder cancer, hypertension presents today for right flank, back pain and confusion. Admitted 09/29/2018 and found to have multifocal subacute ischemia throughoutthe right cerebral hemisphere    OT comments  Pt presents supine in bed pleasant and willing to participate in therapy session. Pt participating in pill box test during this session to further address higher level cognition during simulated iADL task. Pt requiring approx 6-7 min to perform test and required min cues to ensure use of all pill bottles. With cue to ensure use of all pill bottles Pt was able to perform task with no further errors made. Pt continues to endorse some difficulty with concentrating, but does report it has improved since initial OT eval. Feel pt is likely nearing his baseline regarding higher level ADL tasks. Will continue per POC at this time.   Follow Up Recommendations  No OT follow up;Supervision - Intermittent    Equipment Recommendations  None recommended by OT          Precautions / Restrictions Precautions Precautions: None Restrictions Weight Bearing Restrictions: No       Mobility Bed Mobility Overal bed mobility: Modified Independent             General bed mobility comments: transitioning supine <>sitting without difficulty; HOB slightly elevated                                                   ADL either performed or assessed with clinical judgement   ADL Overall ADL's : Needs assistance/impaired                                       General ADL Comments: focus of session on completing pill box test. pt engaged in assessment, completing task in approx 6-7 min, required min cues to use/distribute pills from all pill bottles (noted pt had left one bottle out  upon completion), pt was able to accurately follow directions and distribute pills without any error                        Cognition Arousal/Alertness: Awake/alert Behavior During Therapy: WFL for tasks assessed/performed Overall Cognitive Status: Impaired/Different from baseline                                 General Comments: pt reports continued difficulty concentrating, reports has improved since initial admit; pt participated in pill box text this session (see ADL/clinical impression for detail)        Exercises     Shoulder Instructions       General Comments pt noted to have IV leaking from RUE, RN notified and addressing during session    Pertinent Vitals/ Pain       Pain Assessment: No/denies pain  Home Living                                          Prior Functioning/Environment  Frequency  Min 2X/week        Progress Toward Goals  OT Goals(current goals can now be found in the care plan section)  Progress towards OT goals: Progressing toward goals  Acute Rehab OT Goals Patient Stated Goal: return home OT Goal Formulation: With patient Time For Goal Achievement: 10/13/18 Potential to Achieve Goals: Good ADL Goals Pt Will Perform Grooming: Independently;standing Pt Will Perform Lower Body Dressing: Independently;sit to/from stand Pt Will Perform Toileting - Clothing Manipulation and hygiene: Independently;sit to/from stand Additional ADL Goal #1: Pt will participate in cognitive assessment to further assess safety/independence with higher level functional task.  Plan Discharge plan remains appropriate    Co-evaluation                 AM-PAC OT "6 Clicks" Daily Activity     Outcome Measure   Help from another person eating meals?: None Help from another person taking care of personal grooming?: None Help from another person toileting, which includes using toliet, bedpan, or urinal?:  None Help from another person bathing (including washing, rinsing, drying)?: None Help from another person to put on and taking off regular upper body clothing?: None Help from another person to put on and taking off regular lower body clothing?: None 6 Click Score: 24    End of Session    OT Visit Diagnosis: Muscle weakness (generalized) (M62.81);Other symptoms and signs involving the nervous system (R29.898)   Activity Tolerance Patient tolerated treatment well   Patient Left in bed;with call bell/phone within reach   Nurse Communication Mobility status(IV leaking)        Time: [id number] OT Time Calculation (min): 30 min  Charges: OT General Charges $OT Visit: 1 Visit OT Treatments $Therapeutic Activity: 23-37 mins  Shaun Ball, OT [facility] Pager [phone] Office [phone]    Shaun Ball 10/01/2018, 12:36 PM

## 2018-10-01 NOTE — Progress Notes (Signed)
STROKE TEAM PROGRESS NOTE   INTERVAL HISTORY Patient is sitting up comfortably in bed.  He states is doing well.  His vision loss is improved.  He did undergo carotid ultrasound which showed only 40 to 59% right ICA stenosis and hence will wait on revascularization.  Patient did sign consent for the sleep SMART study and underwent overnight Knox 3 monitoring for sleep apnea and tested positive and will undergo CPAP mask tolerance test tonight  Vitals:   10/01/18 1247 10/01/18 1347 10/01/18 1400 10/01/18 1407  BP: (!) 148/62 124/70 132/82 (!) 142/72  Pulse: 64 74 64 66  Resp: 20 (!) 24 (!) 21 16  Temp: (!) 97.4 F (36.3 C) 97.7 F (36.5 C)    TempSrc: Temporal Oral    SpO2: 96% 97% 97% 97%  Weight:      Height:        CBC:  Recent Labs  Lab 09/28/18 1818 09/29/18 0533  WBC 10.1 9.7  HGB 16.8 15.7  HCT 50.8 47.4  MCV 91.9 91.9  PLT 192 [id number]    Basic Metabolic Panel:  Recent Labs  Lab 09/29/18 0533 09/30/18 1004  NA 136 138  K 4.2 4.0  CL 106 105  CO2 26 25  GLUCOSE 105* 167*  BUN 13 11  CREATININE 1.05 1.14  CALCIUM 8.8* 8.8*   Lipid Panel:     Component Value Date/Time   CHOL 289 (H) 09/29/2018 0533   TRIG 181 (H) 09/29/2018 0533   HDL 34 (L) 09/29/2018 0533   CHOLHDL 8.5 09/29/2018 0533   VLDL 36 09/29/2018 0533   LDLCALC 219 (H) 09/29/2018 0533   HgbA1c:  Lab Results  Component Value Date   HGBA1C 6.0 (H) 09/29/2018   Urine Drug Screen: No results found for: LABOPIA, COCAINSCRNUR, LABBENZ, AMPHETMU, THCU, LABBARB  Alcohol Level No results found for: [facility]  IMAGING Vas US Carotid  Result Date: 09/30/2018 Carotid Arterial Duplex Study Indications:   CVA. Other Factors: Carotid stenosis by CTA. Performing Technologist: Toma Copier RVS  Examination Guidelines: A complete evaluation includes B-mode imaging, spectral Doppler, color Doppler, and power Doppler as needed of all accessible portions of each vessel. Bilateral testing is considered an  integral part of a complete examination. Limited examinations for reoccurring indications may be performed as noted.  Right Carotid Findings: +----------+--------+--------+--------+----------------------+-----------------+           PSV cm/sEDV cm/sStenosisPlaque Description    Comments          +----------+--------+--------+--------+----------------------+-----------------+ CCA Prox  111     17                                                      +----------+--------+--------+--------+----------------------+-----------------+ CCA Distal105     23                                    mild imntimal                                                             wall changes      +----------+--------+--------+--------+----------------------+-----------------+  ICA Prox  153     45      40-59%  heterogenous          mild to moderate                                                          palque on the far                                                         wall              +----------+--------+--------+--------+----------------------+-----------------+ ICA Mid   140     36                                                      +----------+--------+--------+--------+----------------------+-----------------+ ICA Distal119     38                                    tortuous          +----------+--------+--------+--------+----------------------+-----------------+ ECA       169     17              focal and heterogenousmild at the                                                               origin far wall   +----------+--------+--------+--------+----------------------+-----------------+ +----------+--------+-------+--------+-------------------+           PSV cm/sEDV cmsDescribeArm Pressure (mmHG) +----------+--------+-------+--------+-------------------+ Subclavian101                                         +----------+--------+-------+--------+-------------------+ +---------+--------+--+--------+-+ VertebralPSV cm/s46EDV cm/s6 +---------+--------+--+--------+-+   Left Carotid Findings: +----------+-------+--------+--------+----------------+-----------------------+           PSV    EDV cm/sStenosisPlaque          Comments                          cm/s                   Description                             +----------+-------+--------+--------+----------------+-----------------------+ CCA Prox  138    23                                                      +----------+-------+--------+--------+----------------+-----------------------+  CCA Distal122    21                              mild intimal wall                                                        changes                 +----------+-------+--------+--------+----------------+-----------------------+ ICA Prox  122    22      1-39%   heterogenous    mild plaque             +----------+-------+--------+--------+----------------+-----------------------+ ICA Mid   100    20                              mild intimal wall                                                        changes                 +----------+-------+--------+--------+----------------+-----------------------+ ICA Distal90     24                              tortuous                +----------+-------+--------+--------+----------------+-----------------------+ ECA       182    24                              Mild intimal wall                                                        changes                 +----------+-------+--------+--------+----------------+-----------------------+ +----------+--------+--------+--------+-------------------+ SubclavianPSV cm/sEDV cm/sDescribeArm Pressure (mmHG) +----------+--------+--------+--------+-------------------+           249                                          +----------+--------+--------+--------+-------------------+ +---------+--------+--+--------+--+ VertebralPSV cm/s63EDV cm/s10 +---------+--------+--+--------+--+   Summary: Right Carotid: Velocities in the right ICA are consistent with a 40-59%                stenosis. Left Carotid: Velocities in the left ICA are consistent with a 1-39% stenosis. Vertebrals:  Bilateral vertebral arteries demonstrate antegrade flow. Subclavians: Normal flow hemodynamics were seen in bilateral subclavian              arteries. *See table(s) above for measurements and observations.     Preliminary     PHYSICAL EXAM Pleasant middle-aged Caucasian male not in distress. . Afebrile. Head is nontraumatic.  Neck is supple without bruit.    Cardiac exam no murmur or gallop. Lungs are clear to auscultation. Distal pulses are well felt. Neurological Exam ;  Awake  Alert oriented x 3. Normal speech and language.eye movements full without nystagmus.fundi were not visualized.  Partial left homonymous hemianopsia.  Hearing is normal. Palatal movements are normal. Face symmetric. Tongue midline. Normal strength, tone, reflexes and coordination. Normal sensation. Gait deferred.  ASSESSMENT/PLAN Shaun Ball is a 68 y.o. male with history of HTN presenting with confusion, forgetful, trouble using cell phone.   Stroke:  Scattered right MCA infarcts,  Embolic secondary to unknown source - possible R ICA stenosis  CTA head  streak artifact cannot see R VA. Intracranial atherosclerosis. Mild/mod narrowing R VA, BA and B ICAs. Parenchymal hypodensities R cerebral cortex suspicious for infarcts   CTA neck prox R cervical ICA < 50%  MRI  Moderate acute/subacte R temporal occipital lobe infarct w/ mild petechial hemorrhage. Numerous small R MCA infarcts (frontal, parietal, occipital lobes) and R basal ganglia. Mod small vessel disease.   Carotid Doppler  pending . If neg, will do TEE and loop tomorrow. Have contacted cards to  secure spot if possible.  2D Echo EF 60-65%. No source of embolus   LDL 219  HgbA1c 6.0  SCDs for VTE prophylaxis  No antithrombotic prior to admission, now on aspirin 325 mg daily. Given  mild stroke, recommend aspirin 81 mg and plavix 75 mg daily x 3 weeks, then aspirin alone. Will hold adding plavix until carotid dopplers resulted in the event surgery is considered  Therapy recommendations:  No PT, no OT, OP SLP  Disposition:  Return home w/ OP SLP  Hypertension  Stable . Permissive hypertension (OK if < 220/120) but gradually normalize in 5-7 days . Long-term BP goal normotensive  Hyperlipidemia  Home meds:  No statin  Hx statin intolerance  started on lipitor 40 and zetia 10  LDL 219, goal < 70  Continue statin at discharge  Other Stroke Risk Factors  Advanced age  Former Cigarette smoker, quit 18 yrs ago  ETOH use, alcohol level No results found for: ETH, advised to drink no more than 2 drink(s) a day  s.   Other Active Problems  Back pain  Hx bladder cancer  Hospital day # 3     He has presented with embolic right MCA infarct a week ago and CT angiogram shows borderline 50%   recommend TEE and loop recorder for cardiac source of embolism.  If these tests are not done today and patient is discharged over the weekend this could be arranged as an outpatient.  Aspirin and Plavix for 3 weeks followed by Plavix alone.  Patient was advised not to drive till his peripheral vision loss improves.  Patient is participating in Sleep Smart trial and tested positive on knox 3 monitor.  He will have the CPAP mask tolerability test tonight and if he can tolerated will be randomized into the study tomorrow.  Discussed with patient and Dr. Lonny Prude.  Greater than 50% time during this 25-minute visit was spent on counseling and coordination of care about embolic stroke and carotid stenosis and answering questions   Antony Contras, MD Medical Director [facility] Pager: [phone] 10/01/2018 2:18 PM   To contact Stroke Continuity provider, please refer to [email or web address] After hours, contact General Neurology

## 2018-10-01 NOTE — Care Management Important Message (Signed)
Important Message  Patient Details  Name: Shaun Ball MRN: [id number] Date of Birth: 1950/04/12   Medicare Important Message Given:  Yes     Orbie Pyo 10/01/2018, 4:08 PM

## 2018-10-01 NOTE — Anesthesia Preprocedure Evaluation (Signed)
Anesthesia Evaluation  Patient identified by MRN, date of birth, ID band Patient awake    Reviewed: Allergy & Precautions, H&P , NPO status , Patient's Chart, lab work & pertinent test results  History of Anesthesia Complications (+) PONV and history of anesthetic complications  Airway Mallampati: II   Neck ROM: full    Dental   Pulmonary former smoker,    breath sounds clear to auscultation       Cardiovascular hypertension,  Rhythm:regular Rate:Normal     Neuro/Psych CVA    GI/Hepatic   Endo/Other    Renal/GU      Musculoskeletal  (+) Arthritis ,   Abdominal   Peds  Hematology   Anesthesia Other Findings   Reproductive/Obstetrics                             Anesthesia Physical Anesthesia Plan  ASA: III  Anesthesia Plan: MAC   Post-op Pain Management:    Induction: Intravenous  PONV Risk Score and Plan: 2 and Propofol infusion and Treatment may vary due to age or medical condition  Airway Management Planned: Nasal Cannula  Additional Equipment:   Intra-op Plan:   Post-operative Plan:   Informed Consent: I have reviewed the patients History and Physical, chart, labs and discussed the procedure including the risks, benefits and alternatives for the proposed anesthesia with the patient or authorized representative who has indicated his/her understanding and acceptance.       Plan Discussed with: CRNA, Anesthesiologist and Surgeon  Anesthesia Plan Comments:         Anesthesia Quick Evaluation

## 2018-10-01 NOTE — Progress Notes (Signed)
[facility]  PROGRESS NOTE  Shaun Ball [id number] DOB: 08-13-1950 DOA: 09/28/2018 PCP: Reynold Bowen, MD  Brief History    Shaun Ball is a 68 y.o. year old male with medical history significant for bladder cancer who presented on 09/28/2018 with right flank pain and back pain and confusion that started several days before admission and was found to have multifocal subacute ischemia throughout the right cerebral hemisphere. ransferred from [facility] to [facility] for MRI brain  A & P   Subacute right cerebral cryptogenic stroke, multifocal hypodensities on CT head concerning for multifocal subacute infarcts which confirmed scattered right MCA infarcts.  Concern for embolic source, CTA neck shows right cervical ICA less than 50%, carotid Dopplers confirmed.  Patient underwent TEE on 8/21, as well as loop recorder insertion for evaluation of potential arrhythmia.  Given no interventions plan will add Plavix to aspirin 3 weeks followed by Plavix alone per neurology recommendations  On discharge patient will go home with speech therapy.   Sinus Pause. Occurred on 8/20 during carotid ultrasound. No PPM indication as has not reoccurred per cardiology. Loop monitor implanted for above problem       Hyperlipidemia.  LDL 219.  Start Lipitor 40 mg along with Zetia.  Patient reports previous intolerance of Crestor ("whelps") in the past doing well on atorvastatin here.,  Monitoring LFTs   Back pain, likely due to spinal stenosis based off CT abdomen obtained to rule out renal stone.  No neurologic deficits here, will benefit from continued outpatient work-up on discharge with PCP, discussed with patient and his son.   Hypertension, blood pressure in the last 24 hours doing well.  Will allow permissive hypertension given stroke, hold home lisinopril   Hepatic steatosis.  Right upper quadrant ultrasound obtained for evaluation of ALT on admission, hepatitis panel  unremarkable   BPH, stable.  Continue Flomax    Sleep smart trial.  Patient undergoing CPAP mask tolerability tonight     DVT prophylaxis: SCDs Code Status: Full code Family Communication: No family at bedside, updated son on 8/19 at bedside Disposition Plan: Added Plavix with no intervention, anticipate discharge in next 24 hours     [facility] Direct contact: see [email or web address] (further directions at bottom of note if needed) 7PM-7AM contact night coverage as at bottom of note 10/01/2018, 5:32 PM  LOS: 3 days   Consultants   Neurology  Procedures     Antibiotics   None  Interval History/Subjective  Has no acute complaints.  Feels well. Feels vision improving  Objective   Vitals:  Vitals:   10/01/18 1400 10/01/18 1407  BP: 132/82 (!) 142/72  Pulse: 64 66  Resp: (!) 21 16  Temp:    SpO2: 97% 97%    Exam:  Awake Alert, Oriented X 3, No new F.N deficits, Normal affect [name],PERRAL Supple Neck,No JVD,  Symmetrical Chest wall movement, Good air movement bilaterally, CTAB RRR,No Gallops,Rubs or new Murmurs, No Parasternal Heave +ve B.Sounds, Abd Soft, No tenderness, No rebound - guarding or rigidity. No Cyanosis, Clubbing or edema, No new Rash or bruise   I have personally reviewed the following:   Data Reviewed: Basic Metabolic Panel: Recent Labs  Lab 09/28/18 1818 09/29/18 0533 09/30/18 1004  NA 135 136 138  K 4.4 4.2 4.0  CL 101 106 105  CO2 23 26 25   GLUCOSE 110* 105* 167*  BUN 14 13 11   CREATININE 1.09 1.05 1.14  CALCIUM 9.6 8.8* 8.8*  Liver Function Tests: Recent Labs  Lab 09/28/18 1818 09/29/18 0533 09/30/18 1004  AST 38 33 36  ALT 69* 58* 61*  ALKPHOS 61 56 52  BILITOT 0.9 1.0 1.3*  PROT 8.1 7.2 6.5  ALBUMIN 4.6 3.9 3.6   Recent Labs  Lab 09/28/18 1818  LIPASE 34   No results for input(s): AMMONIA in the last 168 hours. CBC: Recent Labs  Lab 09/28/18 1818 09/29/18 0533  WBC 10.1 9.7  HGB 16.8 15.7  HCT  50.8 47.4  MCV 91.9 91.9  PLT 192 169   Cardiac Enzymes: Recent Labs  Lab 09/29/18 0533  CKTOTAL 52  CKMB 0.9   BNP (last 3 results) No results for input(s): BNP in the last 8760 hours.  ProBNP (last 3 results) No results for input(s): PROBNP in the last 8760 hours.  CBG: No results for input(s): GLUCAP in the last 168 hours.  Recent Results (from the past 240 hour(s))  Urine Culture     Status: None   Collection Time: 09/28/18  6:15 PM   Specimen: Urine, Random  Result Value Ref Range Status   Specimen Description   Final    URINE, RANDOM Performed at [facility] [address] [place], [place] [address]    Special Requests   Final    NONE Performed at [facility], [place] [address], [place], [place] [address]    Culture   Final    NO GROWTH Performed at [facility], [place] [address] [place], [place] [address]    Report Status 09/30/2018 FINAL  Final  SARS Coronavirus 2 [facility] order, Performed in [facility] hospital lab) Nasopharyngeal Nasopharyngeal Swab     Status: None   Collection Time: 09/28/18 11:16 PM   Specimen: Nasopharyngeal Swab  Result Value Ref Range Status   SARS Coronavirus 2 NEGATIVE NEGATIVE Final    Comment: (NOTE) If result is NEGATIVE SARS-CoV-2 target nucleic acids are NOT DETECTED. The SARS-CoV-2 RNA is generally detectable in upper and lower  respiratory specimens during the acute phase of infection. The lowest  concentration of SARS-CoV-2 viral copies this assay can detect is 250  copies / mL. A negative result does not preclude SARS-CoV-2 infection  and should not be used as the sole basis for treatment or other  patient management decisions.  A negative result may occur with  improper specimen collection / handling, submission of specimen other  than nasopharyngeal swab, presence of viral mutation(s) within the  areas targeted by this assay, and inadequate number of viral copies  (<250  copies / mL). A negative result must be combined with clinical  observations, patient history, and epidemiological information. If result is POSITIVE SARS-CoV-2 target nucleic acids are DETECTED. The SARS-CoV-2 RNA is generally detectable in upper and lower  respiratory specimens dur ing the acute phase of infection.  Positive  results are indicative of active infection with SARS-CoV-2.  Clinical  correlation with patient history and other diagnostic information is  necessary to determine patient infection status.  Positive results do  not rule out bacterial infection or co-infection with other viruses. If result is PRESUMPTIVE POSTIVE SARS-CoV-2 nucleic acids MAY BE PRESENT.   A presumptive positive result was obtained on the submitted specimen  and confirmed on repeat testing.  While 2019 novel coronavirus  (SARS-CoV-2) nucleic acids may be present in the submitted sample  additional confirmatory testing may be necessary for epidemiological  and / or clinical management purposes  to differentiate  between  SARS-CoV-2 and other Sarbecovirus currently known to infect humans.  If clinically indicated additional testing with an alternate test  methodology [phone]) is advised. The SARS-CoV-2 RNA is generally  detectable in upper and lower respiratory sp ecimens during the acute  phase of infection. The expected result is Negative. Fact Sheet for Patients:  StrictlyIdeas.no Fact Sheet for Healthcare Providers: BankingDealers.co.za This test is not yet approved or cleared by the [place] FDA and has been authorized for detection and/or diagnosis of SARS-CoV-2 by FDA under an Emergency Use Authorization (EUA).  This EUA will remain in effect (meaning this test can be used) for the duration of the COVID-19 declaration under Section 564(b)(1) of the Act, 21 U.S.C. section 360bbb-3(b)(1), unless the authorization is terminated or revoked  sooner. Performed at [facility], [place] [address], [place], [place] [address]      Studies: Vas US Carotid  Result Date: 10/01/2018 Carotid Arterial Duplex Study Indications:   CVA. Other Factors: Carotid stenosis by CTA. Performing Technologist: Toma Copier RVS  Examination Guidelines: A complete evaluation includes B-mode imaging, spectral Doppler, color Doppler, and power Doppler as needed of all accessible portions of each vessel. Bilateral testing is considered an integral part of a complete examination. Limited examinations for reoccurring indications may be performed as noted.  Right Carotid Findings: +----------+--------+--------+--------+----------------------+-----------------+             PSV cm/s EDV cm/s Stenosis Plaque Description     Comments           +----------+--------+--------+--------+----------------------+-----------------+  CCA Prox   111      17                                                          +----------+--------+--------+--------+----------------------+-----------------+  CCA Distal 105      23                                       mild imntimal                                                                    wall changes       +----------+--------+--------+--------+----------------------+-----------------+  ICA Prox   153      45       40-59%   heterogenous           mild to moderate                                                                 palque on the far  wall               +----------+--------+--------+--------+----------------------+-----------------+  ICA Mid    140      36                                                          +----------+--------+--------+--------+----------------------+-----------------+  ICA Distal 119      38                                       tortuous           +----------+--------+--------+--------+----------------------+-----------------+   ECA        169      17                focal and heterogenous mild at the                                                                      origin far wall    +----------+--------+--------+--------+----------------------+-----------------+ +----------+--------+-------+--------+-------------------+             PSV cm/s EDV cms Describe Arm Pressure (mmHG)  +----------+--------+-------+--------+-------------------+  Subclavian 101                                            +----------+--------+-------+--------+-------------------+ +---------+--------+--+--------+-+  Vertebral PSV cm/s 46 EDV cm/s 6  +---------+--------+--+--------+-+   Left Carotid Findings: +----------+-------+--------+--------+----------------+-----------------------+             PSV     EDV cm/s Stenosis Plaque           Comments                             cm/s                      Description                               +----------+-------+--------+--------+----------------+-----------------------+  CCA Prox   138     23                                                          +----------+-------+--------+--------+----------------+-----------------------+  CCA Distal 122     21                                 mild intimal wall  changes                  +----------+-------+--------+--------+----------------+-----------------------+  ICA Prox   122     22       1-39%    heterogenous     mild plaque              +----------+-------+--------+--------+----------------+-----------------------+  ICA Mid    100     20                                 mild intimal wall                                                               changes                  +----------+-------+--------+--------+----------------+-----------------------+  ICA Distal 90      24                                 tortuous                 +----------+-------+--------+--------+----------------+-----------------------+   ECA        182     24                                 Mild intimal wall                                                               changes                  +----------+-------+--------+--------+----------------+-----------------------+ +----------+--------+--------+--------+-------------------+  Subclavian PSV cm/s EDV cm/s Describe Arm Pressure (mmHG)  +----------+--------+--------+--------+-------------------+             249                                             +----------+--------+--------+--------+-------------------+ +---------+--------+--+--------+--+  Vertebral PSV cm/s 63 EDV cm/s 10  +---------+--------+--+--------+--+   Summary: Right Carotid: Velocities in the right ICA are consistent with a 40-59%                stenosis. Left Carotid: Velocities in the left ICA are consistent with a 1-39% stenosis. Vertebrals:  Bilateral vertebral arteries demonstrate antegrade flow. Subclavians: Normal flow hemodynamics were seen in bilateral subclavian              arteries. *See table(s) above for measurements and observations.  Electronically signed by Antony Contras MD on 10/01/2018 at 3:36:28 PM.    Final     Scheduled Meds:   stroke: mapping our early stages of recovery book   Does not apply Once   aspirin  300 mg Rectal Daily   Or   aspirin  325 mg  Oral Daily   atorvastatin  40 mg Oral q1800   ezetimibe  10 mg Oral Daily   tamsulosin  0.4 mg Oral QPC breakfast   Continuous Infusions:   Principal Problem:   Rt cerebral stroke / multifocal subacute ischemia of Rt Cerebral Hemisphere Active Problems:   Essential hypertension   Abnormal liver function   Confusion      Desiree Hane  [facility]

## 2018-10-01 NOTE — Interval H&P Note (Signed)
History and Physical Interval Note:  10/01/2018 12:10 PM  Shaun Ball  has presented today for surgery, with the diagnosis of STROKE.  The various methods of treatment have been discussed with the patient and family. After consideration of risks, benefits and other options for treatment, the patient has consented to  Procedure(s): TRANSESOPHAGEAL ECHOCARDIOGRAM (TEE) (N/A) as a surgical intervention.  The patient's history has been reviewed, patient examined, no change in status, stable for surgery.  I have reviewed the patient's chart and labs.  Questions were answered to the patient's satisfaction.     Dorris Carnes

## 2018-10-01 NOTE — Transfer of Care (Signed)
Immediate Anesthesia Transfer of Care Note  Patient: Shaun Ball  Procedure(s) Performed: TRANSESOPHAGEAL ECHOCARDIOGRAM (TEE) (N/A ) BUBBLE STUDY  Patient Location: PACU and Endoscopy Unit  Anesthesia Type:MAC  Level of Consciousness: awake, drowsy and patient cooperative  Airway & Oxygen Therapy: Patient Spontanous Breathing  Post-op Assessment: Report given to RN and Post -op Vital signs reviewed and stable  Post vital signs: Reviewed and stable  Last Vitals:  Vitals Value Taken Time  BP    Temp    Pulse    Resp    SpO2      Last Pain:  Vitals:   10/01/18 1247  TempSrc: Temporal  PainSc: 0-No pain         Complications: No apparent anesthesia complications

## 2018-10-01 NOTE — Consult Note (Addendum)
ELECTROPHYSIOLOGY CONSULT NOTE  Patient ID: [name] MRN: [id number], DOB/AGE: 69-Nov-1952   Admit date: 09/28/2018 Date of Consult: 10/01/2018  Primary Physician: Reynold Bowen, MD Primary Cardiologist: none Reason for Consultation: Cryptogenic stroke ; recommendations regarding Implantable Loop Recorder, requested by Dr. Leonie Man  History of Present Illness Shaun Ball was admitted on 09/28/2018 with confusion/disorientation, stroke.  He first developed symptoms for a few days at home.     Neurology noted:Scattered right MCA infarcts,  Embolic secondary to unknown source.  he has undergone workup for stroke including echocardiogram and carotid dopplers.  The patient has been monitored on telemetry which has demonstrated sinus rhythm with no arrhythmias.  Inpatient stroke work-up is to be completed with a TEE.   Echocardiogram this admission demonstrated  IMPRESSIONS 1. The left ventricle has normal systolic function with an ejection fraction of 60-65%. The cavity size was normal. Left ventricular diastolic parameters were normal.  2. The right ventricle has normal systolic function. The cavity was normal. There is no increase in right ventricular wall thickness.  3. Mild thickening of the mitral valve leaflet.  4. The aortic valve is tricuspid. Mild thickening of the aortic valve. Sclerosis without any evidence of stenosis of the aortic valve.  5. The aorta is normal unless otherwise noted.    Lab work is reviewed.    Prior to admission, the patient denies chest pain, shortness of breath, dizziness, palpitations, or syncope.  They are recovering from their stroke with plans to home at discharge.  Yesterday during his carotid US (he reports left side), he fainted.  Said he felt it coming, felt like his body was stiffening up as he was going out and then back.  He has never had any similar symptoms, no dizzy spells, no near syncope or syncope.  No symptoms with looking up,  turing his head.  He reports hard physical work regularly with no difficulties or symptoms     Past Medical History:  Diagnosis Date   Arthritis    Bladder cancer ([place])    Dysuria    History of kidney stones 2012   passed / has renal cyst to evaluated at later time   Hypertension    Lesion of bladder    Pneumonia    hx of    PONV (postoperative nausea and vomiting)    10 yrs ago  no problems with last 2 surgeries   Wears glasses      Surgical History:  Past Surgical History:  Procedure Laterality Date   CYSTOSCOPY N/A 11/05/2016   Procedure: CYSTOSCOPY;  Surgeon: Ceasar Mons, MD;  Location: [facility];  Service: Urology;  Laterality: N/A;   CYSTOSCOPY WITH BIOPSY Bilateral 10/24/2016   Procedure: CYSTOSCOPY WITH BLADDER BIOPSY/ BILATERAL RETROGRADE [id number];  Surgeon: Ceasar Mons, MD;  Location: [facility];  Service: Urology;  Laterality: Bilateral;   CYSTOSCOPY WITH BIOPSY N/A 05/12/2018   Procedure: CYSTOSCOPY WITH BLADDER BIOPSY/ BILATERAL RETROGRADE PYELOGRAM BIOPSY;  Surgeon: Ceasar Mons, MD;  Location: [facility];  Service: Urology;  Laterality: N/A;   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   09/28/2018 at Unknown time   lisinopril (PRINIVIL,ZESTRIL) 40 MG tablet Take 40 mg by mouth every evening.    09/27/2018 at Unknown time   montelukast (SINGULAIR)  10 MG tablet Take 10 mg by mouth daily as needed (allergies).   6 unknown   phenazopyridine (PYRIDIUM) 200 MG tablet Take 1 tablet (200 mg total) by mouth 3 (three) times daily as needed (for pain with urination). 30 tablet 0 unknown   sulfamethoxazole-trimethoprim (BACTRIM DS,SEPTRA DS) 800-160 MG tablet Take 1 tablet by mouth 2 (two) times daily. (Patient taking differently: Take 1 tablet by mouth once. ) 6 tablet 0 09/28/2018 at Unknown time   tamsulosin (FLOMAX) 0.4 MG CAPS capsule Take 1 capsule (0.4 mg total) by mouth daily. (Patient taking differently: Take 0.4 mg by mouth daily after breakfast. ) 30 capsule 11 09/28/2018 at Unknown time   acetaminophen (TYLENOL) 500 MG tablet Take 500 mg by mouth every 6 (six) hours as needed for moderate pain.   unknown   HYDROcodone-acetaminophen (NORCO/VICODIN) 5-325 MG tablet Take 1 tablet by mouth every 6 (six) hours as needed for moderate pain. (Patient not taking: Reported on 05/07/2018) 20 tablet 0 Completed Course at Unknown time   oxybutynin (DITROPAN) 5 MG tablet Take 1 tablet (5 mg total) by mouth every 8 (eight) hours as needed for bladder spasms. (Patient not taking: Reported on 09/28/2018) 30 tablet 0 Completed Course at Unknown time    Inpatient Medications:    stroke: mapping our early stages of recovery book   Does not apply Once   aspirin  300 mg Rectal Daily   Or   aspirin  325 mg Oral Daily   atorvastatin  40 mg Oral q1800   ezetimibe  10 mg Oral Daily   tamsulosin  0.4 mg Oral QPC breakfast    Allergies:  Allergies  Allergen Reactions   Statins Hives and Rash    whelps,liver enzymes elevated   Iohexol Other (See Comments)    Pt had sneezing after his CT with IV contrast, lasting only 10- 15 minutes, no intervention was needed, per radiologist    Social History   Socioeconomic History   Marital status: Legally Separated    Spouse name: Not on file   Number of children: Not on file   Years of education: Not  on file   Highest education level: Not on file  Occupational History   Not on file  Social Needs   Financial resource strain: Not on file   Food insecurity    Worry: Not on file    Inability: Not on file   Transportation needs    Medical: Not on file    Non-medical: Not on file  Tobacco Use   Smoking status: Former Smoker    Years: 28.00    Types: Cigarettes    Quit date: 10/22/1999    Years since quitting: 18.9   Smokeless tobacco: Never Used  Substance and Sexual Activity   Alcohol use: Yes    Alcohol/week: 7.0 standard drinks    Types: 7 Cans of beer per week    Comment: occ    Drug use: No   Sexual activity: Not on file  Lifestyle   Physical activity    Days per week: Not on file    Minutes per session: Not on file  Stress: Not on file  Relationships   Social connections    Talks on phone: Not on file    Gets together: Not on file    Attends religious service: Not on file    Active member of club or organization: Not on file    Attends meetings of clubs or organizations: Not on file    Relationship status: Not on file   Intimate partner violence    Fear of current or ex partner: Not on file    Emotionally abused: Not on file    Physically abused: Not on file    Forced sexual activity: Not on file  Other Topics Concern   Not on file  Social History Narrative   Not on file     Family History  Problem Relation Age of Onset   Blindness Mother    Cataracts Mother    Diabetes Mother    Glaucoma Mother    Macular degeneration Mother    Cataracts Father    Diabetes Father    Diabetes Sister    Diabetes Brother    Blindness Maternal Uncle    Macular degeneration Maternal Uncle    Macular degeneration Paternal Aunt    Amblyopia Neg Hx    Retinal detachment Neg Hx    Strabismus Neg Hx    Retinitis pigmentosa Neg Hx       Review of Systems: All other systems reviewed and are otherwise negative except as noted  above.  Physical Exam: Vitals:   09/30/18 2011 10/01/18 0023 10/01/18 0425 10/01/18 0900  BP: (!) 151/79 130/62 (!) 120/59 136/90  Pulse: 76 70 67 66  Resp: 19 (!) 21 20 13   Temp: 98 F (36.7 C) 98.3 F (36.8 C) 98.3 F (36.8 C) (!) 97.5 F (36.4 C)  TempSrc: Oral Axillary Axillary Axillary  SpO2: 98% 97% 96% 94%  Weight:      Height:        GEN- The patient is well appearing, alert and oriented x 3 today.   Head- normocephalic, atraumatic Eyes-  Sclera clear, conjunctiva pink Ears- hearing intact Oropharynx- clear Neck- supple Lungs- CTA b/l, normal work of breathing Heart- RRR, no murmurs, rubs or gallops  GI- soft, NT, ND Extremities- no clubbing, cyanosis, or edema MS- no significant deformity or atrophy Skin- no rash or lesion Psych- euthymic mood, full affect   Labs:   Lab Results  Component Value Date   WBC 9.7 09/29/2018   HGB 15.7 09/29/2018   HCT 47.4 09/29/2018   MCV 91.9 09/29/2018   PLT 169 09/29/2018    Recent Labs  Lab 09/30/18 1004  NA 138  K 4.0  CL 105  CO2 25  BUN 11  CREATININE 1.14  CALCIUM 8.8*  PROT 6.5  BILITOT 1.3*  ALKPHOS 52  ALT 61*  AST 36  GLUCOSE 167*   Lab Results  Component Value Date   CKTOTAL 52 09/29/2018   CKMB 0.9 09/29/2018   Lab Results  Component Value Date   CHOL 289 (H) 09/29/2018   Lab Results  Component Value Date   HDL 34 (L) 09/29/2018   Lab Results  Component Value Date   LDLCALC 219 (H) 09/29/2018   Lab Results  Component Value Date   TRIG 181 (H) 09/29/2018   Lab Results  Component Value Date   CHOLHDL 8.5 09/29/2018   No results found for: LDLDIRECT  No results found for: DDIMER   Radiology/Studies:   Ct Angio Head W Or Wo  Contrast Result Date: 09/29/2018 CLINICAL DATA:  Stroke, follow-up. Additional history: Confusion for the last 5 days, forgetfulness, difficulty using phone, son concern for visual changes. EXAM: CT ANGIOGRAPHY HEAD AND NECK TECHNIQUE: Multidetector CT  imaging of the head and neck was performed using the standard protocol during bolus administration of intravenous contrast. Multiplanar CT image reconstructions and MIPs were obtained to evaluate the vascular anatomy. Carotid stenosis measurements (when applicable) are obtained utilizing NASCET criteria, using the distal internal carotid diameter as the denominator. CONTRAST:  159mL OMNIPAQUE IOHEXOL 350 MG/ML SOLN COMPARISON:  Head CT 09/28/2018 FINDINGS: CTA NECK FINDINGS Aortic arch: Standard branching. Atherosclerotic calcification within the imaged aortic arch and major branch vessels. No high-grade narrowing of the major branch vessel origins. Right carotid system: Atherosclerotic plaque at the right carotid bifurcation with less than 50% stenosis of the proximal right cervical internal carotid artery. Left carotid system: Atherosclerotic plaque at the left carotid bifurcation without significant stenosis (50% or greater). Calcified plaque also present within the distal cervical left ICA without significant stenosis (50% or greater). Vertebral arteries: Scattered atherosclerotic plaque within the bilateral cervical vertebral arteries without significant stenosis (50% or greater). Skeleton: Cervical spondylosis without high-grade bony spinal canal stenosis. No suspicious lytic or blastic osseous lesions. Other neck: No soft tissue neck mass or pathologically enlarged cervical chain lymph nodes. Upper chest: No confluent consolidation within the imaged lung apices. Review of the MIP images confirms the above findings CTA HEAD FINDINGS Anterior circulation: Calcified and noncalcified plaque within the bilateral carotid siphons with no more than mild luminal narrowing. The right middle and anterior cerebral arteries are patent without significant proximal stenosis. The left middle and anterior cerebral arteries are patent without significant proximal stenosis. No anterior circulation aneurysm is identified.  Posterior circulation: Scattered atherosclerotic plaque within the intracranial vertebrobasilar system. Streak artifact from dental restoration limits evaluation of the non dominant right vertebral artery at the level of the skull base, and stenosis at the level of the foramen magnum is difficult to exclude. Mild/moderate narrowing of the distal right vertebral artery just proximal to the vertebrobasilar junction. The left vertebral artery is patent without significant stenosis. Atherosclerotic irregularity of the basilar artery with mild luminal narrowing of the proximal basilar artery. The bilateral posterior cerebral arteries are patent without significant proximal stenosis. Posterior communicating arteries are present bilaterally. No posterior circulation aneurysm is identified. Venous sinuses: Within the limitations of contrast timing, no evidence of thrombosis. Anatomic variants: None of significance. Other: Right maxillary sinus mucous retention cyst. Review of the MIP images confirms the above findings IMPRESSION: CTA HEAD: Streak artifact from dental restoration limits evaluation of the distal right vertebral artery at the level of the foramen magnum, and a stenosis at this site cannot be excluded. Intracranial atherosclerotic disease. Mild/moderate narrowing of the non dominant distal right vertebral artery just proximal to the vertebrobasilar junction. Additional sites of mild atherosclerotic luminal narrowing within the basilar and bilateral intracranial internal carotid arteries. Parenchymal hypodensities within the right cerebral cortex suspicious for multifocal subacute infarcts were better appreciated on prior head CT 09/28/2018. Consider brain MRI for further evaluation. No intracranial aneurysm identified. CTA NECK: The common carotid, cervical internal carotid and cervical vertebral arteries are patent within the neck. Atherosclerotic disease results in less than 50% narrowing of the proximal  cervical right ICA. Electronically Signed   By: Kellie Simmering   On: 09/29/2018 09:03     Ct Head Wo Contrast Result Date: 09/28/2018 CLINICAL DATA:  Altered level of consciousness.  EXAM: CT HEAD WITHOUT CONTRAST TECHNIQUE: Contiguous axial images were obtained from the base of the skull through the vertex without intravenous contrast. COMPARISON:  None. FINDINGS: Brain: Moderate region of ill-defined cortical low-density in the right occipital and posterior temporal region, nonspecific but suspicious for subacute ischemia. Small ill-defined cortical low-density in the right frontal lobe. Low-density in the right basal ganglia suspicious for chronic or subacute ischemia. No hemorrhage. No midline shift, hydrocephalus, or subdural/extra-axial collection. Cerebellum is unremarkable. Vascular: No hyperdense vessel, with special attention to the right MCA. Mild atherosclerosis of skull base vasculature. Skull: No fracture or focal lesion. Sinuses/Orbits: Paranasal sinuses and mastoid air cells are clear. The visualized orbits are unremarkable. Other: None. IMPRESSION: 1. Findings suspicious for multifocal subacute ischemia throughout the right cerebral hemisphere. Recommend MRI for further characterization to exclude the possibility of underlying lesions. 2. No hemorrhage. No hyperdense vessel, with special attention to the right MCA. Electronically Signed   By: Keith Rake M.D.   On: 09/28/2018 23:01     Mr Brain Wo Contrast Addendum Date: 09/29/2018   ADDENDUM REPORT: 09/29/2018 11:12 ADDENDUM: These results were called by telephone at the time of interpretation on 09/29/2018 at 11:11 am to Dr. Lupita Raider, who verbally acknowledged these results. Electronically Signed   By: Kellie Simmering   On: 09/29/2018 11:12  Result Date: 09/29/2018 CLINICAL DATA:  Focal neuro deficit, greater than 6 hours, stroke suspected. EXAM: MRI HEAD WITHOUT CONTRAST TECHNIQUE: Multiplanar, multiecho pulse sequences of the  brain and surrounding structures were obtained without intravenous contrast. COMPARISON:  CT angiography head/neck performed earlier the same day 09/29/2018, head CT 09/28/2018 FINDINGS: Brain: Multiple sequences are motion degraded. There are several acute/subacute infarcts within the right basal ganglia, the largest within the posterior limb of right internal and posterior lentiform nucleus measuring 1.6 x 0.9 cm. Smaller acute infarcts present within the right caudate nucleus and right external capsule. There are multiple additional small acute/subacute cortical and white matter infarcts within the right frontal lobe, the largest involving the right superior frontal gyrus. Moderate-sized acute/subacute infarct within the right temporal occipital lobe, measuring. 4.2 x 2.6 cm. There are multiple smaller acute/subacute infarcts more posteriorly within the right occipital and parietal lobes. Corresponding T2/FLAIR hyperintensity at these sites. Mild regional mass effect associated with the larger infarcts. No midline shift. Small amount of irregular SWI signal loss associated with the dominant right temporal occipital lobe infarct, likely reflecting petechial hemorrhage. Additional moderate scattered and confluent T2/FLAIR hyperintensity within the bilateral cerebral white matter consistent with chronic small vessel ischemic disease. Small chronic lacunar infarcts within the bilateral thalami. Cerebral volume is age appropriate. Vascular: Flow voids maintained within the proximal large arterial vessels. The distal right vertebral artery is non dominant. Skull and upper cervical spine: Normal marrow signal. Sinuses/Orbits: The imaged globes and orbits demonstrate no acute abnormality. Mild ethmoid sinus mucosal thickening. Small right maxillary sinus mucous retention cyst. No significant mastoid effusion. IMPRESSION: Moderate-sized acute/subacute infarct within the right temporal occipital lobe with associated mild  petechial hemorrhage. Numerous additional small right MCA vascular territory acute/subacute infarcts involving the right frontal, parietal and occipital lobes. Multiple acute/subacute infarcts also present within the right basal ganglia. Moderate chronic small vessel ischemic disease. Electronically Signed: By: Kellie Simmering On: 09/29/2018 11:01      Vas US Carotid Result Date: 09/30/2018 Carotid Arterial Duplex Study Indications:   CVA. Other Factors: Carotid stenosis by CTA. Performing Technologist: Toma Copier RVS  Examination Guidelines: A complete evaluation includes B-mode  imaging, spectral Doppler, color Doppler, and power Doppler as needed of all accessible portions of each vessel. Bilateral testing is considered an integral part of a complete examination. Limited examinations for reoccurring indications may be performed as noted.    Summary: Right Carotid: Velocities in the right ICA are consistent with a 40-59%                stenosis. Left Carotid: Velocities in the left ICA are consistent with a 1-39% stenosis. Vertebrals:  Bilateral vertebral arteries demonstrate antegrade flow. Subclavians: Normal flow hemodynamics were seen in bilateral subclavian              arteries. *See table(s) above for measurements and observations.     Preliminary      12-lead ECG SR All prior EKG's in EPIC reviewed with no documented atrial fibrillation  Telemetry SR, no arrhythmias Yesterday he had an [id number] second asystolic event associated with syncope occurring with carotid pressure/carotid US  Assessment and Plan:  1. Cryptogenic stroke The patient presents with cryptogenic stroke.  The patient has a TEE planned for this afternoon.  I spoke at length with the patient about monitoring for afib with either a 30 day event monitor or an implantable loop recorder.  Risks, benefits, and alteratives to implantable loop recorder were discussed with the patient today.   At this time, the patient is very  clear in his decision to proceed with implantable loop recorder.   He has some petechial hemorrhage with his stroke, if early AFib is noted Kailer Heindel need neurology input for timing of a/c  He had an 11.17second pause yesterday with his carotid US and had syncope He has no history of syncope, near syncope, or dizzy spells.  Given no history, I am not convinced he has indication for PPM.  I have discussed this with the patient, Kameka Whan review with Dr. Curt Bears.  For now, plan for loop.  I Taevion Sikora make TEE MD aware of event as well.  Wound care was reviewed with the patient (keep incision clean and dry for 3 days).  Wound check Bernita Beckstrom be  scheduled for the patient  Please call with questions.   Baldwin Jamaica, PA-C 10/01/2018  I have seen and examined this patient with Tommye Standard.  Agree with above, note added to reflect my findings.  On exam, RRR, no murmurs, lungs clear.  Patient presented to the hospital with cryptogenic stroke. To date, no cause has been found. TEE planned for today. If unrevealing, Princetta Uplinger plan for LINQ monitor to look for atrial fibrillation. Risks and benefits discussed. Risks include but not limited to bleeding and infection. The patient understands the risks and has agreed to the procedure.  Yajayra Feldt M. Tannis Burstein MD 10/01/2018 12:19 PM

## 2018-10-01 NOTE — Anesthesia Procedure Notes (Signed)
Procedure Name: MAC Date/Time: 10/01/2018 1:28 PM Performed by: Renato Shin, CRNA Pre-anesthesia Checklist: Patient identified, Emergency Drugs available, Suction available and Patient being monitored Oxygen Delivery Method: Simple face mask Preoxygenation: Pre-oxygenation with 100% oxygen Induction Type: IV induction Placement Confirmation: positive ETCO2 and breath sounds checked- equal and bilateral Dental Injury: Teeth and Oropharynx as per pre-operative assessment

## 2018-10-01 NOTE — Progress Notes (Signed)
Echocardiogram Echocardiogram Transesophageal has been performed.  Oneal Deputy Merrissa Giacobbe 10/01/2018, 1:59 PM

## 2018-10-01 NOTE — CV Procedure (Signed)
TEE  Patient sedated by anesthesia with propofol Throat numbed with viscous lidocaine Bite guard placed between teeath  TEE probe advanced to mid esophagus without difficulty  LA, LAA without masses NO significant valvular abnormalities No PFO by color doppler or as tested with injection of agtitate saline LVEF and RVEF are normal Mild fixed plaquing of thoracic aorta  Full report to follow  Procedure without complication  Dorris Carnes MD

## 2018-10-02 DIAGNOSIS — I63411 Cerebral infarction due to embolism of right middle cerebral artery: Principal | ICD-10-CM

## 2018-10-02 MED ORDER — ATORVASTATIN CALCIUM 40 MG PO TABS: 40.0000 mg | ORAL_TABLET | Freq: Every day | ORAL | 1 refills | Status: DC

## 2018-10-02 MED ORDER — CLOPIDOGREL BISULFATE 75 MG PO TABS: 75.0000 mg | ORAL_TABLET | Freq: Every day | ORAL | 0 refills | Status: AC

## 2018-10-02 MED ORDER — CLOPIDOGREL BISULFATE 75 MG PO TABS: 75.0000 mg | ORAL_TABLET | Freq: Every day | ORAL | 1 refills | Status: DC

## 2018-10-02 MED ORDER — ASPIRIN 81 MG PO CHEW: 81.0000 mg | CHEWABLE_TABLET | Freq: Every day | ORAL | 1 refills | Status: DC

## 2018-10-02 MED ORDER — POLYVINYL ALCOHOL 1.4 % OP SOLN
1.0000 [drp] | OPHTHALMIC | Status: DC | PRN
  Administered 2018-10-02: 1 [drp] via OPHTHALMIC
  Filled 2018-10-02: qty 15

## 2018-10-02 MED ORDER — ASPIRIN 81 MG PO CHEW: 81.0000 mg | CHEWABLE_TABLET | Freq: Every day | ORAL | 0 refills | Status: DC

## 2018-10-02 NOTE — Progress Notes (Addendum)
Pt IV removed, catheter intact and telemetry removed  Pt discharge education provided at bedside with pt and pt family  Pt has all belongings including sleep study equipment, stroke education packet and AVS Pt discharged via wheelchair with nurse staff

## 2018-10-02 NOTE — Discharge Summary (Signed)
Shaun Ball [id number] DOB: [birth year] DOA: 09/28/2018  PCP: Reynold Bowen, MD  Admit date: 09/28/2018 Discharge date: 10/02/2018  Admitted From: Home Disposition:  Home  Recommendations for Outpatient Follow-up:  1. Follow up with PCP in 1-2 weeks. Neurology will arrange outpatient follow up 2. New medications: aspirin 81 mg, plavix 75 mg x 21 days followed by aspirin alone per neurology recommendations 3. Please obtain CMP. 4. 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 Please follow up on the following pending results:  Home Health:Patient declined outpatient Speech Therapy Equipment/Devices: none  Discharge Condition:Stable CODE STATUS: FULL Diet recommendation: Heart Healthy  Brief/Interim Summary: History of present illness:  Shaun Ball is a 68 y.o. year old male with medical history significant for bladder cancer who presented on 09/28/2018 with right flank pain and back pain and confusion that started several days before admission and was found to have multifocal subacute ischemia throughout the right cerebral hemisphere. Transferred from [facility] to [facility] for MRI brain. Remaining hospital course addressed in problem based format below:   Hospital Course:    Subacute right cerebral stroke, multifocal hypodensities on CT head concerning for embolic event but cryptogenic etiology currently. MRI confirmed scattered right MCA infarcts. Initial concern for R ICA as source but CTA neck shows right cervical ICA less than 50%, carotid Dopplers confirmed.  Patient underwent TEE on 8/21, as well as loop recorder insertion for evaluation of potential arrhythmia to assess source of cryptogenic stroke.  To continue aspirin 81 mg and plavix 75 mg x 21 days, followed by aspirin alone per neurology. A1c 6, LDL 219, No source of embolus on TTE or TEE On  discharge patient declined speech therapy.   Sinus Pause. Occurred on 8/20 (11 seconds) during carotid ultrasound. No PPM indication per cardiology as he has no hisotry of syncope, near syncope or dizzy spells per cardiology. Loop monitor implanted for above problem    Hyperlipidemia.  LDL 219.  Tolerated Lipitor 40 mg.  Patient reports previous intolerance of Crestor ("whelps") in the past doing but no allergies here. ALT remained around 60 during admission. Advise monitoring as outpatient   Back pain, likely due to spinal stenosis based off CT abdomen obtained to rule out renal stone.  No neurologic deficits here, will benefit from continued outpatient work-up (MRI) on discharge with PCP, discussed with patient and his son, agreeable to plan.   Hypertension, stable.  Did not need aggressive BP control in hospital given stroke happened several days prior to admission. Continue home lisinopril   Hepatic steatosis.  Right upper quadrant ultrasound obtained for evaluation of ALT on admission confirmed. hepatitis panel unremarkable, no abdominal pain. ALT remained around 60 during admission   BPH, stable.  Continue Flomax   Sleep smart trial.  Patient undergoing CPAP mask tolerability tonight    Consultations:  Cardiology, Neurology  Procedures/Studies: TTE: 09/29/18 1. The right ventricle has normal systolc function.  2. No evidence of a thrombus present in the left atrial appendage.  3. No PFO by color doppler or as tested by injection of agitated saline  4. The aortic valve is tricuspid.  5. Mild fixed plaquing in thoracic aorta.  6. The left ventricle has normal systolic function, with an ejection fraction of 55-60%.  TEE: 10/01/18 IMPRESSIONS    1. The left ventricle has normal systolic function with an ejection fraction of 60-65%.  The cavity size was normal. Left ventricular diastolic parameters were normal.  2. The right ventricle has normal systolic function.  The cavity was normal. There is no increase in right ventricular wall thickness.  3. Mild thickening of the mitral valve leaflet.  4. The aortic valve is tricuspid. Mild thickening of the aortic valve. Sclerosis without any evidence of stenosis of the aortic valve.  5. The aorta is normal unless otherwise noted.  Loop recorder implantation :8/21 Subjective:  Discharge Exam: Vitals:   10/02/18 0831 10/02/18 1140  BP: (!) 143/73 (!) 142/81  Pulse: 70 70  Resp:  16  Temp:  97.7 F (36.5 C)  SpO2:  98%   Vitals:   10/01/18 2332 10/02/18 0300 10/02/18 0831 10/02/18 1140  BP: 125/61 (!) 124/56 (!) 143/73 (!) 142/81  Pulse: 70 65 70 70  Resp: 18 18  16   Temp: 97.8 F (36.6 C) 98 F (36.7 C)  97.7 F (36.5 C)  TempSrc: Oral Oral  Oral  SpO2: 95% 98%  98%  Weight:      Height:        General:  in bedside chair, no apparent distress Eyes: EOMI, anicteric ENT: Oral Mucosa clear and moist Cardiovascular: regular rate and rhythm, no murmurs, rubs or gallops, no edema, Respiratory: Normal respiratory effort on room air, lungs clear to auscultation bilaterally Abdomen: soft, non-distended, non-tender, normal bowel sounds Skin: No Rash Neurologic: Grossly no focal neuro deficit. Normal speech, symmetric face, normal strength throughoutMental status AAOx3, speech normal, Psychiatric:Appropriate affect, and mood  Discharge Diagnoses:  Principal Problem:   Rt cerebral stroke / multifocal subacute ischemia of Rt Cerebral Hemisphere Active Problems:   Essential hypertension   Abnormal liver function   Confusion    Discharge Instructions  Discharge Instructions    Diet - low sodium heart healthy   Complete by: As directed    Increase activity slowly   Complete by: As directed      Allergies as of 10/02/2018      Reactions   Statins Hives, Rash   whelps,liver enzymes elevated   Iohexol Other (See Comments)   Pt had sneezing after his CT with IV contrast, lasting only 10- 15  minutes, no intervention was needed, per radiologist      Medication List    STOP taking these medications   HYDROcodone-acetaminophen 5-325 MG tablet Commonly known as: NORCO/VICODIN     TAKE these medications   acetaminophen 500 MG tablet Commonly known as: TYLENOL Take 500 mg by mouth every 6 (six) hours as needed for moderate pain.   aspirin 81 MG chewable tablet Chew 1 tablet (81 mg total) by mouth daily. Start taking on: October 03, 2018   atorvastatin 40 MG tablet Commonly known as: LIPITOR Take 1 tablet (40 mg total) by mouth daily at 6 PM.   clopidogrel 75 MG tablet Commonly known as: PLAVIX Take 1 tablet (75 mg total) by mouth daily. Start taking on: October 03, 2018   fluticasone 50 MCG/ACT nasal spray Commonly known as: FLONASE Place 1 spray into both nostrils as needed for allergies or rhinitis.   lisinopril 40 MG tablet Commonly known as: ZESTRIL Take 40 mg by mouth every evening.   montelukast 10 MG tablet Commonly known as: SINGULAIR Take 10 mg by mouth daily as needed (allergies).   oxybutynin 5 MG tablet Commonly known as: DITROPAN Take 1 tablet (5 mg total) by mouth every 8 (eight) hours as needed for bladder spasms.   phenazopyridine 200  MG tablet Commonly known as: Pyridium Take 1 tablet (200 mg total) by mouth 3 (three) times daily as needed (for pain with urination).   sulfamethoxazole-trimethoprim 800-160 MG tablet Commonly known as: BACTRIM DS Take 1 tablet by mouth 2 (two) times daily. What changed: when to take this   tamsulosin 0.4 MG Caps capsule Commonly known as: FLOMAX Take 1 capsule (0.4 mg total) by mouth daily. What changed: when to take this      Follow-up Information    Baldwin Jamaica, PA-C Follow up.   Specialty: Cardiology Why: 10/11/2018 @ 10:15AM, wound check visit Contact information: [address] [place] [address] [phone]          Allergies  Allergen Reactions  . Statins Hives  and Rash    whelps,liver enzymes elevated  . Iohexol Other (See Comments)    Pt had sneezing after his CT with IV contrast, lasting only 10- 15 minutes, no intervention was needed, per radiologist        The results of significant diagnostics from this hospitalization (including imaging, microbiology, ancillary and laboratory) are listed below for reference.     Microbiology: Recent Results (from the past 240 hour(s))  Urine Culture     Status: None   Collection Time: 09/28/18  6:15 PM   Specimen: Urine, Random  Result Value Ref Range Status   Specimen Description   Final    URINE, RANDOM Performed at [facility] [address], [place], [place] [address]    Special Requests   Final    NONE Performed at [facility], [place] [address] [place], [place] [address]    Culture   Final    NO GROWTH Performed at [facility], [place] [address] [place], [place] [address]    Report Status 09/30/2018 FINAL  Final  SARS Coronavirus 2 [facility] order, Performed in [facility] hospital lab) Nasopharyngeal Nasopharyngeal Swab     Status: None   Collection Time: 09/28/18 11:16 PM   Specimen: Nasopharyngeal Swab  Result Value Ref Range Status   SARS Coronavirus 2 NEGATIVE NEGATIVE Final    Comment: (NOTE) If result is NEGATIVE SARS-CoV-2 target nucleic acids are NOT DETECTED. The SARS-CoV-2 RNA is generally detectable in upper and lower  respiratory specimens during the acute phase of infection. The lowest  concentration of SARS-CoV-2 viral copies this assay can detect is 250  copies / mL. A negative result does not preclude SARS-CoV-2 infection  and should not be used as the sole basis for treatment or other  patient management decisions.  A negative result may occur with  improper specimen collection / handling, submission of specimen other  than nasopharyngeal swab, presence of viral mutation(s) within the  areas targeted by this assay, and  inadequate number of viral copies  (<250 copies / mL). A negative result must be combined with clinical  observations, patient history, and epidemiological information. If result is POSITIVE SARS-CoV-2 target nucleic acids are DETECTED. The SARS-CoV-2 RNA is generally detectable in upper and lower  respiratory specimens dur ing the acute phase of infection.  Positive  results are indicative of active infection with SARS-CoV-2.  Clinical  correlation with patient history and other diagnostic information is  necessary to determine patient infection status.  Positive results do  not rule out bacterial infection or co-infection with other viruses. If result is PRESUMPTIVE POSTIVE SARS-CoV-2 nucleic acids MAY BE PRESENT.   A presumptive positive result was obtained  on the submitted specimen  and confirmed on repeat testing.  While 2019 novel coronavirus  (SARS-CoV-2) nucleic acids may be present in the submitted sample  additional confirmatory testing may be necessary for epidemiological  and / or clinical management purposes  to differentiate between  SARS-CoV-2 and other Sarbecovirus currently known to infect humans.  If clinically indicated additional testing with an alternate test  methodology [phone]) is advised. The SARS-CoV-2 RNA is generally  detectable in upper and lower respiratory sp ecimens during the acute  phase of infection. The expected result is Negative. Fact Sheet for Patients:  StrictlyIdeas.no Fact Sheet for Healthcare Providers: BankingDealers.co.za This test is not yet approved or cleared by the [place] FDA and has been authorized for detection and/or diagnosis of SARS-CoV-2 by FDA under an Emergency Use Authorization (EUA).  This EUA will remain in effect (meaning this test can be used) for the duration of the COVID-19 declaration under Section 564(b)(1) of the Act, 21 U.S.C. section 360bbb-3(b)(1), unless the  authorization is terminated or revoked sooner. Performed at [id number], [place] [address] [place], [place] [address]      Labs: BNP (last 3 results) No results for input(s): BNP in the last 8760 hours. Basic Metabolic Panel: Recent Labs  Lab 09/28/18 1818 09/29/18 0533 09/30/18 1004  NA 135 136 138  K 4.4 4.2 4.0  CL 101 106 105  CO2 23 26 25   GLUCOSE 110* 105* 167*  BUN 14 13 11   CREATININE 1.09 1.05 1.14  CALCIUM 9.6 8.8* 8.8*   Liver Function Tests: Recent Labs  Lab 09/28/18 1818 09/29/18 0533 09/30/18 1004  AST 38 33 36  ALT 69* 58* 61*  ALKPHOS 61 56 52  BILITOT 0.9 1.0 1.3*  PROT 8.1 7.2 6.5  ALBUMIN 4.6 3.9 3.6   Recent Labs  Lab 09/28/18 1818  LIPASE 34   No results for input(s): AMMONIA in the last 168 hours. CBC: Recent Labs  Lab 09/28/18 1818 09/29/18 0533  WBC 10.1 9.7  HGB 16.8 15.7  HCT 50.8 47.4  MCV 91.9 91.9  PLT 192 169   Cardiac Enzymes: Recent Labs  Lab 09/29/18 0533  CKTOTAL 52  CKMB 0.9   BNP: Invalid input(s): POCBNP CBG: No results for input(s): GLUCAP in the last 168 hours. D-Dimer No results for input(s): DDIMER in the last 72 hours. Hgb A1c No results for input(s): HGBA1C in the last 72 hours. Lipid Profile No results for input(s): CHOL, HDL, LDLCALC, TRIG, CHOLHDL, LDLDIRECT in the last 72 hours. Thyroid function studies No results for input(s): TSH, T4TOTAL, T3FREE, THYROIDAB in the last 72 hours.  Invalid input(s): FREET3 Anemia work up No results for input(s): VITAMINB12, FOLATE, FERRITIN, TIBC, IRON, RETICCTPCT in the last 72 hours. Urinalysis    Component Value Date/Time   COLORURINE AMBER (A) 09/28/2018 1945   APPEARANCEUR CLEAR 09/28/2018 1945   LABSPEC 1.010 09/28/2018 1945   PHURINE 6.0 09/28/2018 1945   GLUCOSEU NEGATIVE 09/28/2018 1945   HGBUR NEGATIVE 09/28/2018 1945   BILIRUBINUR NEGATIVE 09/28/2018 1945   KETONESUR NEGATIVE 09/28/2018 1945   PROTEINUR NEGATIVE  09/28/2018 1945   NITRITE POSITIVE (A) 09/28/2018 1945   LEUKOCYTESUR NEGATIVE 09/28/2018 1945   Sepsis Labs Invalid input(s): PROCALCITONIN,  WBC,  LACTICIDVEN Microbiology Recent Results (from the past 240 hour(s))  Urine Culture     Status: None   Collection Time: 09/28/18  6:15 PM   Specimen: Urine, Random  Result Value Ref Range Status   Specimen Description  Final    URINE, RANDOM Performed at [facility], [place] [address] [place], [place] [address]    Special Requests   Final    NONE Performed at [facility] [place] [address], [place], [place] [address]    Culture   Final    NO GROWTH Performed at [facility], [place] [address], [place], [place] [address]    Report Status 09/30/2018 FINAL  Final  SARS Coronavirus 2 [facility] order, Performed in [facility] hospital lab) Nasopharyngeal Nasopharyngeal Swab     Status: None   Collection Time: 09/28/18 11:16 PM   Specimen: Nasopharyngeal Swab  Result Value Ref Range Status   SARS Coronavirus 2 NEGATIVE NEGATIVE Final    Comment: (NOTE) If result is NEGATIVE SARS-CoV-2 target nucleic acids are NOT DETECTED. The SARS-CoV-2 RNA is generally detectable in upper and lower  respiratory specimens during the acute phase of infection. The lowest  concentration of SARS-CoV-2 viral copies this assay can detect is 250  copies / mL. A negative result does not preclude SARS-CoV-2 infection  and should not be used as the sole basis for treatment or other  patient management decisions.  A negative result may occur with  improper specimen collection / handling, submission of specimen other  than nasopharyngeal swab, presence of viral mutation(s) within the  areas targeted by this assay, and inadequate number of viral copies  (<250 copies / mL). A negative result must be combined with clinical  observations, patient history, and epidemiological information. If result is POSITIVE SARS-CoV-2  target nucleic acids are DETECTED. The SARS-CoV-2 RNA is generally detectable in upper and lower  respiratory specimens dur ing the acute phase of infection.  Positive  results are indicative of active infection with SARS-CoV-2.  Clinical  correlation with patient history and other diagnostic information is  necessary to determine patient infection status.  Positive results do  not rule out bacterial infection or co-infection with other viruses. If result is PRESUMPTIVE POSTIVE SARS-CoV-2 nucleic acids MAY BE PRESENT.   A presumptive positive result was obtained on the submitted specimen  and confirmed on repeat testing.  While 2019 novel coronavirus  (SARS-CoV-2) nucleic acids may be present in the submitted sample  additional confirmatory testing may be necessary for epidemiological  and / or clinical management purposes  to differentiate between  SARS-CoV-2 and other Sarbecovirus currently known to infect humans.  If clinically indicated additional testing with an alternate test  methodology [phone]) is advised. The SARS-CoV-2 RNA is generally  detectable in upper and lower respiratory sp ecimens during the acute  phase of infection. The expected result is Negative. Fact Sheet for Patients:  StrictlyIdeas.no Fact Sheet for Healthcare Providers: BankingDealers.co.za This test is not yet approved or cleared by the [place] FDA and has been authorized for detection and/or diagnosis of SARS-CoV-2 by FDA under an Emergency Use Authorization (EUA).  This EUA will remain in effect (meaning this test can be used) for the duration of the COVID-19 declaration under Section 564(b)(1) of the Act, 21 U.S.C. section 360bbb-3(b)(1), unless the authorization is terminated or revoked sooner. Performed at [facility], [place] [address], [place], [place] [address]      Time coordinating discharge: Over 30  minutes  SIGNED:   Desiree Hane, MD  [facility] 10/02/2018, 11:46 AM Pager   If 7PM-7AM, please contact night-coverage [email or web address] Password [email or web address]

## 2018-10-02 NOTE — Progress Notes (Signed)
STROKE TEAM PROGRESS NOTE   INTERVAL HISTORY Patient is sitting up comfortably in chair, wife at bedside. Pt no focal deficit, has loop placed and will be discharged today.   Vitals:   10/01/18 1700 10/01/18 1951 10/01/18 2332 10/02/18 0300  BP: 105/85 (!) 142/71 125/61 (!) 124/56  Pulse: (!) 101 79 70 65  Resp: 17 18 18 18   Temp: 98 F (36.7 C) 97.9 F (36.6 C) 97.8 F (36.6 C) 98 F (36.7 C)  TempSrc: Oral Oral Oral Oral  SpO2: 98% 100% 95% 98%  Weight:      Height:        CBC:  Recent Labs  Lab 09/28/18 1818 09/29/18 0533  WBC 10.1 9.7  HGB 16.8 15.7  HCT 50.8 47.4  MCV 91.9 91.9  PLT 192 [id number]    Basic Metabolic Panel:  Recent Labs  Lab 09/29/18 0533 09/30/18 1004  NA 136 138  K 4.2 4.0  CL 106 105  CO2 26 25  GLUCOSE 105* 167*  BUN 13 11  CREATININE 1.05 1.14  CALCIUM 8.8* 8.8*   Lipid Panel:     Component Value Date/Time   CHOL 289 (H) 09/29/2018 0533   TRIG 181 (H) 09/29/2018 0533   HDL 34 (L) 09/29/2018 0533   CHOLHDL 8.5 09/29/2018 0533   VLDL 36 09/29/2018 0533   LDLCALC 219 (H) 09/29/2018 0533   HgbA1c:  Lab Results  Component Value Date   HGBA1C 6.0 (H) 09/29/2018   Urine Drug Screen: No results found for: LABOPIA, COCAINSCRNUR, LABBENZ, AMPHETMU, THCU, LABBARB  Alcohol Level No results found for: [facility]  IMAGING Vas US Carotid  Result Date: 10/01/2018 Carotid Arterial Duplex Study Indications:   CVA. Other Factors: Carotid stenosis by CTA. Performing Technologist: Toma Copier RVS  Examination Guidelines: A complete evaluation includes B-mode imaging, spectral Doppler, color Doppler, and power Doppler as needed of all accessible portions of each vessel. Bilateral testing is considered an integral part of a complete examination. Limited examinations for reoccurring indications may be performed as noted.  Right Carotid Findings: +----------+--------+--------+--------+----------------------+-----------------+           PSV cm/sEDV  cm/sStenosisPlaque Description    Comments          +----------+--------+--------+--------+----------------------+-----------------+ CCA Prox  111     17                                                      +----------+--------+--------+--------+----------------------+-----------------+ CCA Distal105     23                                    mild imntimal                                                             wall changes      +----------+--------+--------+--------+----------------------+-----------------+ ICA Prox  153     45      40-59%  heterogenous          mild to moderate  palque on the far                                                         wall              +----------+--------+--------+--------+----------------------+-----------------+ ICA Mid   140     36                                                      +----------+--------+--------+--------+----------------------+-----------------+ ICA Distal119     38                                    tortuous          +----------+--------+--------+--------+----------------------+-----------------+ ECA       169     17              focal and heterogenousmild at the                                                               origin far wall   +----------+--------+--------+--------+----------------------+-----------------+ +----------+--------+-------+--------+-------------------+           PSV cm/sEDV cmsDescribeArm Pressure (mmHG) +----------+--------+-------+--------+-------------------+ Subclavian101                                        +----------+--------+-------+--------+-------------------+ +---------+--------+--+--------+-+ VertebralPSV cm/s46EDV cm/s6 +---------+--------+--+--------+-+   Left Carotid Findings: +----------+-------+--------+--------+----------------+-----------------------+            PSV    EDV cm/sStenosisPlaque          Comments                          cm/s                   Description                             +----------+-------+--------+--------+----------------+-----------------------+ CCA Prox  138    23                                                      +----------+-------+--------+--------+----------------+-----------------------+ CCA Distal122    21                              mild intimal wall  changes                 +----------+-------+--------+--------+----------------+-----------------------+ ICA Prox  122    22      1-39%   heterogenous    mild plaque             +----------+-------+--------+--------+----------------+-----------------------+ ICA Mid   100    20                              mild intimal wall                                                        changes                 +----------+-------+--------+--------+----------------+-----------------------+ ICA Distal90     24                              tortuous                +----------+-------+--------+--------+----------------+-----------------------+ ECA       182    24                              Mild intimal wall                                                        changes                 +----------+-------+--------+--------+----------------+-----------------------+ +----------+--------+--------+--------+-------------------+ SubclavianPSV cm/sEDV cm/sDescribeArm Pressure (mmHG) +----------+--------+--------+--------+-------------------+           249                                         +----------+--------+--------+--------+-------------------+ +---------+--------+--+--------+--+ VertebralPSV cm/s63EDV cm/s10 +---------+--------+--+--------+--+   Summary: Right Carotid: Velocities in the right ICA are consistent with a 40-59%                stenosis. Left  Carotid: Velocities in the left ICA are consistent with a 1-39% stenosis. Vertebrals:  Bilateral vertebral arteries demonstrate antegrade flow. Subclavians: Normal flow hemodynamics were seen in bilateral subclavian              arteries. *See table(s) above for measurements and observations.  Electronically signed by Antony Contras MD on 10/01/2018 at 3:36:28 PM.    Final     PHYSICAL EXAM Pleasant middle-aged Caucasian male not in distress. . Afebrile. Head is nontraumatic. Neck is supple without bruit.    Cardiac exam no murmur or gallop. Lungs are clear to auscultation. Distal pulses are well felt.  Neurological Exam  Awake  Alert oriented x 3. Normal speech and language, except slight difficulty repeat complicated sentences. Registration 3/3, delayed recall 1/3. Eye movements full without nystagmus. fundi were not visualized. Visual field full. Hearing is normal. Palatal movements are normal. Face symmetric. Tongue protrusion towards left. Normal strength, tone, reflexes and coordination. Normal sensation.  Gait deferred.  ASSESSMENT/PLAN Mr. Shaun Ball is a 68 y.o. male with history of HTN presenting with confusion, forgetful, trouble using cell phone.   Stroke:  Scattered right MCA infarcts,  embolic pattern secondary to unknown source - possible R ICA nonstenotic athero, but can not rule out cardioembolic source  CTA head Intracranial atherosclerosis. Mild/mod narrowing R VA, BA and B ICAs. Parenchymal hypodensities R cerebral cortex suspicious for infarcts   CTA neck prox R cervical ICA < 50%  MRI  Moderate acute/subacte R temporal occipital lobe infarct w/ mild petechial hemorrhage. Numerous small R MCA infarcts (frontal, parietal, occipital lobes) and R basal ganglia.   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 No source of embolus   TEE unremarkable, no PFO  Loop recorder placed.   LDL 219  HgbA1c 6.0  SCDs for VTE prophylaxis  No antithrombotic  prior to admission, now on aspirin 81 mg and plavix 75 mg daily x 3 weeks, then aspirin alone.  Therapy recommendations:  No PT, no OT, OP SLP  Disposition:  Return home w/ OP SLP  Right ICA nonstenotic athero  CTA neck prox R cervical ICA < 50%  Carotid Doppler right ICA 40-59% stenosis  Could be potentially stroke source, recommend outpt VVS follow up  Hypertension  Stable . Permissive hypertension (OK if < 220/120) but gradually normalize in 3-5 days . Long-term BP goal normotensive  Hyperlipidemia  Home meds:  No statin  Hx statin intolerance  started on lipitor 40 and zetia 10  LDL 219, goal < 70  Continue statin and zetia at discharge  Other Stroke Risk Factors  Advanced age  Former Cigarette smoker, quit 18 yrs ago  advised to drink no more than 2 drink(s) a day.   Other Active Problems  Back pain  Hx bladder cancer  Hospital day # 4  Neurology will sign off. Please call with questions. Pt will follow up with stroke clinic NP at [facility] in about 4 weeks. Thanks for the consult.  Rosalin Hawking, MD PhD Stroke Neurology 10/02/2018 12:04 PM  To contact Stroke Continuity provider, please refer to [email or web address] After hours, contact General Neurology

## 2018-10-02 NOTE — Discharge Instructions (Signed)
Post implant wound care instructions Keep incision clean and dry for 3 days. You can remove outer dressing tomorrow. Leave steri-strips (little pieces of tape) on until seen in the office for wound check appointment. Call the office [phone]) for redness, drainage, swelling, or fever.  Take aspirin 81 mg and plavix 75 mg daily for 3 weeks followed by aspirin alone Take atorvastatin 40 mg every daily

## 2018-10-02 NOTE — Care Management (Signed)
Spoke with patient about outpatient ST. He repeated stated "I don't think it's beneficial." He refused referral. No other CM needs

## 2018-10-04 ENCOUNTER — Encounter (HOSPITAL_COMMUNITY): Payer: Self-pay | Admitting: Cardiology

## 2018-10-05 ENCOUNTER — Other Ambulatory Visit: Payer: Self-pay | Admitting: *Deleted

## 2018-10-05 NOTE — Patient Outreach (Signed)
[facility]) Care Management  10/05/2018  JWAUN VANZANTE 1950/04/02 [id number]   EMMI- stroke- d/c from [place] Day # 1 Date: Monday 10/04/18 1303  Red Alert Reason: Know how/when to take meds? No   Insurance: [place] admissions x 2  ED visits x 0 in the last 6 months  Last admission with d/c- 09/28/18 to 10/02/18 for Subacute right cerebral stroke, multifocal hypodensities on CT head concerning for embolic event but cryptogenic etiology currently He had right flank pain and back pain and confusion that started several days before admission   Transition of care services noted to be completed by primary care MD office staff- [facility]  Outreach attempt # 1 No answer. [facility] RN CM left HIPAA compliant voicemail message along with CM's contact info.   Plan: [facility] RN CM sent an unsuccessful outreach letter and scheduled this patient for another call attempt within 4 business days  Kimberly L. Lavina Hamman, RN, BSN, [place] Coordinator Office number [phone] Mobile number [phone]  Main [facility] number [phone] Fax number [phone]

## 2018-10-06 ENCOUNTER — Encounter: Payer: Self-pay | Admitting: *Deleted

## 2018-10-06 ENCOUNTER — Other Ambulatory Visit: Payer: Self-pay

## 2018-10-06 ENCOUNTER — Other Ambulatory Visit: Payer: Self-pay | Admitting: *Deleted

## 2018-10-06 NOTE — Patient Outreach (Signed)
[facility]) Care Management  10/06/2018  Shaun Ball 1950/12/28 [id number]   EMMI- stroke- d/c from [place] Day # 1 Date: Monday 10/04/18 1303  Red Alert Reason: Know how/when to take meds? No   Insurance: [place] admissions x 2  ED visits x 0 in the last 6 months  Last admission with d/c- 09/28/18 to 10/02/18 for Subacute right cerebral stroke, multifocal hypodensities on CT head concerning forembolic event but cryptogenic etiology currently He had right flank pain and back pain and confusion that started several days before admission- Home health services that was offered at hospital was refused by the patient   Transition of care services noted to be completed by primary care MD office staff- [facility] Shaun Ball confirms his primary care office called him and 10/05/18 [facility] RN CM did ask some Transition of care questions during this assessment and also encouraged him to expect a call from MD office also for this process He voiced understanding  Outreach attempt # 1 to his mobile number unsuccessful [facility] RN CM left a HIPAA compliant voice message Call attempt to the home number was successful today  Shaun Ball answered Patient is able to verify HIPAA, DOB and Address Reviewed and addressed EMMI red alert call to [name] with patient  EMMI  Know how/when to take meds? No - Shaun Ball reports the answer No is incorrect He states he "said no but should have said yes" He denies issues with knowing how and when to take his medicines [facility] RN CM reviewed the listed changes in his medication listed on the after visit d/c summary from [facility]. He has his aspirin, Plavix and Lipitor. RN CM discussed his change from aspirin and Plavix to only aspirin after 21 days He voiced understanding  He declines any medical concerns today nor any needs of assist from [place] when offered He reports feeling much  better  Social: Shaun Ball a 68 year old retired legally separated male He lives at home and has the assistance of his son and Margreta Journey He is independent/assist with care needs and transportation. He denies issues with transportation to medical appointments. [facility] RN CM reviewed [facility] SW services for alternate transportation resources but he denied need of services at this time He was encouraged to call CM if services needed at a later time Today he is enjoying the company of his one year old grand daughter and her mother during the time of this call  Conditions  subacute right cerebral stroke, multifocal hypodensities on CT head concerning forembolic event but cryptogenic etiology currently, bladder cancer, sinus pause, HLD, back pain r/t spinal stenosis, HTN. hepatitis steatosis, BPH  DME: eyeglasses   Medications: He denies concerns with taking medications as prescribed, affording medications, side effects of medications and questions about medications  Appointments:  Pt reports no contact from [place] neurology office at this time  [place] CM called the office [phone] and left a message for referral with request to call the pt or CM (left Cm and Pt numbers)   10/11/18 1015 scheduled to be seen at the cardiologist on related to "chip put in"  [place], [place]   10/26/18 0805 cardiac remote pacer check   Advance Directives: Denies need for assist with advance directives [facility] RN CM discussed the availability of assistance from [facility] SW for advance directives and encouraged him to  call if assist is needed in the future   Consent: [facility] RN CM reviewed [facility] services with patient. Patient gave verbal consent for [facility] telephonic RN CM services.   Advised patient that there will be further automated EMMI- post discharge calls to assess how the patient is doing following the recent hospitalization Advised the patient that another call may be received from a nurse if any of their responses were  abnormal. Patient voiced understanding and was appreciative of f/u call.   [facility] RN CM will close case at this time as patient has been assessed and no needs identified/needs resolved.   Pt encouraged to return a call to [place] CM prn  [facility] RN CM discussed the outreach letter already sent on 10/05/18 with [facility] brochure enclosed for review  Routed note to MDs  Joelene Millin L. Lavina Hamman, RN, BSN, [place] Coordinator Office number [phone] Mobile number [phone]  Main [facility] number [phone] Fax number [phone]

## 2018-10-10 NOTE — Progress Notes (Addendum)
Cardiology Office Note Date:  10/11/2018  Patient ID:  Shaun Ball 1950-04-23, MRN [id number] PCP:  Reynold Bowen, MD  Electrophysiologist:  Dr. Curt Bears   Chief Complaint:   ILR site check  History of Present Illness: Shaun Ball is a 68 y.o. male with history of HTN, bladder cancer, most recently a cryptogenic stroke, Aug 2020 and underwent ILR implant.  He has healed well without site concerns or issues.  He is still pending his PMD and neurology f/u.  No CP< palpitations, no SOB, near syncope or syncope.  No known cardiac history  Device information: MDT ILR, implanted 10/01/2018, cryptogenic stroke, Dr. Curt Bears   Past Medical History:  Diagnosis Date  . Arthritis   . Bladder cancer ([place])   . Dysuria   . History of kidney stones 2012   passed / has renal cyst to evaluated at later time  . Hypertension   . Lesion of bladder   . Pneumonia    hx of   . PONV (postoperative nausea and vomiting)    10 yrs ago  no problems with last 2 surgeries  . Wears glasses     Past Surgical History:  Procedure Laterality Date  . BUBBLE STUDY  10/01/2018   Procedure: BUBBLE STUDY;  Surgeon: Fay Records, MD;  Location: [place];  Service: Cardiovascular;;  . CYSTOSCOPY N/A 11/05/2016   Procedure: Consuela Mimes;  Surgeon: Ceasar Mons, MD;  Location: [facility];  Service: Urology;  Laterality: N/A;  . CYSTOSCOPY WITH BIOPSY Bilateral 10/24/2016   Procedure: CYSTOSCOPY WITH BLADDER BIOPSY/ BILATERAL RETROGRADE [id number];  Surgeon: Ceasar Mons, MD;  Location: [facility];  Service: Urology;  Laterality: Bilateral;  . CYSTOSCOPY WITH BIOPSY N/A 05/12/2018   Procedure: CYSTOSCOPY WITH BLADDER BIOPSY/ BILATERAL RETROGRADE PYELOGRAM BIOPSY;  Surgeon: Ceasar Mons, MD;  Location: [facility];  Service: Urology;  Laterality: N/A;  . 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 left 03-25-2006;  right  07-07-2006   and Gastroc Slide  . fattytumorremoved from back     . LOOP RECORDER INSERTION N/A 10/01/2018   Procedure: LOOP RECORDER INSERTION;  Surgeon: Constance Haw, MD;  Location: [place] [facility];  Service: Cardiovascular;  Laterality: N/A;  . TEE WITHOUT CARDIOVERSION N/A 10/01/2018   Procedure: TRANSESOPHAGEAL ECHOCARDIOGRAM (TEE);  Surgeon: Fay Records, MD;  Location: [place];  Service: Cardiovascular;  Laterality: N/A;  . TRANSURETHRAL RESECTION OF BLADDER TUMOR N/A 11/05/2016   Procedure: TRANSURETHRAL RESECTION OF BLADDER TUMOR (TURBT);  Surgeon: Ceasar Mons, MD;  Location: [facility];  Service: Urology;  Laterality: N/A;    Current Outpatient Medications  Medication Sig Dispense Refill  . acetaminophen (TYLENOL) 500 MG tablet Take 500 mg by mouth every 6 (six) hours as needed for moderate pain.    Marland Kitchen aspirin 81 MG chewable tablet Chew 1 tablet (81 mg total) by mouth daily. 60 tablet 1  . atorvastatin (LIPITOR) 40 MG tablet Take 1 tablet (40 mg total) by mouth daily at 6 PM. 60 tablet 1  . clopidogrel (PLAVIX) 75 MG tablet Take 1 tablet (75 mg total) by mouth daily for 21 days. 21 tablet 0  . fluticasone (FLONASE) 50 MCG/ACT nasal spray Place 1 spray into both nostrils as needed for allergies or rhinitis.    Marland Kitchen lisinopril (PRINIVIL,ZESTRIL) 40 MG tablet Take 40 mg by mouth every evening.     . Melatonin 5 MG TABS Take 5 mg by mouth  at bedtime.    . montelukast (SINGULAIR) 10 MG tablet Take 10 mg by mouth daily as needed (allergies).   6  . oxybutynin (DITROPAN) 5 MG tablet Take 1 tablet (5 mg total) by mouth every 8 (eight) hours as needed for bladder spasms. 30 tablet 0  . phenazopyridine (PYRIDIUM) 200 MG tablet Take 1 tablet (200 mg total) by mouth 3 (three) times daily as needed (for pain with urination). 30 tablet 0  . tamsulosin (FLOMAX) 0.4 MG CAPS capsule Take 1 capsule (0.4 mg total) by mouth daily. (Patient taking differently: Take  0.4 mg by mouth daily after breakfast. ) 30 capsule 11   No current facility-administered medications for this visit.     Allergies:   Statins and Iohexol   Social History:  The patient  reports that he quit smoking about 18 years ago. His smoking use included cigarettes. He quit after 28.00 years of use. He has never used smokeless tobacco. He reports current alcohol use of about 7.0 standard drinks of alcohol per week. He reports that he does not use drugs.   Family History:  The patient's family history includes Blindness in his maternal uncle and mother; Cataracts in his father and mother; Diabetes in his brother, father, mother, and sister; Glaucoma in his mother; Macular degeneration in his maternal uncle, mother, and paternal aunt.  ROS:  Please see the history of present illness.  All other systems are reviewed and otherwise negative.   PHYSICAL EXAM:  VS:  BP 128/68   Pulse 74   Ht 5\' 10"  (1.778 m)   Wt 182 lb (82.6 kg)   BMI 26.11 kg/m  BMI: Body mass index is 26.11 kg/m. Well nourished, well developed, in no acute distress  HEENT: normocephalic, atraumatic  Neck: no JVD, carotid bruits or masses Cardiac:  RRR; no significant murmurs, no rubs, or gallops Lungs:  CTA b/, no wheezing, rhonchi or rales  Abd: soft, nontender MS: no deformity or atrophy Ext: no edema  Skin: warm and dry, no rash Neuro:  No gross deficits appreciated Psych: euthymic mood, full affect  ILR site is stable,iswell healed, no erythema, edema or increased heat to the surrounding tissues   EKG: not done today ILR interrogation done today and reviewed by myself: in SR today, no device observations, battery is good  Recent Labs: 09/29/2018: Hemoglobin 15.7; Platelets 169; TSH 1.190 09/30/2018: ALT 61; BUN 11; Creatinine, Ser 1.14; Potassium 4.0; Sodium 138  09/29/2018: Cholesterol 289; HDL 34; LDL Cholesterol 219; Total CHOL/HDL Ratio 8.5; Triglycerides 181; VLDL 36   Estimated Creatinine  Clearance: 63.1 mL/min (by C-G formula based on SCr of 1.14 mg/dL).   Wt Readings from Last 3 Encounters:  10/11/18 182 lb (82.6 kg)  09/30/18 185 lb 3 oz (84 kg)  05/12/18 186 lb (84.4 kg)     Other studies reviewed: Additional studies/records reviewed today include: summarized above  ASSESSMENT AND PLAN:  1. ILR     Site is well healed, no evidence of infection     Discussed loop monitoring, he was unable to use APP, has a traditional transmitter at home.    Disposition: monthly loop remotes, f/u with [place] PRN otherwise   Current medicines are reviewed at length with the patient today.  The patient did not have any concerns regarding medicines.  Venetia Night, PA-C 10/11/2018 11:44 AM     [facility] [address] [place] [address] [phone] (office)  [phone] (fax)

## 2018-10-11 ENCOUNTER — Ambulatory Visit (INDEPENDENT_AMBULATORY_CARE_PROVIDER_SITE_OTHER): Payer: Medicare Other | Admitting: Physician Assistant

## 2018-10-11 ENCOUNTER — Other Ambulatory Visit: Payer: Self-pay

## 2018-10-11 ENCOUNTER — Encounter: Payer: Self-pay | Admitting: Physician Assistant

## 2018-10-11 VITALS — BP 128/68 | HR 74 | Ht 70.0 in | Wt 182.0 lb

## 2018-10-11 DIAGNOSIS — Z5189 Encounter for other specified aftercare: Secondary | ICD-10-CM

## 2018-10-11 DIAGNOSIS — Z4509 Encounter for adjustment and management of other cardiac device: Secondary | ICD-10-CM

## 2018-10-11 DIAGNOSIS — I639 Cerebral infarction, unspecified: Secondary | ICD-10-CM | POA: Diagnosis not present

## 2018-10-11 NOTE — Anesthesia Postprocedure Evaluation (Signed)
Anesthesia Post Note  Patient: Shaun Ball  Procedure(s) Performed: TRANSESOPHAGEAL ECHOCARDIOGRAM (TEE) (N/A ) BUBBLE STUDY     Patient location during evaluation: PACU Anesthesia Type: MAC Level of consciousness: awake and alert Pain management: pain level controlled Vital Signs Assessment: post-procedure vital signs reviewed and stable Respiratory status: spontaneous breathing, nonlabored ventilation, respiratory function stable and patient connected to nasal cannula oxygen Cardiovascular status: stable and blood pressure returned to baseline Postop Assessment: no apparent nausea or vomiting Anesthetic complications: no    Last Vitals:  Vitals:   10/02/18 0831 10/02/18 1140  BP: (!) 143/73 (!) 142/81  Pulse: 70 70  Resp:  16  Temp:  36.5 C  SpO2:  98%    Last Pain:  Vitals:   10/02/18 1140  TempSrc: Oral  PainSc:                  Angus S

## 2018-10-11 NOTE — Patient Instructions (Signed)
Medication Instructions:  Your physician recommends that you continue on your current medications as directed. Please refer to the Current Medication list given to you today.  If you need a refill on your cardiac medications before your next appointment, please call your pharmacy.   Lab work: NONE ORDERED  TODAY   If you have labs (blood work) drawn today and your tests are completely normal, you will receive your results only by: Marland Kitchen MyChart Message (if you have MyChart) OR . A paper copy in the mail If you have any lab test that is abnormal or we need to change your treatment, we will call you to review the results.  Testing/Procedures: NONE ORDERED  TODAY   Follow-Up: At [facility], you and your health needs are our priority.  As part of our continuing mission to provide you with exceptional heart care, we have created designated Provider Care Teams.  These Care Teams include your primary Cardiologist (physician) and Advanced Practice Providers (APPs -  Physician Assistants and Nurse Practitioners) who all work together to provide you with the care you need, when you need it. CONTACT [facility] [phone] AS NEEDED FOR  ANY CARDIAC RELATED SYMPTOMS   Any Other Special Instructions Will Be Listed Below (If Applicable).

## 2018-10-13 DIAGNOSIS — E7849 Other hyperlipidemia: Secondary | ICD-10-CM | POA: Diagnosis not present

## 2018-10-13 DIAGNOSIS — I639 Cerebral infarction, unspecified: Secondary | ICD-10-CM | POA: Diagnosis not present

## 2018-10-22 ENCOUNTER — Encounter (HOSPITAL_COMMUNITY): Payer: Self-pay | Admitting: Internal Medicine

## 2018-10-22 ENCOUNTER — Other Ambulatory Visit: Payer: Self-pay | Admitting: Medical

## 2018-10-22 ENCOUNTER — Emergency Department (HOSPITAL_COMMUNITY): Payer: Medicare Other

## 2018-10-22 ENCOUNTER — Observation Stay (HOSPITAL_COMMUNITY)
Admission: EM | Admit: 2018-10-22 | Discharge: 2018-10-22 | Disposition: A | Discharge status: Home / Self Care | Payer: Medicare Other | Attending: Internal Medicine | Admitting: Internal Medicine

## 2018-10-22 ENCOUNTER — Other Ambulatory Visit: Payer: Self-pay

## 2018-10-22 DIAGNOSIS — Z20828 Contact with and (suspected) exposure to other viral communicable diseases: Secondary | ICD-10-CM | POA: Insufficient documentation

## 2018-10-22 DIAGNOSIS — R079 Chest pain, unspecified: Secondary | ICD-10-CM

## 2018-10-22 DIAGNOSIS — Z7951 Long term (current) use of inhaled steroids: Secondary | ICD-10-CM | POA: Diagnosis not present

## 2018-10-22 DIAGNOSIS — I700 Atherosclerosis of aorta: Secondary | ICD-10-CM | POA: Diagnosis not present

## 2018-10-22 DIAGNOSIS — Z8551 Personal history of malignant neoplasm of bladder: Secondary | ICD-10-CM | POA: Insufficient documentation

## 2018-10-22 DIAGNOSIS — C679 Malignant neoplasm of bladder, unspecified: Secondary | ICD-10-CM | POA: Diagnosis present

## 2018-10-22 DIAGNOSIS — I10 Essential (primary) hypertension: Secondary | ICD-10-CM | POA: Insufficient documentation

## 2018-10-22 DIAGNOSIS — R0602 Shortness of breath: Secondary | ICD-10-CM | POA: Diagnosis not present

## 2018-10-22 DIAGNOSIS — E785 Hyperlipidemia, unspecified: Secondary | ICD-10-CM | POA: Diagnosis not present

## 2018-10-22 DIAGNOSIS — Z87442 Personal history of urinary calculi: Secondary | ICD-10-CM | POA: Insufficient documentation

## 2018-10-22 DIAGNOSIS — Z79899 Other long term (current) drug therapy: Secondary | ICD-10-CM | POA: Insufficient documentation

## 2018-10-22 DIAGNOSIS — M1990 Unspecified osteoarthritis, unspecified site: Secondary | ICD-10-CM | POA: Diagnosis not present

## 2018-10-22 DIAGNOSIS — R0789 Other chest pain: Principal | ICD-10-CM | POA: Insufficient documentation

## 2018-10-22 DIAGNOSIS — Z8673 Personal history of transient ischemic attack (TIA), and cerebral infarction without residual deficits: Secondary | ICD-10-CM | POA: Insufficient documentation

## 2018-10-22 DIAGNOSIS — Z87891 Personal history of nicotine dependence: Secondary | ICD-10-CM | POA: Insufficient documentation

## 2018-10-22 DIAGNOSIS — Z7902 Long term (current) use of antithrombotics/antiplatelets: Secondary | ICD-10-CM | POA: Diagnosis not present

## 2018-10-22 DIAGNOSIS — Z7982 Long term (current) use of aspirin: Secondary | ICD-10-CM | POA: Diagnosis not present

## 2018-10-22 LAB — BASIC METABOLIC PANEL
Anion gap: 9 (ref 5–15)
BUN: 18 mg/dL (ref 8–23)
CO2: 25 mmol/L (ref 22–32)
Calcium: 9.7 mg/dL (ref 8.9–10.3)
Chloride: 102 mmol/L (ref 98–111)
Creatinine, Ser: 1.08 mg/dL (ref 0.61–1.24)
GFR calc Af Amer: >60 mL/min (ref >60)
GFR calc non Af Amer: >60 mL/min (ref >60)
Glucose, Bld: 103 mg/dL — ABNORMAL HIGH (ref 70–99)
Potassium: 4.5 mmol/L (ref 3.5–5.1)
Sodium: 136 mmol/L (ref 135–145)

## 2018-10-22 LAB — RAPID URINE DRUG SCREEN, HOSP PERFORMED
Amphetamines: NOT DETECTED
Barbiturates: NOT DETECTED
Benzodiazepines: NOT DETECTED
Cocaine: NOT DETECTED
Opiates: NOT DETECTED
Tetrahydrocannabinol: NOT DETECTED

## 2018-10-22 LAB — SARS CORONAVIRUS 2 (TAT 6-24 HRS): SARS Coronavirus 2: NEGATIVE

## 2018-10-22 LAB — CBC
HCT: 49.6 % (ref 39.0–52.0)
Hemoglobin: 16.3 g/dL (ref 13.0–17.0)
MCH: 30.1 pg (ref 26.0–34.0)
MCHC: 32.9 g/dL (ref 30.0–36.0)
MCV: 91.5 fL (ref 80.0–100.0)
Platelets: 202 10*3/uL (ref 150–400)
RBC: 5.42 MIL/uL (ref 4.22–5.81)
RDW: 12.6 % (ref 11.5–15.5)
WBC: 6.6 10*3/uL (ref 4.0–10.5)
nRBC: 0 % (ref 0.0–0.2)

## 2018-10-22 LAB — TROPONIN I (HIGH SENSITIVITY)
Troponin I (High Sensitivity): 4 ng/L (ref <18)
Troponin I (High Sensitivity): 3 ng/L (ref <18)

## 2018-10-22 MED ORDER — ACETAMINOPHEN 325 MG PO TABS: 650.0000 mg | ORAL_TABLET | ORAL | Status: DC | PRN

## 2018-10-22 MED ORDER — ASPIRIN 81 MG PO CHEW: 81.0000 mg | CHEWABLE_TABLET | Freq: Every day | ORAL | Status: DC

## 2018-10-22 MED ORDER — ONDANSETRON HCL 4 MG/2ML IJ SOLN: 4.0000 mg | Freq: Four times a day (QID) | INTRAMUSCULAR | Status: DC | PRN

## 2018-10-22 MED ORDER — SODIUM CHLORIDE 0.9% FLUSH: 3.0000 mL | Freq: Once | INTRAVENOUS | Status: DC

## 2018-10-22 MED ORDER — ENOXAPARIN SODIUM 40 MG/0.4ML ~~LOC~~ SOLN
40.0000 mg | SUBCUTANEOUS | Status: DC
  Filled 2018-10-22: qty 0.4

## 2018-10-22 MED ORDER — MELATONIN 5 MG PO TABS: 5.0000 mg | ORAL_TABLET | Freq: Every day | ORAL | Status: DC

## 2018-10-22 MED ORDER — CLOPIDOGREL BISULFATE 75 MG PO TABS: 75.0000 mg | ORAL_TABLET | Freq: Every day | ORAL | Status: DC

## 2018-10-22 MED ORDER — ASPIRIN 81 MG PO CHEW
243.0000 mg | CHEWABLE_TABLET | Freq: Once | ORAL | Status: AC
  Administered 2018-10-22: 243 mg via ORAL
  Filled 2018-10-22: qty 3

## 2018-10-22 MED ORDER — TAMSULOSIN HCL 0.4 MG PO CAPS: 0.4000 mg | ORAL_CAPSULE | Freq: Every day | ORAL | Status: DC

## 2018-10-22 NOTE — ED Triage Notes (Addendum)
Pt reports reports light CP in the left chest since having a stroke on August 18th. Pt reports light headed, stabbing sensation in the chest this morning worse than prior pains. Reports this mornings symptoms somewhat improved after aspirin and plavix this morning. Pain score 0/10 currently. No hx of heart attack. Family hx of MI, high choesterol.

## 2018-10-22 NOTE — ED Provider Notes (Signed)
[facility] EMERGENCY DEPARTMENT Provider Note   CSN: [id number] Arrival date & time: 10/22/18  [id number]     History   Chief Complaint Chief Complaint  Patient presents with   Chest Pain    HPI Shaun Ball is a 69 y.o. 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  Patient reports a central/left-sided squeezing pressure sensation radiating to his left arm that lasts approximately 2-3 minutes, occurred at 7 AM this morning after waking up while still in bed patient reports he has had this problem before but it is typically more mild and brief.  He reports pain today was severe with associated lightheadedness and improved following taking morning dose of 1 baby aspirin and Plavix.  Patient denies fever/chills, cough/shortness of breath, headache/vision changes, numbness/weakness, tingling, abdominal pain, nausea/vomiting or any additional concerns.     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 09/28/2018    Past Surgical History:  Procedure Laterality Date   BUBBLE STUDY  10/01/2018   Procedure: BUBBLE STUDY;  Surgeon: Fay Records, MD;  Location: [place] [facility];  Service: Cardiovascular;;   CYSTOSCOPY N/A 11/05/2016   Procedure: Consuela Mimes;  Surgeon: Ceasar Mons, MD;  Location: [facility];  Service: Urology;  Laterality: N/A;   CYSTOSCOPY WITH BIOPSY Bilateral 10/24/2016   Procedure:  CYSTOSCOPY WITH BLADDER BIOPSY/ BILATERAL RETROGRADE [id number];  Surgeon: Ceasar Mons, MD;  Location: [facility];  Service: Urology;  Laterality: Bilateral;   CYSTOSCOPY WITH BIOPSY N/A 05/12/2018   Procedure: CYSTOSCOPY WITH BLADDER BIOPSY/ BILATERAL RETROGRADE PYELOGRAM BIOPSY;  Surgeon: Ceasar Mons, MD;  Location: [facility];  Service: Urology;  Laterality: N/A;   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 left 03-25-2006;  right 07-07-2006   and Gastroc Slide   fattytumorremoved from back      [place] N/A 10/01/2018   Procedure: [place];  Surgeon: Constance Haw, MD;  Location: [facility];  Service: Cardiovascular;  Laterality: N/A;   TEE WITHOUT CARDIOVERSION N/A 10/01/2018   Procedure: TRANSESOPHAGEAL ECHOCARDIOGRAM (TEE);  Surgeon: Fay Records, MD;  Location: [facility];  Service: Cardiovascular;  Laterality: N/A;   TRANSURETHRAL RESECTION OF BLADDER TUMOR N/A 11/05/2016   Procedure: TRANSURETHRAL RESECTION OF BLADDER TUMOR (TURBT);  Surgeon: Ceasar Mons, MD;  Location: [facility];  Service: Urology;  Laterality: N/A;        Home Medications    Prior to Admission medications   Medication Sig Start Date End Date Taking? Authorizing Provider  acetaminophen (TYLENOL) 500 MG tablet Take 500 mg by mouth every 6 (six) hours as needed for moderate pain.   Yes [provider]  aspirin 81 MG chewable tablet Chew 1 tablet (81 mg total) by mouth daily. 10/03/18  Yes Oretha Milch D, MD  clopidogrel (PLAVIX) 75 MG tablet Take 1 tablet (  75 mg total) by mouth daily for 21 days. 10/03/18 10/24/18 Yes Desiree Hane, MD  fluticasone (FLONASE) 50 MCG/ACT nasal spray Place 1 spray into both nostrils as needed for allergies or rhinitis.   Yes [provider]  lisinopril (PRINIVIL,ZESTRIL) 40 MG tablet Take 40 mg by mouth every evening.    Yes  [provider]  Melatonin 5 MG TABS Take 5 mg by mouth at bedtime.   Yes [provider]  montelukast (SINGULAIR) 10 MG tablet Take 10 mg by mouth daily as needed (allergies).  07/14/17  Yes [provider]  tamsulosin (FLOMAX) 0.4 MG CAPS capsule Take 1 capsule (0.4 mg total) by mouth daily. Patient taking differently: Take 0.4 mg by mouth daily after breakfast.  10/24/16  Yes Ceasar Mons, MD  atorvastatin (LIPITOR) 40 MG tablet Take 1 tablet (40 mg total) by mouth daily at 6 PM. 10/02/18   Oretha Milch D, MD  oxybutynin (DITROPAN) 5 MG tablet Take 1 tablet (5 mg total) by mouth every 8 (eight) hours as needed for bladder spasms. Patient not taking: Reported on 10/22/2018 05/12/18   Ceasar Mons, MD  phenazopyridine (PYRIDIUM) 200 MG tablet Take 1 tablet (200 mg total) by mouth 3 (three) times daily as needed (for pain with urination). Patient not taking: Reported on 10/22/2018 05/12/18 05/12/19  Ceasar Mons, MD    Family History Family History  Problem Relation Age of Onset   Blindness Mother    Cataracts Mother    Diabetes Mother    Glaucoma Mother    Macular degeneration Mother    Cataracts Father    Diabetes Father    Diabetes Sister    Diabetes Brother    Blindness Maternal Uncle    Macular degeneration Maternal Uncle    Macular degeneration Paternal Aunt    Amblyopia Neg Hx    Retinal detachment Neg Hx    Strabismus Neg Hx    Retinitis pigmentosa Neg Hx     Social History Social History   Tobacco Use   Smoking status: Former Smoker    Years: 28.00    Types: Cigarettes    Quit date: 10/22/1999    Years since quitting: 19.0   Smokeless tobacco: Never Used  Substance Use Topics   Alcohol use: Yes    Alcohol/week: 7.0 standard drinks    Types: 7 Cans of beer per week    Comment: not daily drinking but usually drinks a 6-pack when he does drinkl; denies a problem with ETOH   Drug use: No      Allergies   Statins and Iohexol   Review of Systems Review of Systems Ten systems are reviewed and are negative for acute change except as noted in the HPI   Physical Exam Updated Vital Signs BP (!) 105/55    Pulse (!) 58    Temp 98.3 F (36.8 C)    Resp (!) 22    Ht 5\' 10"  (1.778 m)    Wt 82.6 kg    SpO2 96%    BMI 26.11 kg/m   Physical Exam Constitutional:      General: He is not in acute distress.    Appearance: Normal appearance. He is well-developed. He is not ill-appearing or diaphoretic.  HENT:     Head: Normocephalic and atraumatic.     Right Ear: External ear normal.     Left Ear: External ear normal.     Nose: Nose normal.  Eyes:  General: Vision grossly intact. Gaze aligned appropriately.     Pupils: Pupils are equal, round, and reactive to light.  Neck:     Musculoskeletal: Normal range of motion.     Trachea: Trachea and phonation normal. No tracheal deviation.  Cardiovascular:     Rate and Rhythm: Normal rate and regular rhythm.     Pulses:          Radial pulses are 2+ on the right side and 2+ on the left side.       Dorsalis pedis pulses are 2+ on the right side and 2+ on the left side.  Pulmonary:     Effort: Pulmonary effort is normal. No respiratory distress.     Breath sounds: Normal breath sounds.  Chest:     Chest wall: No deformity, tenderness or crepitus.  Abdominal:     General: There is no distension.     Palpations: Abdomen is soft.     Tenderness: There is no abdominal tenderness. There is no guarding or rebound.  Musculoskeletal: Normal range of motion.     Right lower leg: He exhibits no tenderness. No edema.     Left lower leg: He exhibits no tenderness. No edema.  Skin:    General: Skin is warm and dry.  Neurological:     Mental Status: He is alert.     GCS: GCS eye subscore is 4. GCS verbal subscore is 5. GCS motor subscore is 6.     Comments: Speech is clear and goal oriented, follows commands Major Cranial nerves  without deficit, no facial droop Moves extremities without ataxia, coordination intact  Psychiatric:        Behavior: Behavior normal.    ED Treatments / Results  Labs (all labs ordered are listed, but only abnormal results are displayed) Labs Reviewed  BASIC METABOLIC PANEL - Abnormal; Notable for the following components:      Result Value   Glucose, Bld 103 (*)    All other components within normal limits  SARS CORONAVIRUS 2 (TAT 6-24 HRS)  CBC  TROPONIN I (HIGH SENSITIVITY)  TROPONIN I (HIGH SENSITIVITY)    EKG EKG Interpretation  Date/Time:  Friday October 22 2018 09:33:23 EDT Ventricular Rate:  68 PR Interval:  168 QRS Duration: 84 QT Interval:  378 QTC Calculation: 401 R Axis:   52 Text Interpretation:  Normal sinus rhythm Normal ECG normal, no change Confirmed by Charlesetta Shanks [phone]) on 10/22/2018 12:15:42 PM   Radiology Dg Chest 2 View  Result Date: 10/22/2018 CLINICAL DATA:  Left-sided chest pain, shortness of breath, and dizziness beginning this morning. EXAM: CHEST - 2 VIEW COMPARISON:  09/28/2018 FINDINGS: The heart size and mediastinal contours are within normal limits. Aortic atherosclerosis. Mild left basilar scarring is stable. No evidence of pulmonary infiltrate or edema. No evidence of pleural effusion. Cardiac loop recorder noted. The visualized skeletal structures are unremarkable. IMPRESSION: Stable mild left basilar scarring. No active cardiopulmonary disease. Electronically Signed   By: Marlaine Hind M.D.   On: 10/22/2018 10:17    Procedures Procedures (including critical care time)  Medications Ordered in ED Medications  sodium chloride flush (NS) 0.9 % injection 3 mL (0 mLs Intravenous Hold 10/22/18 1014)  aspirin chewable tablet 243 mg (243 mg Oral Given 10/22/18 1036)     Initial Impression / Assessment and Plan / ED Course  I have reviewed the triage vital signs and the nursing notes.  Pertinent labs & imaging results that were  available during  my care of the patient were reviewed by me and considered in my medical decision making (see chart for details).  Clinical Course as of Oct 21 1213  Fri Oct 22, 2018  1152 Dr. Lorin Mercy   [BM]    Clinical Course User Index [BM] Nuala Alpha A, PA-C   CBC within normal limit BMP nonacute Initial HS Troponin: 4 CXR:  IMPRESSION:  Stable mild left basilar scarring. No active cardiopulmonary  disease.   EKG:  Normal sinus rhythm Normal ECG normal, no change Confirmed by Charlesetta Shanks [phone]) on 10/22/2018 12:15:42 PM - On initial evaluation patient well-appearing and in no acute distress.  He denies any chest pain at this time.  Lungs clear to auscultation bilaterally, heart regular rate and rhythm without murmur, abdomen soft without tenderness or peritoneal signs, neurovascular intact to all 4 extremities without sign of DVT.  Patient has been given remainder of aspirin today.  Heart score greater than 4 will consult hospitalist for admission. - Patient reassessed resting comfortably no acute distress states understanding of care plan and is agreeable.  Vital signs stable. - Discussed case with Dr. Lorin Mercy from hospitalist service will be seeing patient for admission. - Patient has been admitted to hospital service for further evaluation management.  Patient seen and evaluated by Dr. Johnney Killian during this visit.  Note: Portions of this report may have been transcribed using voice recognition software. Every effort was made to ensure accuracy; however, inadvertent computerized transcription errors may still be present. Final Clinical Impressions(s) / ED Diagnoses   Final diagnoses:  Chest pain, unspecified type    ED Discharge Orders    None       Gari Crown 10/22/18 1216    Charlesetta Shanks, MD 10/25/18 1414

## 2018-10-22 NOTE — Consult Note (Addendum)
Cardiology Consultation:   Patient ID: Shaun Ball MRN: [id number]; DOB: October 19, 1950  Admit date: 10/22/2018 Date of Consult: 10/22/2018  Primary Care Provider: Reynold Bowen, MD Primary Cardiologist: No primary care provider on file.  Primary Electrophysiologist:  None    Patient Profile:   Shaun Ball is a 68 y.o. male with a hx of recent stroked 09/28/18, hypertension, hyperlipidemia, bladder cancer, and OSA on CPAP who is being seen today for the evaluation of chest pain at the request of Dr. Lorin Mercy.  History of Present Illness:   Mr. Cantera has no known cardiac history. Patient was admitted 8/18 - 8/22 for a stroke. EF at the time was 60-65% with normal diastolic function. Tele was showing NSR and no arrhythmias. Before his TEE he has a 11.17 second pause and syncope. Dr. Curt Bears determined he is not a candidate for PPM since this was a rare occurrence and a loop recorder was placed. He was seen in the office 10/11/18 by Tommye Standard and the Loop recorder interrogation showed NSR.  Patient presented to the ED 10/22/18 for chest pain. Patient noticed the pain early this morning after waking up. It started around 7:00 AM while he was laying in the bed. He described it as a left sided pressure and it radiated up into his chest and down the left arm. The pressure lasted for about 1-2 minutes. He denied sob, N/V. Since his stroke he says he felt this pain before at rest, but not as severe. He took a baby aspirin and plavix and the pain improved. Patient denied fever, chills, cough. Denies recent swelling or orthopnea.   In the ER B/P 105/55, pulse 58, afebrile, Resp 22, 96% O2. Labs revealed potassium 4.5, glucose 103, creatinine 1.08. WBC 6.6, Hgb 16.3. CXR was negative for active disease. EKG showed NSR, 68 bpm, possible ST changes II and III although baseline quality was poor. Second EKG showed nonspecific T wave changes in  III. HS troponin 4 > 3. Patient was admitted for further work-up.  Since arrival patient has been chest pain free.   At baseline the patient is active and able to perform moderate yard work without chest pain. He quit smoking 18 years ago. He drinks 1-2 beers a day. Denies drug use. Patient has positive family history for heart disease in his brother as well as a family histroy of diabetes. Patient is statin intolerant. It causes him to get welts and an itchy rash. His PCP is trying to qualify for him an injection, possibly Repatha ? He has lost 10 lbs in the last month with diet modifications.    Heart Pathway Score:     Past Medical History:  Diagnosis Date   Arthritis    Bladder cancer ([place])    CVA (cerebral vascular accident) ([place]) 09/2018   History of kidney stones 2012   passed / has renal cyst to evaluated at later time   Hypertension    Wears glasses     Past Surgical History:  Procedure Laterality Date   BUBBLE STUDY  10/01/2018   Procedure: BUBBLE STUDY;  Surgeon: Fay Records, MD;  Location: [place];  Service: Cardiovascular;;   CYSTOSCOPY N/A 11/05/2016   Procedure: Consuela Mimes;  Surgeon: Ceasar Mons, MD;  Location: [facility];  Service: Urology;  Laterality: N/A;   CYSTOSCOPY WITH BIOPSY Bilateral 10/24/2016   Procedure: CYSTOSCOPY WITH BLADDER BIOPSY/ BILATERAL RETROGRADE [id number];  Surgeon: Ceasar Mons, MD;  Location: [facility];  Service: Urology;  Laterality: Bilateral;   CYSTOSCOPY WITH BIOPSY N/A 05/12/2018   Procedure: CYSTOSCOPY WITH BLADDER BIOPSY/ BILATERAL RETROGRADE PYELOGRAM BIOPSY;  Surgeon: Ceasar Mons, MD;  Location: [facility];  Service: Urology;  Laterality: N/A;   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 left 03-25-2006;  right 07-07-2006   and Gastroc Slide   fattytumorremoved from back      [place] N/A 10/01/2018   Procedure: [place];  Surgeon: Constance Haw, MD;  Location:  [facility];  Service: Cardiovascular;  Laterality: N/A;   TEE WITHOUT CARDIOVERSION N/A 10/01/2018   Procedure: TRANSESOPHAGEAL ECHOCARDIOGRAM (TEE);  Surgeon: Fay Records, MD;  Location: [place];  Service: Cardiovascular;  Laterality: N/A;   TRANSURETHRAL RESECTION OF BLADDER TUMOR N/A 11/05/2016   Procedure: TRANSURETHRAL RESECTION OF BLADDER TUMOR (TURBT);  Surgeon: Ceasar Mons, MD;  Location: [facility];  Service: Urology;  Laterality: N/A;     Home Medications:  Prior to Admission medications   Medication Sig Start Date End Date Taking? Authorizing Provider  acetaminophen (TYLENOL) 500 MG tablet Take 500 mg by mouth every 6 (six) hours as needed for moderate pain.   Yes [provider]  aspirin 81 MG chewable tablet Chew 1 tablet (81 mg total) by mouth daily. 10/03/18  Yes Oretha Milch D, MD  clopidogrel (PLAVIX) 75 MG tablet Take 1 tablet (75 mg total) by mouth daily for 21 days. 10/03/18 10/24/18 Yes Desiree Hane, MD  fluticasone (FLONASE) 50 MCG/ACT nasal spray Place 1 spray into both nostrils as needed for allergies or rhinitis.   Yes [provider]  lisinopril (PRINIVIL,ZESTRIL) 40 MG tablet Take 40 mg by mouth every evening.    Yes [provider]  Melatonin 5 MG TABS Take 5 mg by mouth at bedtime.   Yes [provider]  montelukast (SINGULAIR) 10 MG tablet Take 10 mg by mouth daily as needed (allergies).  07/14/17  Yes [provider]  tamsulosin (FLOMAX) 0.4 MG CAPS capsule Take 1 capsule (0.4 mg total) by mouth daily. Patient taking differently: Take 0.4 mg by mouth daily after breakfast.  10/24/16  Yes Ceasar Mons, MD    Inpatient Medications: Scheduled Meds:  [START ON 10/23/2018] aspirin  81 mg Oral Daily   [START ON 10/23/2018] clopidogrel  75 mg Oral Daily   enoxaparin (LOVENOX) injection  40 mg Subcutaneous Q24H   Melatonin  5 mg Oral QHS   [START ON 10/23/2018]  tamsulosin  0.4 mg Oral QPC breakfast   Continuous Infusions:  PRN Meds: acetaminophen, ondansetron (ZOFRAN) IV  Allergies:    Allergies  Allergen Reactions   Statins Hives and Rash    whelps,liver enzymes elevated   Iohexol Other (See Comments)    Pt had sneezing after his CT with IV contrast, lasting only 10- 15 minutes, no intervention was needed, per radiologist    Social History:   Social History   Socioeconomic History   Marital status: Legally Separated    Spouse name: Not on file   Number of children: Not on file   Years of education: Not on file   Highest education level: Not on file  Occupational History   Occupation: retired  Scientist, product/process development strain: Not hard at all   Food insecurity    Worry: Never true    Inability: Never true   Transportation needs    Medical: No    Non-medical: No  Tobacco Use   Smoking status: Former Smoker    Years: 28.00    Types: Cigarettes    Quit date: 10/22/1999    Years since quitting: 19.0   Smokeless tobacco: Never Used  Substance and Sexual Activity   Alcohol use: Yes    Alcohol/week: 7.0 standard drinks    Types: 7 Cans of beer per week    Comment: not daily drinking but usually drinks a 6-pack when he does drinkl; denies a problem with ETOH   Drug use: No   Sexual activity: Not on file  Lifestyle   Physical activity    Days per week: 0 days    Minutes per session: 0 min   Stress: Not at all  Relationships   Social connections    Talks on phone: Not on file    Gets together: Not on file    Attends religious service: Not on file    Active member of club or organization: Not on file    Attends meetings of clubs or organizations: Not on file    Relationship status: Not on file   Intimate partner violence    Fear of current or ex partner: Not on file    Emotionally abused: Not on file    Physically abused: Not on file    Forced sexual activity: Not on file  Other Topics  Concern   Not on file  Social History Narrative   [name] a 68 year old retired legally separated male He lives at home and has the assistance of his son and [place]     Family History:   Family History  Problem Relation Age of Onset   Blindness Mother    Cataracts Mother    Diabetes Mother    Glaucoma Mother    Macular degeneration Mother    Cataracts Father    Diabetes Father    Diabetes Sister    Diabetes Brother    Blindness Maternal Uncle    Macular degeneration Maternal Uncle    Macular degeneration Paternal Aunt    Amblyopia Neg Hx    Retinal detachment Neg Hx    Strabismus Neg Hx    Retinitis pigmentosa Neg Hx      ROS:  Please see the history of present illness.  All other ROS reviewed and negative.     Physical Exam/Data:   Vitals:   10/22/18 1015 10/22/18 1030 10/22/18 1100 10/22/18 1130  BP:  111/68 115/70 (!) 105/55  Pulse:  66 (!) 59 (!) 58  Resp:  (!) 21 19 (!) 22  Temp:      SpO2:  94% 95% 96%  Weight: 82.6 kg     Height: 5\' 10"  (1.778 m)      No intake or output data in the 24 hours ending 10/22/18 1228 Last 3 Weights 10/22/2018 10/11/2018 09/30/2018  Weight (lbs) 182 lb 182 lb 185 lb 3 oz  Weight (kg) 82.555 kg 82.555 kg 84 kg     Body mass index is 26.11 kg/m.  General:  Well nourished, well developed, in no acute distress HEENT: normal Lymph: no adenopathy Neck: no JVD Endocrine:  No thryomegaly Vascular: No carotid bruits; FA pulses 2+ bilaterally without bruits  Cardiac:  normal S1, S2; RRR; no murmur  Lungs:  clear to auscultation bilaterally, no wheezing, rhonchi or rales  Abd: soft, nontender, no hepatomegaly  Ext: no edema Musculoskeletal:  No deformities, BUE and BLE strength normal and equal Skin: warm and dry  Neuro:  CNs 2-12 intact, no focal abnormalities noted Psych:  Normal affect   EKG:  The EKG was personally reviewed and demonstrates:  Poor quality at baseline. NSR 68 bpm,possible minimal  ST elevation II-III.  Second EKG showed inverted T waves in III. (seen in old EKGs 2018) Telemetry:  Telemetry was personally reviewed and demonstrates:  Sinus brady and NSR, rates 50-60s, no other arrhythmias noted  Relevant CV Studies:  Echo 09/29/18 IMPRESSIONS  1. The left ventricle has normal systolic function with an ejection fraction of 60-65%. The cavity size was normal. Left ventricular diastolic parameters were normal.  2. The right ventricle has normal systolic function. The cavity was normal. There is no increase in right ventricular wall thickness.  3. Mild thickening of the mitral valve leaflet.  4. The aortic valve is tricuspid. Mild thickening of the aortic valve. Sclerosis without any evidence of stenosis of the aortic valve.  5. The aorta is normal unless otherwise noted.  Laboratory Data:  High Sensitivity Troponin:   Recent Labs  Lab 10/22/18 0949  TROPONINIHS 4     Chemistry Recent Labs  Lab 10/22/18 0949  NA 136  K 4.5  CL 102  CO2 25  GLUCOSE 103*  BUN 18  CREATININE 1.08  CALCIUM 9.7  GFRNONAA >60  GFRAA >60  ANIONGAP 9    No results for input(s): PROT, ALBUMIN, AST, ALT, ALKPHOS, BILITOT in the last 168 hours. Hematology Recent Labs  Lab 10/22/18 0949  WBC 6.6  RBC 5.42  HGB 16.3  HCT 49.6  MCV 91.5  MCH 30.1  MCHC 32.9  RDW 12.6  PLT 202   BNPNo results for input(s): BNP, PROBNP in the last 168 hours.  DDimer No results for input(s): DDIMER in the last 168 hours.   Radiology/Studies:  Dg Chest 2 View  Result Date: 10/22/2018 CLINICAL DATA:  Left-sided chest pain, shortness of breath, and dizziness beginning this morning. EXAM: CHEST - 2 VIEW COMPARISON:  09/28/2018 FINDINGS: The heart size and mediastinal contours are within normal limits. Aortic atherosclerosis. Mild left basilar scarring is stable. No evidence of pulmonary infiltrate or edema. No evidence of pleural effusion. Cardiac loop recorder noted. The visualized skeletal  structures are unremarkable. IMPRESSION: Stable mild left basilar scarring. No active cardiopulmonary disease. Electronically Signed   By: Marlaine Hind M.D.   On: 10/22/2018 10:17    Assessment and Plan:   Atypical Chest pain Patient experienced left sided chest pressure with radiation down his left arm for 1-2 minutes this AM at rest, relieved by ASA and plavix.  - Denies recurrence of chest pain since arrival. He is chest pain free on exam.  - HS troponin trend normal 3 > 4 - CXR no active disease - EKG with nonspecific T wave changes. - Recent echo showed [id number] with no diastolic dysfunction and no wall motion abnormality - At baseline patient says he is normally active and able to perform relatively heavy yard work. Denies chest pain during this.  - Low suspicion for ACS at this point given troponin and EKG. Given risk factors (HTN and untreated hyperlipidemia) can consider a stress test to evaluate for ischemia. Considering troponin trend is normal will set up an outpatient stress test.   - Continue lifestyle modifications. Patient says he has lost 10 lbs through diet modification.  - Recommend reduction of alcohol use  Recent H/o stroke 09/28/2018 - No residual effects and no signs of stroke at this time. - Continue ASA and Plavix  HTN - Home meds lisinopril 40 mg daily - Stable since admission, 105/55  Hyperlipidemia - Home meds report Atorvastatin although patient says he is intolerant to statins. He thinks his PCP is trying to qualify him for Repatha.  - Recommend referral to lipid clinic if unable to qualify - LDL 219 09/29/18  OSA on CPAP - Sometimes compliant  H/o Tobacco abuse  - Patient quit 18 years ago  For questions or updates, please contact [facility] Please consult [email or web address] for contact info under   Signed, Cadence Ninfa Meeker, PA-C  10/22/2018 12:28 PM   Patient seen, examined. Available data reviewed. Agree with findings, assessment, and plan as  outlined by Cadence Kathlen Mody, PA.  On my examination the patient is alert, oriented, in no distress.  Lungs are clear, JVP is normal, carotid upstrokes are normal without bruits, heart is regular rate and rhythm with no murmur gallop, abdomen is soft and nontender with no masses, rebound, or guarding.  Extremities show no edema.  The skin is warm and dry without rash.  The patient's EKG demonstrates normal sinus rhythm and is within normal limits.  High-sensitivity troponins are negative x2.  The patient had a fairly brief episode of chest discomfort this morning.  His description of pain has some typical and atypical components, but it is notable that it occurred at rest and lasted only 4 to 5 minutes.  He admits that he has been quite anxious since his stroke and was just concerned about his risk for another stroke.  He is physically active and has not had any exertional symptoms.  I think considering the brief duration of pain, normal high-sensitivity troponins, and normal EKG, he can be further worked up as an outpatient.  I have recommended a gated coronary CTA-FFR and this will be arranged.  His medical program includes dual antiplatelet therapy with aspirin and clopidogrel after his recent stroke.  He is not on a statin drug because of intolerance.  He has been evaluated for a PCSK9 inhibitor by Dr. Forde Dandy.  The patient is stable from a cardiac perspective and could be discharged home from the emergency department.  Thanks and please call if any questions.  Sherren Mocha, M.D. 10/22/2018 3:55 PM

## 2018-10-22 NOTE — ED Notes (Signed)
Patient verbalizes understanding of discharge instructions. Opportunity for questioning and answers were provided. Armband removed by staff, pt discharged from ED.  

## 2018-10-22 NOTE — H&P (Addendum)
ER Consult note   Shaun Ball [id number] DOB: February 28, 1950 DOA: 10/22/2018  PCP: Shaun Bowen, MD Consultants:  Shaun Ball - cardiology; Shaun Ball Patient coming from:  Home - lives with son, his girlfriend, and 69 yo granddaughter; NOK: Son, [phone]  Chief Complaint: Chest pain  HPI: Shaun Ball is a 68 y.o. male with medical history significant of HTN; CVA (09/28/18); and bladder cancer (s/p BCG treatment, in remission) presenting with chest pain.  He woke up this AM with tightness in his chest.  It eased off while he was lying there.  He finally got up and took morning meds including ASA and Plavix.  After his shower, he felt a little dizzy and decided to come to the ER.  Pain was in the axillary area on the left.  No SOB.  No nausea.  No diaphoresis.  No fever.  Chronic mild cough unchanged from usual.    He was admitted from 8/18-22 for CVA with scattered R MCA infarcts.  Loop recorder inserted.  Treated with ASA and Plavix (for 21 days, still taking both).  LDL was 219 and patient is intolerant of statins - unable to take Crestor or Lipitor.   ED Course:  Chest pain this AM. Previously admitted for CVA with intermittent milder CP since.  Improved after ASA and Plavix.  Heart score is 5.  Currently pain-free.  Negative EKG and troponin.  Review of Systems: As per HPI; otherwise review of systems reviewed and negative.   Ambulatory Status:  Ambulates without assistance  Past Medical History:  Diagnosis Date  . Arthritis   . Bladder cancer ([place])   . CVA (cerebral vascular accident) ([place]) 09/2018  . History of kidney stones 2012   passed / has renal cyst to evaluated at later time  . Hypertension   . Wears glasses     Past Surgical History:  Procedure Laterality Date  . BUBBLE STUDY  10/01/2018   Procedure: BUBBLE STUDY;  Surgeon: Shaun Records, MD;  Location: [place];  Service: Cardiovascular;;  . CYSTOSCOPY N/A 11/05/2016   Procedure: Shaun Ball;  Surgeon:  Shaun Mons, MD;  Location: [facility];  Service: Ball;  Laterality: N/A;  . CYSTOSCOPY WITH BIOPSY Bilateral 10/24/2016   Procedure: CYSTOSCOPY WITH BLADDER BIOPSY/ BILATERAL RETROGRADE [id number];  Surgeon: Shaun Mons, MD;  Location: [facility];  Service: Ball;  Laterality: Bilateral;  . CYSTOSCOPY WITH BIOPSY N/A 05/12/2018   Procedure: CYSTOSCOPY WITH BLADDER BIOPSY/ BILATERAL RETROGRADE PYELOGRAM BIOPSY;  Surgeon: Shaun Mons, MD;  Location: [facility];  Service: Ball;  Laterality: N/A;  . 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 fattytumorremoved from back     . LOOP RECORDER INSERTION N/A 10/01/2018   Procedure: LOOP RECORDER INSERTION;  Surgeon: Shaun Haw, MD;  Location: [place] [facility];  Service: Cardiovascular;  Laterality: N/A;  . TEE WITHOUT CARDIOVERSION N/A 10/01/2018   Procedure: TRANSESOPHAGEAL ECHOCARDIOGRAM (TEE);  Surgeon: Shaun Records, MD;  Location: [place];  Service: Cardiovascular;  Laterality: N/A;  . TRANSURETHRAL RESECTION OF BLADDER TUMOR N/A 11/05/2016   Procedure: TRANSURETHRAL RESECTION OF BLADDER TUMOR (TURBT);  Surgeon: Shaun Mons, MD;  Location: [facility];  Service: Ball;  Laterality: N/A;    Social History   Socioeconomic History  . Marital status: Legally Separated    Spouse name: Not on file  . Number of children: Not  on file  . Years of education: Not on file  . Highest education level: Not on file  Occupational History  . Occupation: retired  Scientific laboratory technician  . Financial resource strain: Not hard at all  . Food insecurity    Worry: Never true    Inability: Never true  . Transportation needs    Medical: No    Non-medical: No  Tobacco Use  . Smoking status: Former Smoker    Years: 28.00    Types: Cigarettes    Quit date: 10/22/1999     Years since quitting: 19.0  . Smokeless tobacco: Never Used  Substance and Sexual Activity  . Alcohol use: Yes    Alcohol/week: 7.0 standard drinks    Types: 7 Cans of beer per week    Comment: not daily drinking but usually drinks a 6-pack when he does drinkl; denies a problem with ETOH  . Drug use: No  . Sexual activity: Not on file  Lifestyle  . Physical activity    Days per week: 0 days    Minutes per session: 0 min  . Stress: Not at all  Relationships  . Social Herbalist on phone: Not on file    Gets together: Not on file    Attends religious service: Not on file    Active member of club or organization: Not on file    Attends meetings of clubs or organizations: Not on file    Relationship status: Not on file  . Intimate partner violence    Fear of current or ex partner: Not on file    Emotionally abused: Not on file    Physically abused: Not on file    Forced sexual activity: Not on file  Other Topics Concern  . Not on file  Social History Narrative   Shaun Ball a 68 year old retired legally separated male He lives at home and has the assistance of his son and Margreta Journey     Allergies  Allergen Reactions  . Statins Hives and Rash    whelps,liver enzymes elevated  . Iohexol Other (See Comments)    Pt had sneezing after his CT with IV contrast, lasting only 10- 15 minutes, no intervention was needed, per radiologist    Family History  Problem Relation Age of Onset  . Blindness Mother   . Cataracts Mother   . Diabetes Mother   . Glaucoma Mother   . Macular degeneration Mother   . Cataracts Father   . Diabetes Father   . Diabetes Sister   . Diabetes Brother   . Blindness Maternal Uncle   . Macular degeneration Maternal Uncle   . Macular degeneration Paternal Aunt   . Amblyopia Neg Hx   . Retinal detachment Neg Hx   . Strabismus Neg Hx   . Retinitis pigmentosa Neg Hx     Prior to Admission medications   Medication Sig Start Date End  Date Taking? Authorizing Provider  acetaminophen (TYLENOL) 500 MG tablet Take 500 mg by mouth every 6 (six) hours as needed for moderate pain.   Yes [provider]  aspirin 81 MG chewable tablet Chew 1 tablet (81 mg total) by mouth daily. 10/03/18  Yes Oretha Milch D, MD  clopidogrel (PLAVIX) 75 MG tablet Take 1 tablet (75 mg total) by mouth daily for 21 days. 10/03/18 10/24/18 Yes Desiree Hane, MD  fluticasone (FLONASE) 50 MCG/ACT nasal spray Place 1 spray into both nostrils as needed for  allergies or rhinitis.   Yes [provider]  lisinopril (PRINIVIL,ZESTRIL) 40 MG tablet Take 40 mg by mouth every evening.    Yes [provider]  Melatonin 5 MG TABS Take 5 mg by mouth at bedtime.   Yes [provider]  montelukast (SINGULAIR) 10 MG tablet Take 10 mg by mouth daily as needed (allergies).  07/14/17  Yes [provider]  tamsulosin (FLOMAX) 0.4 MG CAPS capsule Take 1 capsule (0.4 mg total) by mouth daily. Patient taking differently: Take 0.4 mg by mouth daily after breakfast.  10/24/16  Yes Shaun Mons, MD  atorvastatin (LIPITOR) 40 MG tablet Take 1 tablet (40 mg total) by mouth daily at 6 PM. 10/02/18   Oretha Milch D, MD  oxybutynin (DITROPAN) 5 MG tablet Take 1 tablet (5 mg total) by mouth every 8 (eight) hours as needed for bladder spasms. Patient not taking: Reported on 10/22/2018 05/12/18   Shaun Mons, MD  phenazopyridine (PYRIDIUM) 200 MG tablet Take 1 tablet (200 mg total) by mouth 3 (three) times daily as needed (for pain with urination). Patient not taking: Reported on 10/22/2018 05/12/18 05/12/19  Shaun Mons, MD    Physical Exam: Vitals:   10/22/18 1015 10/22/18 1030 10/22/18 1100 10/22/18 1130  BP:  111/68 115/70 (!) 105/55  Pulse:  66 (!) 59 (!) 58  Resp:  (!) 21 19 (!) 22  Temp:      SpO2:  94% 95% 96%  Weight: 82.6 kg     Height: 5\' 10"  (1.778 m)        . General:  Appears calm and  comfortable and is NAD . Eyes:  PERRL, EOMI, normal lids, iris . ENT:  grossly normal hearing, lips & tongue, mmm; appropriate dentition . Neck:  no LAD, masses or thyromegaly . Cardiovascular:  RRR, no m/r/g. No LE edema.  Marland Kitchen Respiratory:   CTA bilaterally with no wheezes/rales/rhonchi.  Normal respiratory effort. . Abdomen:  soft, NT, ND, NABS . Skin:  no rash or induration seen on limited exam . Musculoskeletal:  grossly normal tone BUE/BLE, good ROM, no bony abnormality . Psychiatric:  grossly normal mood and affect, speech fluent and appropriate, AOx3 . Neurologic:  CN 2-12 grossly intact, moves all extremities in coordinated fashion, sensation intact    Radiological Exams on Admission: Dg Chest 2 View  Result Date: 10/22/2018 CLINICAL DATA:  Left-sided chest pain, shortness of breath, and dizziness beginning this morning. EXAM: CHEST - 2 VIEW COMPARISON:  09/28/2018 FINDINGS: The heart size and mediastinal contours are within normal limits. Aortic atherosclerosis. Mild left basilar scarring is stable. No evidence of pulmonary infiltrate or edema. No evidence of pleural effusion. Cardiac loop recorder noted. The visualized skeletal structures are unremarkable. IMPRESSION: Stable mild left basilar scarring. No active cardiopulmonary disease. Electronically Signed   By: Marlaine Hind M.D.   On: 10/22/2018 10:17    EKG: Independently reviewed.   [id number] - NSR with rate 68; nonspecific ST changes with no evidence of acute ischemia 0933 - NSR with rate 63; nonspecific ST changes with no evidence of acute ischemia   Labs on Admission: I have personally reviewed the available labs and imaging studies at the time of the admission.  Pertinent labs:   Generally normal BMP HS troponin 4, 3 Normal CBC   Assessment/Plan Principal Problem:   Chest pain Active Problems:   Essential hypertension   H/O: CVA (cerebrovascular accident)   Bladder cancer ([place])   Chest pain -Patient with  left-sided chest pressure that came on at rest and resolved spontaneously -0-1/3 typical symptoms suggestive of noncardiac chest pain.  -CXR unremarkable.   -Initial cardiac HS troponin negative.  -EKG not indicative of acute ischemia.   -HEART pathway score is 6, indicating that the patient has an elevated risk score and requires further evaluation. -Continue ASA and Plavix -Recent A1c 6.0, no need for further monitoring/intervention at this time but will need outpatient management -Recent normal TSH -Will check UDS -Cardiology consultation to determine if further evaluation is needed - likely stress test vs. CTA -Has loop recorder in place  HTN -Takes lisinopril monotherapy at home -Patient with low normal BPs while in the ER -Will hold lisinopril for now  HLD -Unable to tolerate statins due to rash/elevated LFTs -Lipids were checked on 819/20 (TC 289, HDL 34, LDL 219, TG 181) so will not repeat at this time -Consider repatha, will defer to cardiology  Recent CVA -Patient was admitted last month for CVA, so he does have apparent CVD RF -Markedly elevated lipids, unable to tolerate statin -Started on ASA and Plavix with DAPT x 3 weeks, due to be done soon -Continue for now  Bladder CA -Unable to view Ball -Patient reports intermittent cystoscopy surveillance with apparent last look in 4/20 -Plans to see Ball again in 01/2019    Note: This patient has been tested and is pending for the novel coronavirus COVID-19.   Cardiology has seen the patient and will arrange for outpatient CTA.  His pain is atypical and he is no longer having any pain.  We are in agreement that the patient is appropriate for d/c from the ER at this time.    Karmen Bongo MD [facility]   How to contact the [facility] Attending or Consulting provider [place] or covering provider during after hours [place], for this patient?  1. Check the care team in [facility] and look for a) attending/consulting  TRH provider listed and b) the [facility] team listed 2. Log into [email or web address] and use 's universal password to access. If you do not have the password, please contact the hospital operator. 3. Locate the [facility] provider you are looking for under [facility] and page to a number that you can be directly reached. 4. If you still have difficulty reaching the provider, please page the [facility] (Director on Call) for the Hospitalists listed on amion for assistance.   10/22/2018, 12:21 PM

## 2018-10-22 NOTE — ED Notes (Signed)
Cards at bedside

## 2018-10-22 NOTE — ED Provider Notes (Signed)
Medical screening examination/treatment/procedure(s) were conducted as a shared visit with non-physician practitioner(s) and myself.  I personally evaluated the patient during the encounter.    Patient developed pain at about 7 AM.  He reports that he was not yet out of bed it was a pressure type sensation in his chest with some radiation into the arm.  He reports it did wax and wane.  He took his aspirin and Plavix and that seemed to help.  Patient reports he did however feel slightly lightheaded with it and thought he should be seen at the hospital.  Patient is alert and appropriate.  No respiratory distress.  Heart regular no rub murmur gallop lungs clear.  Abdomen nontender.  No lower extremity edema  I agree with plan of management.   Charlesetta Shanks, MD 10/22/18 1154

## 2018-10-22 NOTE — ED Notes (Signed)
ED Provider at bedside. 

## 2018-10-26 ENCOUNTER — Encounter: Payer: No Typology Code available for payment source | Admitting: *Deleted

## 2018-11-02 ENCOUNTER — Telehealth (HOSPITAL_COMMUNITY): Payer: Self-pay | Admitting: *Deleted

## 2018-11-02 NOTE — Telephone Encounter (Signed)
Left message on voicemail in reference to upcoming appointment scheduled for 11/05/2018. Phone number given for a call back so details instructions can be given. Hazeline Charnley, Ranae Palms No my chart

## 2018-11-04 ENCOUNTER — Ambulatory Visit (INDEPENDENT_AMBULATORY_CARE_PROVIDER_SITE_OTHER): Payer: Medicare Other | Admitting: *Deleted

## 2018-11-04 DIAGNOSIS — I63411 Cerebral infarction due to embolism of right middle cerebral artery: Secondary | ICD-10-CM

## 2018-11-05 ENCOUNTER — Other Ambulatory Visit: Payer: Self-pay

## 2018-11-05 ENCOUNTER — Ambulatory Visit (HOSPITAL_COMMUNITY): Payer: Medicare Other | Attending: Cardiovascular Disease

## 2018-11-05 ENCOUNTER — Encounter (INDEPENDENT_AMBULATORY_CARE_PROVIDER_SITE_OTHER): Payer: Self-pay

## 2018-11-05 DIAGNOSIS — R079 Chest pain, unspecified: Secondary | ICD-10-CM

## 2018-11-05 DIAGNOSIS — R0789 Other chest pain: Secondary | ICD-10-CM

## 2018-11-05 LAB — MYOCARDIAL PERFUSION IMAGING
LV dias vol: 82 mL (ref 62–150)
LV sys vol: 32 mL
Peak HR: 80 {beats}/min
Rest HR: 54 {beats}/min
SDS: 3
SRS: 0
SSS: 3
TID: 1.11

## 2018-11-05 LAB — CUP PACEART REMOTE DEVICE CHECK
Date Time Interrogation Session: 20200925081732
Implantable Pulse Generator Implant Date: 20200821

## 2018-11-05 MED ORDER — REGADENOSON 0.4 MG/5ML IV SOLN
0.4000 mg | Freq: Once | INTRAVENOUS | Status: AC
  Administered 2018-11-05: 10:00:00 0.4 mg via INTRAVENOUS

## 2018-11-05 MED ORDER — TECHNETIUM TC 99M TETROFOSMIN IV KIT
30.9000 | PACK | Freq: Once | INTRAVENOUS | Status: AC | PRN
  Administered 2018-11-05: 10:00:00 30.9 via INTRAVENOUS
  Filled 2018-11-05: qty 31

## 2018-11-05 MED ORDER — TECHNETIUM TC 99M TETROFOSMIN IV KIT
10.5000 | PACK | Freq: Once | INTRAVENOUS | Status: AC | PRN
  Administered 2018-11-05: 10.5 via INTRAVENOUS
  Filled 2018-11-05: qty 11

## 2018-11-11 NOTE — Progress Notes (Signed)
Carelink Summary Report / Loop Recorder 

## 2018-11-12 DIAGNOSIS — Z23 Encounter for immunization: Secondary | ICD-10-CM | POA: Diagnosis not present

## 2018-11-17 ENCOUNTER — Inpatient Hospital Stay: Payer: Medicare Other | Admitting: Adult Health

## 2018-11-18 ENCOUNTER — Encounter: Payer: Self-pay | Admitting: Vascular Surgery

## 2018-11-18 ENCOUNTER — Other Ambulatory Visit: Payer: Self-pay

## 2018-11-18 ENCOUNTER — Ambulatory Visit (INDEPENDENT_AMBULATORY_CARE_PROVIDER_SITE_OTHER): Payer: Medicare Other | Admitting: Vascular Surgery

## 2018-11-18 VITALS — BP 123/75 | HR 71 | Temp 97.7°F | Resp 20 | Ht 70.0 in | Wt 181.0 lb

## 2018-11-18 DIAGNOSIS — I639 Cerebral infarction, unspecified: Secondary | ICD-10-CM | POA: Diagnosis not present

## 2018-11-18 DIAGNOSIS — I6521 Occlusion and stenosis of right carotid artery: Secondary | ICD-10-CM | POA: Diagnosis not present

## 2018-11-18 NOTE — Progress Notes (Signed)
Referring Physician: Dr Erlinda Hong  Patient name: Shaun Ball MRN: [id number] DOB: 20-Sep-1950 Sex: male  REASON FOR CONSULT: Moderate right internal carotid artery stenosis  HPI: Shaun Ball is a 68 y.o. male, who sustained a right brain embolic stroke August [id number].  He had some slurring of his speech.  He had an extensive work-up which showed a less than 50% right internal carotid artery stenosis by CTA.  This was 40 to 60% by duplex.  Contralateral side had no significant stenosis.  Patient is currently on Plavix.  He was placed on a statin initially but developed a rash.  They are currently considering alternative antilipid therapy.  He has not had any further neurologic events since August.  He specifically has had no symptoms of TIA amaurosis or stroke.  His speech has almost returned to normal but has not completely returned to normal.  Other medical problems include hypertension and kidney stones both of which have been stable.  Past Medical History:  Diagnosis Date  . Arthritis   . Bladder cancer ([place])   . CVA (cerebral vascular accident) ([place]) 09/2018  . History of kidney stones 2012   passed / has renal cyst to evaluated at later time  . Hypertension   . Wears glasses    Past Surgical History:  Procedure Laterality Date  . BUBBLE STUDY  10/01/2018   Procedure: BUBBLE STUDY;  Surgeon: Fay Records, MD;  Location: [place];  Service: Cardiovascular;;  . CYSTOSCOPY N/A 11/05/2016   Procedure: Consuela Mimes;  Surgeon: Ceasar Mons, MD;  Location: [facility];  Service: Urology;  Laterality: N/A;  . CYSTOSCOPY WITH BIOPSY Bilateral 10/24/2016   Procedure: CYSTOSCOPY WITH BLADDER BIOPSY/ BILATERAL RETROGRADE [id number];  Surgeon: Ceasar Mons, MD;  Location: [facility];  Service: Urology;  Laterality: Bilateral;  . CYSTOSCOPY WITH BIOPSY N/A 05/12/2018   Procedure: CYSTOSCOPY WITH BLADDER BIOPSY/ BILATERAL RETROGRADE PYELOGRAM  BIOPSY;  Surgeon: Ceasar Mons, MD;  Location: [facility];  Service: Urology;  Laterality: N/A;  . 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 fattytumorremoved from back     . LOOP RECORDER INSERTION N/A 10/01/2018   Procedure: LOOP RECORDER INSERTION;  Surgeon: Constance Haw, MD;  Location: [place] [facility];  Service: Cardiovascular;  Laterality: N/A;  . TEE WITHOUT CARDIOVERSION N/A 10/01/2018   Procedure: TRANSESOPHAGEAL ECHOCARDIOGRAM (TEE);  Surgeon: Fay Records, MD;  Location: [place];  Service: Cardiovascular;  Laterality: N/A;  . TRANSURETHRAL RESECTION OF BLADDER TUMOR N/A 11/05/2016   Procedure: TRANSURETHRAL RESECTION OF BLADDER TUMOR (TURBT);  Surgeon: Ceasar Mons, MD;  Location: [facility];  Service: Urology;  Laterality: N/A;    Family History  Problem Relation Age of Onset  . Blindness Mother   . Cataracts Mother   . Diabetes Mother   . Glaucoma Mother   . Macular degeneration Mother   . Cataracts Father   . Diabetes Father   . Diabetes Sister   . Diabetes Brother   . Blindness Maternal Uncle   . Macular degeneration Maternal Uncle   . Macular degeneration Paternal Aunt   . Amblyopia Neg Hx   . Retinal detachment Neg Hx   . Strabismus Neg Hx   . Retinitis pigmentosa Neg Hx     SOCIAL HISTORY: Social History   Socioeconomic History  . Marital status: Legally Separated    Spouse name:  Not on file  . Number of children: Not on file  . Years of education: Not on file  . Highest education level: Not on file  Occupational History  . Occupation: retired  Scientific laboratory technician  . Financial resource strain: Not hard at all  . Food insecurity    Worry: Never true    Inability: Never true  . Transportation needs    Medical: No    Non-medical: No  Tobacco Use  . Smoking status: Former Smoker    Years: 28.00    Types: Cigarettes    Quit  date: 10/22/1999    Years since quitting: 19.0  . Smokeless tobacco: Never Used  Substance and Sexual Activity  . Alcohol use: Yes    Alcohol/week: 7.0 standard drinks    Types: 7 Cans of beer per week    Comment: not daily drinking but usually drinks a 6-pack when he does drinkl; denies a problem with ETOH  . Drug use: No  . Sexual activity: Not on file  Lifestyle  . Physical activity    Days per week: 0 days    Minutes per session: 0 min  . Stress: Not at all  Relationships  . Social Herbalist on phone: Not on file    Gets together: Not on file    Attends religious service: Not on file    Active member of club or organization: Not on file    Attends meetings of clubs or organizations: Not on file    Relationship status: Not on file  . Intimate partner violence    Fear of current or ex partner: Not on file    Emotionally abused: Not on file    Physically abused: Not on file    Forced sexual activity: Not on file  Other Topics Concern  . Not on file  Social History Narrative   Shaun Ball a 68 year old retired legally separated male He lives at home and has the assistance of his son and Margreta Journey     Allergies  Allergen Reactions  . Statins Hives and Rash    whelps,liver enzymes elevated  . Iohexol Other (See Comments)    Pt had sneezing after his CT with IV contrast, lasting only 10- 15 minutes, no intervention was needed, per radiologist    Current Outpatient Medications  Medication Sig Dispense Refill  . acetaminophen (TYLENOL) 500 MG tablet Take 500 mg by mouth every 6 (six) hours as needed for moderate pain.    Marland Kitchen aspirin 81 MG chewable tablet Chew 1 tablet (81 mg total) by mouth daily. 60 tablet 1  . fluticasone (FLONASE) 50 MCG/ACT nasal spray Place 1 spray into both nostrils as needed for allergies or rhinitis.    Marland Kitchen lisinopril (PRINIVIL,ZESTRIL) 40 MG tablet Take 40 mg by mouth every evening.     . Melatonin 5 MG TABS Take 5 mg by mouth at  bedtime.    . montelukast (SINGULAIR) 10 MG tablet Take 10 mg by mouth daily as needed (allergies).   6  . tamsulosin (FLOMAX) 0.4 MG CAPS capsule Take 1 capsule (0.4 mg total) by mouth daily. (Patient taking differently: Take 0.4 mg by mouth daily after breakfast. ) 30 capsule 11   No current facility-administered medications for this visit.     ROS:   General:  No weight loss, Fever, chills  HEENT: No recent headaches, no nasal bleeding, no visual changes, no sore throat  Neurologic: No dizziness, blackouts, seizures.  No recent symptoms of stroke or mini- stroke. No recent episodes of slurred speech, or temporary blindness.  Cardiac: No recent episodes of chest pain/pressure, no shortness of breath at rest.  No shortness of breath with exertion.  Denies history of atrial fibrillation or irregular heartbeat  Vascular: No history of rest pain in feet.  No history of claudication.  No history of non-healing ulcer, No history of DVT   Pulmonary: No home oxygen, no productive cough, no hemoptysis,  No asthma or wheezing  Musculoskeletal:  [ ]  Arthritis, [ ]  Low back pain,  [ ]  Joint pain  Hematologic:No history of hypercoagulable state.  No history of easy bleeding.  No history of anemia  Gastrointestinal: No hematochezia or melena,  No gastroesophageal reflux, no trouble swallowing  Urinary: [ ]  chronic Kidney disease, [ ]  on HD - [ ]  MWF or [ ]  TTHS, [ ]  Burning with urination, [ ]  Frequent urination, [ ]  Difficulty urinating;   Skin: No rashes  Psychological: No history of anxiety,  No history of depression   Physical Examination  Vitals:   11/18/18 0951 11/18/18 0953  BP: 119/75 123/75  Pulse: 71   Resp: 20   Temp: 97.7 F (36.5 C)   SpO2: 97%   Weight: 181 lb (82.1 kg)   Height: 5\' 10"  (1.778 m)     Body mass index is 25.97 kg/m.  General:  Alert and oriented, no acute distress HEENT: Normal Neck: No JVD Pulmonary: Clear to auscultation bilaterally Cardiac:  Regular Rate and Rhythm Abdomen: Soft, non-tender, non-distended, no mass Skin: No rash Extremity Pulses:  2+ radial, brachial, femoral, dorsalis pedis, posterior tibial pulses bilaterally Musculoskeletal: No deformity or edema  Neurologic: Upper and lower extremity motor 5/5 and symmetric, no facial asymmetry  DATA:  Reviewed the images from the patient's CT angios of the head and neck.  Again minimal stenosis of the left carotid system 50% stenosis of the right internal carotid artery system just after the bifurcation with some calcification.  I also reviewed the patient's images from his duplex exam.  ASSESSMENT: Moderate right internal carotid artery stenosis 50% or less.  No further stroke symptoms.  Embolic event of unknown origin.  Doubt benefit for carotid endarterectomy at this point.   PLAN: Patient will have a follow-up carotid duplex scan and be seen in our APP clinic in 1 year.  He will return sooner if he develops new neurologic symptoms.   Ruta Hinds, MD [facility] Office: [phone] Pager: [phone]

## 2018-11-24 ENCOUNTER — Inpatient Hospital Stay: Payer: Medicare Other | Admitting: Adult Health

## 2018-12-07 ENCOUNTER — Ambulatory Visit: Payer: Medicare Other | Admitting: *Deleted

## 2018-12-08 LAB — CUP PACEART REMOTE DEVICE CHECK
Date Time Interrogation Session: 20201027232038
Implantable Pulse Generator Implant Date: 20200821

## 2018-12-22 ENCOUNTER — Inpatient Hospital Stay: Payer: Medicare Other | Admitting: Adult Health

## 2018-12-23 ENCOUNTER — Telehealth: Payer: Self-pay | Admitting: Adult Health

## 2018-12-23 NOTE — Telephone Encounter (Signed)
Patient was evaluated on 12/22/2018 through research Sleep Smart study.  He had questions regarding current use of breathalyzer due to DWI back in January.  Personal message sent to his current PCP, Dr. Forde Dandy which is provided below:  Dr. Forde Dandy, I had the pleasure of seeing our mutual patient yesterday for stroke follow-up in the research clinic.  He appears as though he has recovered well from a stroke standpoint without residual deficits.  He did question return to driving which from a stroke standpoint, no indication or concerns on return to driving.  He also questions use of his breathalyzer as he has been having difficulty using mainly due to difficulty humming into the machine.  The device he uses alcolock.  He stated he had to use the device prior to driving, 5 minutes in to driving and then every 20 minutes along with additional testing at completion of his drive.  I looked up his current device online and it does not appear as though this is accurate as far as blowing into device while his car is in motion.  He requested a letter stating that he has difficulty blowing into the device due to the "hum" and attempts to get a different type of device specifically speaking of a camera.  He states that he and the requests to provider letters stating this information.  After further consideration and discussion with my colleagues, I would not be able to provide a letter from a neurological standpoint as he does not have residual deficits from his stroke that would cause difficulty using this device.  I am not too familiar with these devices or other types of devices in this particular case.  As he gave inaccurate information during visit in regards to frequency of use, I am also concerned regarding legitimacy of why he needs these letters.  I advised him to speak with you in regards to possible need of referral to pulmonology or ENT to assess if there is a true medical reason of why he has difficulty using  device.  There is also contact information online for his current device that he may be able to get additional instructions and education for appropriate use of device.  Please let me know if you have any questions or concerns or if I can be of any further assistance. Take care, Frann Rider, [facility]  [facility] [address] [place], [place] [address] Phone [phone] Fax [phone]

## 2018-12-28 ENCOUNTER — Telehealth: Payer: Self-pay | Admitting: Adult Health

## 2018-12-28 ENCOUNTER — Encounter: Payer: Self-pay | Admitting: *Deleted

## 2018-12-28 NOTE — Telephone Encounter (Signed)
Letter taken up front to be mailed.

## 2018-12-28 NOTE — Telephone Encounter (Signed)
Letter placed in JM/NP inbox for review and signature.

## 2018-12-28 NOTE — Telephone Encounter (Signed)
I called and LMVM for pt that he may have someone pick up letter or we can mail it. Please call back.  Phone staff you may relay to pt.  ( I did read letter via VM so he would know what it says).

## 2018-12-28 NOTE — Telephone Encounter (Signed)
He was cleared to drive from a stroke standpoint.  In regards to use of breathalyzer and difficulty using, sent a note to Dr. Forde Dandy for further evaluation and potential need of referral to pulmonology or ENT for further evaluation but unable to provide a letter in regards to difficulty using breathalyzer from a neurological standpoint

## 2018-12-28 NOTE — Telephone Encounter (Signed)
Pt has called back and is asking RN Lovey Newcomer to just put the letter in the mail please

## 2018-12-28 NOTE — Telephone Encounter (Signed)
Tisha with [facility] called stating that the patient is needing a letter in order to drive. Please follow up.

## 2018-12-28 NOTE — Telephone Encounter (Signed)
Signed and placed in outbox. Thank you for providing letter.

## 2019-01-10 ENCOUNTER — Ambulatory Visit (INDEPENDENT_AMBULATORY_CARE_PROVIDER_SITE_OTHER): Payer: Medicare Other | Admitting: *Deleted

## 2019-01-10 DIAGNOSIS — Z8673 Personal history of transient ischemic attack (TIA), and cerebral infarction without residual deficits: Secondary | ICD-10-CM

## 2019-01-10 LAB — CUP PACEART REMOTE DEVICE CHECK
Date Time Interrogation Session: 20201129192236
Implantable Pulse Generator Implant Date: 20200821

## 2019-02-02 NOTE — Progress Notes (Signed)
ILR remote 

## 2019-02-10 ENCOUNTER — Ambulatory Visit (INDEPENDENT_AMBULATORY_CARE_PROVIDER_SITE_OTHER): Payer: Medicare Other | Admitting: *Deleted

## 2019-02-10 DIAGNOSIS — I63411 Cerebral infarction due to embolism of right middle cerebral artery: Secondary | ICD-10-CM | POA: Diagnosis not present

## 2019-02-10 LAB — CUP PACEART REMOTE DEVICE CHECK
Date Time Interrogation Session: 20201230230239
Implantable Pulse Generator Implant Date: 20200821

## 2019-02-24 DIAGNOSIS — E7849 Other hyperlipidemia: Secondary | ICD-10-CM | POA: Diagnosis not present

## 2019-03-14 ENCOUNTER — Ambulatory Visit (INDEPENDENT_AMBULATORY_CARE_PROVIDER_SITE_OTHER): Payer: Medicare Other | Admitting: *Deleted

## 2019-03-14 DIAGNOSIS — I63411 Cerebral infarction due to embolism of right middle cerebral artery: Secondary | ICD-10-CM

## 2019-03-14 LAB — CUP PACEART REMOTE DEVICE CHECK
Date Time Interrogation Session: 20210131230055
Implantable Pulse Generator Implant Date: 20200821

## 2019-03-14 NOTE — Progress Notes (Signed)
ILR Remote 

## 2019-03-23 DIAGNOSIS — M5416 Radiculopathy, lumbar region: Secondary | ICD-10-CM | POA: Diagnosis not present

## 2019-03-23 DIAGNOSIS — M549 Dorsalgia, unspecified: Secondary | ICD-10-CM | POA: Diagnosis not present

## 2019-03-23 DIAGNOSIS — M5412 Radiculopathy, cervical region: Secondary | ICD-10-CM | POA: Diagnosis not present

## 2019-03-23 DIAGNOSIS — I10 Essential (primary) hypertension: Secondary | ICD-10-CM | POA: Diagnosis not present

## 2019-03-23 DIAGNOSIS — M545 Low back pain: Secondary | ICD-10-CM | POA: Diagnosis not present

## 2019-04-14 ENCOUNTER — Ambulatory Visit (INDEPENDENT_AMBULATORY_CARE_PROVIDER_SITE_OTHER): Payer: Medicare Other | Admitting: *Deleted

## 2019-04-14 DIAGNOSIS — I63411 Cerebral infarction due to embolism of right middle cerebral artery: Secondary | ICD-10-CM | POA: Diagnosis not present

## 2019-04-14 DIAGNOSIS — Z23 Encounter for immunization: Secondary | ICD-10-CM | POA: Diagnosis not present

## 2019-04-14 LAB — CUP PACEART REMOTE DEVICE CHECK
Date Time Interrogation Session: 20210303230524
Implantable Pulse Generator Implant Date: 20200821

## 2019-04-14 NOTE — Progress Notes (Signed)
ILR Remote 

## 2019-05-10 DIAGNOSIS — Z23 Encounter for immunization: Secondary | ICD-10-CM | POA: Diagnosis not present

## 2019-05-16 ENCOUNTER — Ambulatory Visit (INDEPENDENT_AMBULATORY_CARE_PROVIDER_SITE_OTHER): Payer: Medicare Other | Admitting: *Deleted

## 2019-05-16 DIAGNOSIS — I63411 Cerebral infarction due to embolism of right middle cerebral artery: Secondary | ICD-10-CM

## 2019-05-16 LAB — CUP PACEART REMOTE DEVICE CHECK
Date Time Interrogation Session: 20210403230556
Implantable Pulse Generator Implant Date: 20200821

## 2019-05-17 NOTE — Progress Notes (Signed)
ILR Remote 

## 2019-07-21 DIAGNOSIS — M549 Dorsalgia, unspecified: Secondary | ICD-10-CM | POA: Diagnosis not present

## 2019-07-21 DIAGNOSIS — M5416 Radiculopathy, lumbar region: Secondary | ICD-10-CM | POA: Diagnosis not present

## 2019-08-29 ENCOUNTER — Ambulatory Visit: Payer: Medicare Other

## 2019-10-02 LAB — CUP PACEART REMOTE DEVICE CHECK
Date Time Interrogation Session: 20210818230554
Implantable Pulse Generator Implant Date: 20200821

## 2019-10-03 ENCOUNTER — Ambulatory Visit (INDEPENDENT_AMBULATORY_CARE_PROVIDER_SITE_OTHER): Payer: Medicare Other | Admitting: *Deleted

## 2019-10-03 DIAGNOSIS — I63411 Cerebral infarction due to embolism of right middle cerebral artery: Secondary | ICD-10-CM

## 2019-10-06 NOTE — Progress Notes (Signed)
Carelink Summary Report / Loop Recorder 

## 2019-10-14 DIAGNOSIS — R7301 Impaired fasting glucose: Secondary | ICD-10-CM | POA: Diagnosis not present

## 2019-10-14 DIAGNOSIS — Z125 Encounter for screening for malignant neoplasm of prostate: Secondary | ICD-10-CM | POA: Diagnosis not present

## 2019-10-14 DIAGNOSIS — E785 Hyperlipidemia, unspecified: Secondary | ICD-10-CM | POA: Diagnosis not present

## 2019-10-27 DIAGNOSIS — H409 Unspecified glaucoma: Secondary | ICD-10-CM | POA: Diagnosis not present

## 2019-10-27 DIAGNOSIS — N529 Male erectile dysfunction, unspecified: Secondary | ICD-10-CM | POA: Diagnosis not present

## 2019-10-27 DIAGNOSIS — Z23 Encounter for immunization: Secondary | ICD-10-CM | POA: Diagnosis not present

## 2019-10-27 DIAGNOSIS — R7301 Impaired fasting glucose: Secondary | ICD-10-CM | POA: Diagnosis not present

## 2019-10-27 DIAGNOSIS — N401 Enlarged prostate with lower urinary tract symptoms: Secondary | ICD-10-CM | POA: Diagnosis not present

## 2019-10-27 DIAGNOSIS — E7849 Other hyperlipidemia: Secondary | ICD-10-CM | POA: Diagnosis not present

## 2019-10-27 DIAGNOSIS — S22009S Unspecified fracture of unspecified thoracic vertebra, sequela: Secondary | ICD-10-CM | POA: Diagnosis not present

## 2019-10-27 DIAGNOSIS — R82998 Other abnormal findings in urine: Secondary | ICD-10-CM | POA: Diagnosis not present

## 2019-10-27 DIAGNOSIS — I6523 Occlusion and stenosis of bilateral carotid arteries: Secondary | ICD-10-CM | POA: Diagnosis not present

## 2019-10-27 DIAGNOSIS — F4321 Adjustment disorder with depressed mood: Secondary | ICD-10-CM | POA: Diagnosis not present

## 2019-10-27 DIAGNOSIS — I700 Atherosclerosis of aorta: Secondary | ICD-10-CM | POA: Diagnosis not present

## 2019-10-27 DIAGNOSIS — I10 Essential (primary) hypertension: Secondary | ICD-10-CM | POA: Diagnosis not present

## 2019-10-27 DIAGNOSIS — K760 Fatty (change of) liver, not elsewhere classified: Secondary | ICD-10-CM | POA: Diagnosis not present

## 2019-11-03 ENCOUNTER — Other Ambulatory Visit: Payer: Self-pay

## 2019-11-03 DIAGNOSIS — I6529 Occlusion and stenosis of unspecified carotid artery: Secondary | ICD-10-CM

## 2019-11-07 ENCOUNTER — Ambulatory Visit (INDEPENDENT_AMBULATORY_CARE_PROVIDER_SITE_OTHER): Payer: Medicare Other | Admitting: Emergency Medicine

## 2019-11-07 DIAGNOSIS — I63411 Cerebral infarction due to embolism of right middle cerebral artery: Secondary | ICD-10-CM

## 2019-11-07 LAB — CUP PACEART REMOTE DEVICE CHECK
Date Time Interrogation Session: 20210918230614
Implantable Pulse Generator Implant Date: 20200821

## 2019-11-09 NOTE — Progress Notes (Signed)
Carelink Summary Report / Loop Recorder 

## 2019-11-18 DIAGNOSIS — Z1212 Encounter for screening for malignant neoplasm of rectum: Secondary | ICD-10-CM | POA: Diagnosis not present

## 2019-11-21 ENCOUNTER — Encounter (HOSPITAL_COMMUNITY): Payer: Medicare Other

## 2019-11-21 ENCOUNTER — Ambulatory Visit: Payer: Medicare Other

## 2019-11-30 ENCOUNTER — Ambulatory Visit (INDEPENDENT_AMBULATORY_CARE_PROVIDER_SITE_OTHER): Payer: Medicare Other

## 2019-11-30 DIAGNOSIS — I63411 Cerebral infarction due to embolism of right middle cerebral artery: Secondary | ICD-10-CM

## 2019-12-01 LAB — CUP PACEART REMOTE DEVICE CHECK
Date Time Interrogation Session: 20211019230051
Implantable Pulse Generator Implant Date: 20200821

## 2019-12-06 NOTE — Progress Notes (Signed)
Carelink Summary Report / Loop Recorder 

## 2019-12-13 ENCOUNTER — Encounter: Payer: Self-pay | Admitting: Physician Assistant

## 2019-12-13 ENCOUNTER — Other Ambulatory Visit: Payer: Self-pay

## 2019-12-13 ENCOUNTER — Ambulatory Visit (HOSPITAL_COMMUNITY)
Admission: RE | Admit: 2019-12-13 | Discharge: 2019-12-13 | Disposition: A | Discharge status: Home / Self Care | Payer: Medicare Other | Source: Ambulatory Visit | Attending: Surgery | Admitting: Surgery

## 2019-12-13 ENCOUNTER — Ambulatory Visit (INDEPENDENT_AMBULATORY_CARE_PROVIDER_SITE_OTHER): Payer: Medicare Other | Admitting: Physician Assistant

## 2019-12-13 VITALS — BP 125/75 | HR 80 | Temp 98.7°F | Resp 20 | Ht 70.0 in | Wt 182.6 lb

## 2019-12-13 DIAGNOSIS — I6529 Occlusion and stenosis of unspecified carotid artery: Secondary | ICD-10-CM

## 2019-12-13 NOTE — Progress Notes (Signed)
Office Note     CC:  follow up Requesting Provider:  Reynold Bowen, MD  HPI: Shaun Ball is a 69 y.o. (29-Jul-1950) male who presents for follow up of carotid stenosis. He has history of right brain embolic stroke August 4235.  He had some slurring of his speech.  He had an extensive work-up which showed a less than 50% right internal carotid artery stenosis by CTA.  This was 40 to 60% by duplex.  Contralateral side had no significant stenosis.  He has not had any further neurologic events since August of 2020.  He denies any symptoms of TIA or stroke.  His speech has returned to normal. He is currently on Repatha for hyperlipidemia. Feels that it is really helping him and that he could tell a big difference in how he feels after starting it. He denies any lower extremity symptoms such as claudication or rest pain.  The pt is not on a statin for cholesterol management. Unable to tolerate secondary to rash. On Repatha The pt is on a daily aspirin.   Other AC: Plavix The pt is on ACE for hypertension.   The pt is not diabetic.  Tobacco hx: Former, quit 2001  Past Medical History:  Diagnosis Date  . Arthritis   . Bladder cancer ([place])   . CVA (cerebral vascular accident) ([place]) 09/2018  . History of kidney stones 2012   passed / has renal cyst to evaluated at later time  . Hypertension   . Wears glasses     Past Surgical History:  Procedure Laterality Date  . BUBBLE STUDY  10/01/2018   Procedure: BUBBLE STUDY;  Surgeon: Fay Records, MD;  Location: [place];  Service: Cardiovascular;;  . CYSTOSCOPY N/A 11/05/2016   Procedure: Consuela Mimes;  Surgeon: Ceasar Mons, MD;  Location: [facility];  Service: Urology;  Laterality: N/A;  . CYSTOSCOPY WITH BIOPSY Bilateral 10/24/2016   Procedure: CYSTOSCOPY WITH BLADDER BIOPSY/ BILATERAL RETROGRADE [id number];  Surgeon: Ceasar Mons, MD;  Location: [facility];  Service: Urology;   Laterality: Bilateral;  . CYSTOSCOPY WITH BIOPSY N/A 05/12/2018   Procedure: CYSTOSCOPY WITH BLADDER BIOPSY/ BILATERAL RETROGRADE PYELOGRAM BIOPSY;  Surgeon: Ceasar Mons, MD;  Location: [facility];  Service: Urology;  Laterality: N/A;  . 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 fattytumorremoved from back     . LOOP RECORDER INSERTION N/A 10/01/2018   Procedure: LOOP RECORDER INSERTION;  Surgeon: Constance Haw, MD;  Location: [place] [facility];  Service: Cardiovascular;  Laterality: N/A;  . TEE WITHOUT CARDIOVERSION N/A 10/01/2018   Procedure: TRANSESOPHAGEAL ECHOCARDIOGRAM (TEE);  Surgeon: Fay Records, MD;  Location: [place];  Service: Cardiovascular;  Laterality: N/A;  . TRANSURETHRAL RESECTION OF BLADDER TUMOR N/A 11/05/2016   Procedure: TRANSURETHRAL RESECTION OF BLADDER TUMOR (TURBT);  Surgeon: Ceasar Mons, MD;  Location[facility];  Service: Urology;  Laterality: N/A;    Social History   Socioeconomic History  . Marital status: Legally Separated    Spouse name: Not on file  . Number of children: Not on file  . Years of education: Not on file  . Highest education level: Not on file  Occupational History  . Occupation: retired  Tobacco Use  . Smoking status: Former Smoker    Years: 28.00    Types: Cigarettes    Quit date: 10/22/1999    Years since  quitting: 20.1  . Smokeless tobacco: Never Used  Vaping Use  . Vaping Use: Never used  Substance and Sexual Activity  . Alcohol use: Yes    Alcohol/week: 7.0 standard drinks    Types: 7 Cans of beer per week    Comment: not daily drinking but usually drinks a 6-pack when he does drinkl; denies a problem with ETOH  . Drug use: No  . Sexual activity: Not on file  Other Topics Concern  . Not on file  Social History Narrative   Mr Shaun Ball a 69 year old retired legally separated male He  lives at home and has the assistance of his son and Shaun Ball    Social Determinants of Health   Financial Resource Strain:   . Difficulty of Paying Living Expenses: Not on file  Food Insecurity:   . Worried About Charity fundraiser in the Last Year: Not on file  . Ran Out of Food in the Last Year: Not on file  Transportation Needs:   . Lack of Transportation (Medical): Not on file  . Lack of Transportation (Non-Medical): Not on file  Physical Activity:   . Days of Exercise per Week: Not on file  . Minutes of Exercise per Session: Not on file  Stress:   . Feeling of Stress : Not on file  Social Connections:   . Frequency of Communication with Friends and Family: Not on file  . Frequency of Social Gatherings with Friends and Family: Not on file  . Attends Religious Services: Not on file  . Active Member of Clubs or Organizations: Not on file  . Attends Archivist Meetings: Not on file  . Marital Status: Not on file  Intimate Partner Violence:   . Fear of Current or Ex-Partner: Not on file  . Emotionally Abused: Not on file  . Physically Abused: Not on file  . Sexually Abused: Not on file    Family History  Problem Relation Age of Onset  . Blindness Mother   . Cataracts Mother   . Diabetes Mother   . Glaucoma Mother   . Macular degeneration Mother   . Cataracts Father   . Diabetes Father   . Diabetes Sister   . Diabetes Brother   . Blindness Maternal Uncle   . Macular degeneration Maternal Uncle   . Macular degeneration Paternal Aunt   . Amblyopia Neg Hx   . Retinal detachment Neg Hx   . Strabismus Neg Hx   . Retinitis pigmentosa Neg Hx     Current Outpatient Medications  Medication Sig Dispense Refill  . acetaminophen (TYLENOL) 500 MG tablet Take 500 mg by mouth every 6 (six) hours as needed for moderate pain.    Marland Kitchen aspirin 81 MG chewable tablet Chew 1 tablet (81 mg total) by mouth daily. 60 tablet 1  . fluticasone (FLONASE) 50 MCG/ACT nasal spray Place  1 spray into both nostrils as needed for allergies or rhinitis.    Marland Kitchen lisinopril (PRINIVIL,ZESTRIL) 40 MG tablet Take 40 mg by mouth every evening.     . Melatonin 5 MG TABS Take 5 mg by mouth at bedtime.    . methocarbamol (ROBAXIN) 500 MG tablet     . montelukast (SINGULAIR) 10 MG tablet Take 10 mg by mouth daily as needed (allergies).   6  . REPATHA SURECLICK 409 MG/ML SOAJ     . tamsulosin (FLOMAX) 0.4 MG CAPS capsule Take 1 capsule (0.4 mg total) by mouth daily. (Patient  taking differently: Take 0.4 mg by mouth daily after breakfast. ) 30 capsule 11   No current facility-administered medications for this visit.    Allergies  Allergen Reactions  . Statins Hives and Rash    whelps,liver enzymes elevated  . Iohexol Other (See Comments)    Pt had sneezing after his CT with IV contrast, lasting only 10- 15 minutes, no intervention was needed, per radiologist     REVIEW OF SYSTEMS:  [X]  denotes positive finding, [ ]  denotes negative finding Cardiac  Comments:  Chest pain or chest pressure:    Shortness of breath upon exertion:    Short of breath when lying flat:    Irregular heart rhythm:        Vascular    Pain in calf, thigh, or hip brought on by ambulation:    Pain in feet at night that wakes you up from your sleep:     Blood clot in your veins:    Leg swelling:         Pulmonary    Oxygen at home:    Productive cough:     Wheezing:         Neurologic    Sudden weakness in arms or legs:     Sudden numbness in arms or legs:     Sudden onset of difficulty speaking or slurred speech:    Temporary loss of vision in one eye:     Problems with dizziness:         Gastrointestinal    Blood in stool:     Vomited blood:         Genitourinary    Burning when urinating:     Blood in urine:        Psychiatric    Major depression:         Hematologic    Bleeding problems:    Problems with blood clotting too easily:        Skin    Rashes or ulcers:          Constitutional    Fever or chills:      PHYSICAL EXAMINATION:  Vitals:   12/13/19 1041 12/13/19 1043  BP: 126/70 125/75  Pulse: 80   Resp: 20   Temp: 98.7 F (37.1 C)   TempSrc: Temporal   SpO2: 98%   Weight: 182 lb 9.6 oz (82.8 kg)   Height: 5\' 10"  (1.778 m)     General:  WDWN in NAD; vital signs documented above Gait: Normal HENT: WNL, normocephalic Pulmonary: normal non-labored breathing , without Rales, rhonchi,  wheezing Cardiac: regular HR, without  Murmurs without carotid bruit Vascular Exam/Pulses:2+ radial pulses bilaterally, palpable DP and PT pulses bilaterally Extremities: without ischemic changes, without Gangrene , without cellulitis; without open wounds;  Musculoskeletal: no muscle wasting or atrophy  Neurologic: A&O X 3;  No focal weakness or paresthesias are detected Psychiatric:  The pt has Normal affect.   Non-Invasive Vascular Imaging:  Bilateral Carotid Duplex 12/13/19 Right Carotid: Velocities in the right ICA are consistent with a 40-59% stenosis.   Left Carotid: Velocities in the left ICA are consistent with a 1-39% stenosis.   Subclavians: Right subclavian artery was stenotic. Normal flow hemodynamics were seen in the left subclavian artery.    ASSESSMENT/PLAN:: 69 y.o. male here for follow up for carotid stenosis. Moderate right internal carotid artery stenosis 40-59%. Duplex is essentially unchanged from August 2020. No further stroke symptoms. He currently is on Repatha for lipid  management and continues to take Aspirin 81 mg.  - reviewed signs/ symptoms of TIA and stroke and advised him should these occur to seek immediate medical attention - he will follow up sooner if he develops new neurological symptoms - he will follow up in 1 year with repeat carotid duplex   Karoline Caldwell, PA-C [facility] [phone]  Clinic MD: Dr. Carlis Abbott

## 2019-12-15 DIAGNOSIS — Z23 Encounter for immunization: Secondary | ICD-10-CM | POA: Diagnosis not present

## 2019-12-31 LAB — CUP PACEART REMOTE DEVICE CHECK
Date Time Interrogation Session: 20211119230145
Implantable Pulse Generator Implant Date: 20200821

## 2020-01-02 ENCOUNTER — Ambulatory Visit (INDEPENDENT_AMBULATORY_CARE_PROVIDER_SITE_OTHER): Payer: Medicare Other

## 2020-01-02 DIAGNOSIS — I63411 Cerebral infarction due to embolism of right middle cerebral artery: Secondary | ICD-10-CM | POA: Diagnosis not present

## 2020-01-03 NOTE — Progress Notes (Signed)
Carelink Summary Report / Loop Recorder 

## 2020-01-31 ENCOUNTER — Ambulatory Visit (INDEPENDENT_AMBULATORY_CARE_PROVIDER_SITE_OTHER): Payer: Medicare Other

## 2020-01-31 DIAGNOSIS — I63411 Cerebral infarction due to embolism of right middle cerebral artery: Secondary | ICD-10-CM | POA: Diagnosis not present

## 2020-01-31 LAB — CUP PACEART REMOTE DEVICE CHECK
Date Time Interrogation Session: 20211220230245
Implantable Pulse Generator Implant Date: 20200821

## 2020-02-14 NOTE — Progress Notes (Signed)
Carelink Summary Report / Loop Recorder 

## 2020-03-02 ENCOUNTER — Ambulatory Visit (INDEPENDENT_AMBULATORY_CARE_PROVIDER_SITE_OTHER): Payer: No Typology Code available for payment source

## 2020-03-02 DIAGNOSIS — I63411 Cerebral infarction due to embolism of right middle cerebral artery: Secondary | ICD-10-CM | POA: Diagnosis not present

## 2020-03-02 LAB — CUP PACEART REMOTE DEVICE CHECK
Date Time Interrogation Session: 20220120230421
Implantable Pulse Generator Implant Date: 20200821

## 2020-03-14 NOTE — Progress Notes (Signed)
Carelink Summary Report / Loop Recorder 

## 2020-04-02 ENCOUNTER — Ambulatory Visit (INDEPENDENT_AMBULATORY_CARE_PROVIDER_SITE_OTHER): Payer: Medicare PPO

## 2020-04-02 DIAGNOSIS — I63411 Cerebral infarction due to embolism of right middle cerebral artery: Secondary | ICD-10-CM | POA: Diagnosis not present

## 2020-04-02 LAB — CUP PACEART REMOTE DEVICE CHECK
Date Time Interrogation Session: 20220220230659
Implantable Pulse Generator Implant Date: 20200821

## 2020-04-05 ENCOUNTER — Telehealth (HOSPITAL_COMMUNITY): Payer: Self-pay | Admitting: Physician Assistant

## 2020-04-05 NOTE — Telephone Encounter (Signed)
Called and left message for patient to call back to schedule appt with AFib Clinic. 

## 2020-04-05 NOTE — Telephone Encounter (Signed)
-----   Message from Stanton Kidney, RN sent at 04/05/2020 10:20 AM EST ----- Informed patient of results and verbal understanding expressed. Pt aware that I will forward this to AFib clinic to arrange recommended OV to discuss findings, perform EKG and if DOAC needs to be started.  Pt agreeable.

## 2020-04-05 NOTE — Telephone Encounter (Signed)
Patient returned my call and is aware of appt 04/06/2020 at 11 am with Adline Peals, PA.

## 2020-04-06 ENCOUNTER — Other Ambulatory Visit: Payer: Self-pay

## 2020-04-06 ENCOUNTER — Ambulatory Visit (HOSPITAL_COMMUNITY)
Admission: RE | Admit: 2020-04-06 | Discharge: 2020-04-06 | Disposition: A | Discharge status: Home / Self Care | Payer: Medicare PPO | Source: Ambulatory Visit | Attending: Physician Assistant | Admitting: Physician Assistant

## 2020-04-06 ENCOUNTER — Encounter (HOSPITAL_COMMUNITY): Payer: Self-pay | Admitting: Physician Assistant

## 2020-04-06 VITALS — BP 126/76 | HR 68 | Ht 70.0 in | Wt 184.2 lb

## 2020-04-06 DIAGNOSIS — Z8673 Personal history of transient ischemic attack (TIA), and cerebral infarction without residual deficits: Secondary | ICD-10-CM | POA: Insufficient documentation

## 2020-04-06 DIAGNOSIS — Z7982 Long term (current) use of aspirin: Secondary | ICD-10-CM | POA: Diagnosis not present

## 2020-04-06 DIAGNOSIS — I4892 Unspecified atrial flutter: Secondary | ICD-10-CM | POA: Insufficient documentation

## 2020-04-06 DIAGNOSIS — D6869 Other thrombophilia: Secondary | ICD-10-CM

## 2020-04-06 DIAGNOSIS — I10 Essential (primary) hypertension: Secondary | ICD-10-CM | POA: Insufficient documentation

## 2020-04-06 DIAGNOSIS — Z960 Presence of urogenital implants: Secondary | ICD-10-CM | POA: Diagnosis not present

## 2020-04-06 DIAGNOSIS — Z95818 Presence of other cardiac implants and grafts: Secondary | ICD-10-CM | POA: Insufficient documentation

## 2020-04-06 DIAGNOSIS — Z79899 Other long term (current) drug therapy: Secondary | ICD-10-CM | POA: Diagnosis not present

## 2020-04-06 DIAGNOSIS — Z87891 Personal history of nicotine dependence: Secondary | ICD-10-CM | POA: Insufficient documentation

## 2020-04-06 DIAGNOSIS — Z8551 Personal history of malignant neoplasm of bladder: Secondary | ICD-10-CM | POA: Insufficient documentation

## 2020-04-06 DIAGNOSIS — Z888 Allergy status to other drugs, medicaments and biological substances status: Secondary | ICD-10-CM | POA: Diagnosis not present

## 2020-04-06 MED ORDER — APIXABAN 5 MG PO TABS: 5.0000 mg | ORAL_TABLET | Freq: Two times a day (BID) | ORAL | 3 refills | Status: DC

## 2020-04-06 NOTE — Patient Instructions (Signed)
Stop aspirin  Start Eliquis 5 mg twice a day.

## 2020-04-06 NOTE — Progress Notes (Signed)
Primary Care Physician: Reynold Bowen, MD Primary Electrophysiologist: Dr Curt Bears Referring Physician: Dr Hali Marry is a 70 y.o. male with a history of HTN, bladder cancer, prior cryptogenic stroke with ILR implant 09/2018, and atrial flutter who presents for follow up in the [facility]. The patient was initially diagnosed with atrial flutter 04/01/20 on his ILR. Patient has a CHADS2VASC score of 4. He denies any awareness of the arrhythmia. He denies significant snoring or alcohol use.   Today, he denies symptoms of palpitations, chest pain, shortness of breath, orthopnea, PND, lower extremity edema, dizziness, presyncope, syncope, snoring, daytime somnolence, bleeding, or neurologic sequela. The patient is tolerating medications without difficulties and is otherwise without complaint today.    Atrial Fibrillation Risk Factors:  he does not have symptoms or diagnosis of sleep apnea. he does not have a history of rheumatic fever. he does not have a history of alcohol use. The patient does not have a history of early familial atrial fibrillation or other arrhythmias.  he has a BMI of Body mass index is 26.43 kg/m.Marland Kitchen Filed Weights   04/06/20 1116  Weight: 83.6 kg    Family History  Problem Relation Age of Onset  . Blindness Mother   . Cataracts Mother   . Diabetes Mother   . Glaucoma Mother   . Macular degeneration Mother   . Cataracts Father   . Diabetes Father   . Diabetes Sister   . Diabetes Brother   . Blindness Maternal Uncle   . Macular degeneration Maternal Uncle   . Macular degeneration Paternal Aunt   . Amblyopia Neg Hx   . Retinal detachment Neg Hx   . Strabismus Neg Hx   . Retinitis pigmentosa Neg Hx      Atrial Fibrillation Management history:  Previous antiarrhythmic drugs: none Previous cardioversions: none Previous ablations: none CHADS2VASC score: 4 Anticoagulation history: none   Past Medical History:   Diagnosis Date  . Arthritis   . Bladder cancer ([place])   . CVA (cerebral vascular accident) ([place]) 09/2018  . History of kidney stones 2012   passed / has renal cyst to evaluated at later time  . Hypertension   . Wears glasses    Past Surgical History:  Procedure Laterality Date  . BUBBLE STUDY  10/01/2018   Procedure: BUBBLE STUDY;  Surgeon: Fay Records, MD;  Location: [place];  Service: Cardiovascular;;  . CYSTOSCOPY N/A 11/05/2016   Procedure: Consuela Mimes;  Surgeon: Ceasar Mons, MD;  Location: [facility];  Service: Urology;  Laterality: N/A;  . CYSTOSCOPY WITH BIOPSY Bilateral 10/24/2016   Procedure: CYSTOSCOPY WITH BLADDER BIOPSY/ BILATERAL RETROGRADE [id number];  Surgeon: Ceasar Mons, MD;  Location: [facility];  Service: Urology;  Laterality: Bilateral;  . CYSTOSCOPY WITH BIOPSY N/A 05/12/2018   Procedure: CYSTOSCOPY WITH BLADDER BIOPSY/ BILATERAL RETROGRADE PYELOGRAM BIOPSY;  Surgeon: Ceasar Mons, MD;  [facility];  Service: Urology;  Laterality: N/A;  . 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 fattytumorremoved from back     . LOOP RECORDER INSERTION N/A 10/01/2018   Procedure: LOOP RECORDER INSERTION;  Surgeon: Constance Haw, MD;  Location: [place] [facility];  Service: Cardiovascular;  Laterality: N/A;  . TEE WITHOUT CARDIOVERSION N/A 10/01/2018   Procedure: TRANSESOPHAGEAL ECHOCARDIOGRAM (TEE);  Surgeon: Fay Records, MD;  Location: [place];  Service: Cardiovascular;  Laterality: N/A;  . TRANSURETHRAL RESECTION OF BLADDER TUMOR N/A 11/05/2016   Procedure: TRANSURETHRAL RESECTION OF BLADDER TUMOR (TURBT);  Surgeon: Ceasar Mons, MD;  Location: [facility];  Service: Urology;  Laterality: N/A;    Current Outpatient Medications  Medication Sig Dispense Refill  . acetaminophen  (TYLENOL) 500 MG tablet Take 500 mg by mouth every 6 (six) hours as needed for moderate pain.    Marland Kitchen aspirin 81 MG chewable tablet Chew 1 tablet (81 mg total) by mouth daily. 60 tablet 1  . fluticasone (FLONASE) 50 MCG/ACT nasal spray Place 1 spray into both nostrils as needed for allergies or rhinitis.    Marland Kitchen lisinopril (PRINIVIL,ZESTRIL) 40 MG tablet Take 40 mg by mouth every evening.     . Melatonin 5 MG TABS Take 5 mg by mouth at bedtime.    . methocarbamol (ROBAXIN) 500 MG tablet     . montelukast (SINGULAIR) 10 MG tablet Take 10 mg by mouth daily as needed (allergies).   6  . REPATHA SURECLICK 119 MG/ML SOAJ     . tamsulosin (FLOMAX) 0.4 MG CAPS capsule Take 1 capsule (0.4 mg total) by mouth daily. 30 capsule 11   No current facility-administered medications for this encounter.    Allergies  Allergen Reactions  . Statins Hives and Rash    whelps,liver enzymes elevated  . Iohexol Other (See Comments)    Pt had sneezing after his CT with IV contrast, lasting only 10- 15 minutes, no intervention was needed, per radiologist    Social History   Socioeconomic History  . Marital status: Legally Separated    Spouse name: Not on file  . Number of children: Not on file  . Years of education: Not on file  . Highest education level: Not on file  Occupational History  . Occupation: retired  Tobacco Use  . Smoking status: Former Smoker    Years: 28.00    Types: Cigarettes    Quit date: 10/22/1999    Years since quitting: 20.4  . Smokeless tobacco: Never Used  Vaping Use  . Vaping Use: Never used  Substance and Sexual Activity  . Alcohol use: Yes    Alcohol/week: 7.0 standard drinks    Types: 7 Cans of beer per week    Comment: not daily drinking but usually drinks a 6-pack when he does drinkl; denies a problem with ETOH  . Drug use: No  . Sexual activity: Not on file  Other Topics Concern  . Not on file  Social History Narrative   [name] a 70 year old retired  legally separated male He lives at home and has the assistance of his son and Margreta Journey    Social Determinants of Health   Financial Resource Strain: Not on file  Food Insecurity: Not on file  Transportation Needs: Not on file  Physical Activity: Not on file  Stress: Not on file  Social Connections: Not on file  Intimate Partner Violence: Not on file     ROS- All systems are reviewed and negative except as per the HPI above.  Physical Exam: Vitals:   04/06/20 1116  BP: 126/76  Pulse: 68  Weight: 83.6 kg  Height: 5\' 10"  (1.778 m)    GEN- The patient is well appearing, alert and oriented x 3 today.   Head- normocephalic, atraumatic Eyes-  Sclera clear, conjunctiva pink Ears- hearing intact Oropharynx- clear Neck- supple  Lungs- Clear to ausculation bilaterally, normal work of breathing Heart-  Regular rate and rhythm, no murmurs, rubs or gallops  GI- soft, NT, ND, + BS Extremities- no clubbing, cyanosis, or edema MS- no significant deformity or atrophy Skin- no rash or lesion Psych- euthymic mood, full affect Neuro- strength and sensation are intact  Wt Readings from Last 3 Encounters:  04/06/20 83.6 kg  12/13/19 82.8 kg  11/18/18 82.1 kg    EKG today demonstrates  SR Vent. rate 68 BPM PR interval 192 ms QRS duration 82 ms QT/QTc 378/401 ms  Echo 09/29/18 demonstrated  1. The left ventricle has normal systolic function with an ejection  fraction of 60-65%. The cavity size was normal. Left ventricular diastolic  parameters were normal.  2. The right ventricle has normal systolic function. The cavity was  normal. There is no increase in right ventricular wall thickness.  3. Mild thickening of the mitral valve leaflet.  4. The aortic valve is tricuspid. Mild thickening of the aortic valve.  Sclerosis without any evidence of stenosis of the aortic valve.  5. The aorta is normal unless otherwise noted.   Epic records are reviewed at length  today  CHA2DS2-VASc Score = 4  The patient's score is based upon: CHF History: No HTN History: Yes Diabetes History: No Stroke History: Yes Vascular Disease History: No Age Score: 1 Gender Score: 0      ASSESSMENT AND PLAN: 1. Atrial flutter The patient's CHA2DS2-VASc score is 4, indicating a 4.8% annual risk of stroke.   General education about atrial flutter provided and questions answered. We also discussed his stroke risk and the risks and benefits of anticoagulation. Will stop ASA and start Eliquis 5 mg BID.  2. Secondary Hypercoagulable State (ICD10:  D68.69) The patient is at significant risk for stroke/thromboembolism based upon his CHA2DS2-VASc Score of 4.  Start Apixaban (Eliquis).   3. HTN Stable, no changes today.   Follow up in the AF clinic in one month.    [facility] [address] [place], [place] [address] [phone] 04/06/2020 11:20 AM

## 2020-04-09 NOTE — Progress Notes (Signed)
Carelink Summary Report / Loop Recorder 

## 2020-05-03 ENCOUNTER — Ambulatory Visit (INDEPENDENT_AMBULATORY_CARE_PROVIDER_SITE_OTHER): Payer: Medicare PPO

## 2020-05-03 DIAGNOSIS — I63411 Cerebral infarction due to embolism of right middle cerebral artery: Secondary | ICD-10-CM | POA: Diagnosis not present

## 2020-05-03 LAB — CUP PACEART REMOTE DEVICE CHECK
Date Time Interrogation Session: 20220323230510
Implantable Pulse Generator Implant Date: 20200821

## 2020-05-09 ENCOUNTER — Ambulatory Visit (HOSPITAL_COMMUNITY): Payer: Medicare PPO | Admitting: Physician Assistant

## 2020-05-15 NOTE — Progress Notes (Signed)
Carelink Summary Report / Loop Recorder 

## 2020-05-22 NOTE — Progress Notes (Signed)
Primary Care Physician: Reynold Bowen, MD Primary Electrophysiologist: Dr Curt Bears Referring Physician: Dr Hali Marry is a 70 y.o. male with a history of HTN, bladder cancer, prior cryptogenic stroke with ILR implant 09/2018, and atrial flutter who presents for follow up in the [facility]. The patient was initially diagnosed with atrial flutter 04/01/20 on his ILR. Patient has a CHADS2VASC score of 4. He denies any awareness of the arrhythmia. He denies significant snoring or alcohol use.   On follow up today, patient reports he has done well since his last visit. His last ILR transmission showed no new episodes. He did have a nose bleed this morning but it resolved quickly.   Today, he denies symptoms of palpitations, chest pain, shortness of breath, orthopnea, PND, lower extremity edema, dizziness, presyncope, syncope, snoring, daytime somnolence, bleeding, or neurologic sequela. The patient is tolerating medications without difficulties and is otherwise without complaint today.    Atrial Fibrillation Risk Factors:  he does not have symptoms or diagnosis of sleep apnea. he does not have a history of rheumatic fever. he does not have a history of alcohol use. The patient does not have a history of early familial atrial fibrillation or other arrhythmias.  he has a BMI of Body mass index is 26.03 kg/m.Marland Kitchen Filed Weights   05/23/20 1052  Weight: 82.3 kg    Family History  Problem Relation Age of Onset  . Blindness Mother   . Cataracts Mother   . Diabetes Mother   . Glaucoma Mother   . Macular degeneration Mother   . Cataracts Father   . Diabetes Father   . Diabetes Sister   . Diabetes Brother   . Blindness Maternal Uncle   . Macular degeneration Maternal Uncle   . Macular degeneration Paternal Aunt   . Amblyopia Neg Hx   . Retinal detachment Neg Hx   . Strabismus Neg Hx   . Retinitis pigmentosa Neg Hx      Atrial Fibrillation  Management history:  Previous antiarrhythmic drugs: none Previous cardioversions: none Previous ablations: none CHADS2VASC score: 4 Anticoagulation history: Eliquis   Past Medical History:  Diagnosis Date  . Arthritis   . Bladder cancer ([place])   . CVA (cerebral vascular accident) ([place]) 09/2018  . History of kidney stones 2012   passed / has renal cyst to evaluated at later time  . Hypertension   . Wears glasses    Past Surgical History:  Procedure Laterality Date  . BUBBLE STUDY  10/01/2018   Procedure: BUBBLE STUDY;  Surgeon: Fay Records, MD;  Location: [place];  Service: Cardiovascular;;  . CYSTOSCOPY N/A 11/05/2016   Procedure: Consuela Mimes;  Surgeon: Ceasar Mons, MD;  Location: [facility];  Service: Urology;  Laterality: N/A;  . CYSTOSCOPY WITH BIOPSY Bilateral 10/24/2016   Procedure: CYSTOSCOPY WITH BLADDER BIOPSY/ BILATERAL RETROGRADE [id number];  Surgeon: Ceasar Mons, MD;  Location: [facility];  Service: Urology;  Laterality: Bilateral;  . CYSTOSCOPY WITH BIOPSY N/A 05/12/2018   Procedure: CYSTOSCOPY WITH BLADDER BIOPSY/ BILATERAL RETROGRADE PYELOGRAM BIOPSY;  Surgeon: Ceasar Mons, MD;  Location: [facility];  Service: Urology;  Laterality: N/A;  . 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 fattytumorremoved from back     . LOOP RECORDER INSERTION N/A 10/01/2018   Procedure: LOOP RECORDER INSERTION;  Surgeon: Constance Haw, MD;  Location: [facility]  INVASIVE CV LAB;  Service: Cardiovascular;  Laterality: N/A;  . TEE WITHOUT CARDIOVERSION N/A 10/01/2018   Procedure: TRANSESOPHAGEAL ECHOCARDIOGRAM (TEE);  Surgeon: Fay Records, MD;  Location: [place];  Service: Cardiovascular;  Laterality: N/A;  . TRANSURETHRAL RESECTION OF BLADDER TUMOR N/A 11/05/2016   Procedure: TRANSURETHRAL RESECTION OF BLADDER TUMOR (TURBT);  Surgeon:  Ceasar Mons, MD;  Location: [facility];  Service: Urology;  Laterality: N/A;    Current Outpatient Medications  Medication Sig Dispense Refill  . acetaminophen (TYLENOL) 500 MG tablet Take 500 mg by mouth every 6 (six) hours as needed for moderate pain.    Marland Kitchen apixaban (ELIQUIS) 5 MG TABS tablet Take 1 tablet (5 mg total) by mouth 2 (two) times daily. 60 tablet 3  . diphenhydrAMINE (BENADRYL) 25 mg capsule Take 25 mg by mouth every 6 (six) hours as needed for allergies.    . fluticasone (FLONASE) 50 MCG/ACT nasal spray Place 1 spray into both nostrils as needed for allergies or rhinitis.    Marland Kitchen lisinopril (PRINIVIL,ZESTRIL) 40 MG tablet Take 40 mg by mouth every evening.     . Melatonin 5 MG TABS Take 5 mg by mouth at bedtime.    . methocarbamol (ROBAXIN) 500 MG tablet     . montelukast (SINGULAIR) 10 MG tablet Take 10 mg by mouth daily as needed (allergies).   6  . REPATHA SURECLICK 329 MG/ML SOAJ     . sildenafil (VIAGRA) 100 MG tablet Take 100 mg by mouth daily as needed for erectile dysfunction.    . tamsulosin (FLOMAX) 0.4 MG CAPS capsule Take 1 capsule (0.4 mg total) by mouth daily. 30 capsule 11   No current facility-administered medications for this encounter.    Allergies  Allergen Reactions  . Statins Hives and Rash    whelps,liver enzymes elevated  . Iohexol Other (See Comments)    Pt had sneezing after his CT with IV contrast, lasting only 10- 15 minutes, no intervention was needed, per radiologist    Social History   Socioeconomic History  . Marital status: Legally Separated    Spouse name: Not on file  . Number of children: Not on file  . Years of education: Not on file  . Highest education level: Not on file  Occupational History  . Occupation: retired  Tobacco Use  . Smoking status: Former Smoker    Years: 28.00    Types: Cigarettes    Quit date: 10/22/1999    Years since quitting: 20.6  . Smokeless tobacco: Never Used  Vaping Use   . Vaping Use: Never used  Substance and Sexual Activity  . Alcohol use: Yes    Alcohol/week: 7.0 standard drinks    Types: 7 Cans of beer per week    Comment: not daily drinking but usually drinks a 6-pack when he does drinkl; denies a problem with ETOH  . Drug use: No  . Sexual activity: Not on file  Other Topics Concern  . Not on file  Social History Narrative   [name] a 70 year old retired legally separated male He lives at home and has the assistance of his son and Margreta Journey    Social Determinants of Health   Financial Resource Strain: Not on file  Food Insecurity: Not on file  Transportation Needs: Not on file  Physical Activity: Not on file  Stress: Not on file  Social Connections: Not on file  Intimate Partner Violence: Not on file  ROS- All systems are reviewed and negative except as per the HPI above.  Physical Exam: Vitals:   05/23/20 1052  BP: 118/74  Pulse: 71  Weight: 82.3 kg  Height: 5\' 10"  (1.778 m)    GEN- The patient is a well appearing  male, alert and oriented x 3 today.   HEENT-head normocephalic, atraumatic, sclera clear, conjunctiva pink, hearing intact, trachea midline. Lungs- Clear to ausculation bilaterally, normal work of breathing Heart- Regular rate and rhythm, no murmurs, rubs or gallops  GI- soft, NT, ND, + BS Extremities- no clubbing, cyanosis, or edema MS- no significant deformity or atrophy Skin- no rash or lesion Psych- euthymic mood, full affect Neuro- strength and sensation are intact   Wt Readings from Last 3 Encounters:  05/23/20 82.3 kg  04/06/20 83.6 kg  12/13/19 82.8 kg    EKG today demonstrates  SR Vent. rate 71 BPM PR interval 166 ms QRS duration 86 ms QT/QTcB 376/408 ms  Echo 09/29/18 demonstrated  1. The left ventricle has normal systolic function with an ejection  fraction of 60-65%. The cavity size was normal. Left ventricular diastolic  parameters were normal.  2. The right ventricle  has normal systolic function. The cavity was  normal. There is no increase in right ventricular wall thickness.  3. Mild thickening of the mitral valve leaflet.  4. The aortic valve is tricuspid. Mild thickening of the aortic valve.  Sclerosis without any evidence of stenosis of the aortic valve.  5. The aorta is normal unless otherwise noted.   Epic records are reviewed at length today  CHA2DS2-VASc Score = 4  The patient's score is based upon: CHF History: No HTN History: Yes Diabetes History: No Stroke History: Yes Vascular Disease History: No Age Score: 1 Gender Score: 0      ASSESSMENT AND PLAN: 1. Atrial flutter The patient's CHA2DS2-VASc score is 4, indicating a 4.8% annual risk of stroke.   Patient appears to be maintaining SR, no new episodes seen on device. Continue Eliquis 5 mg BID Check bmet/CBC  2. Secondary Hypercoagulable State (ICD10:  D68.69) The patient is at significant risk for stroke/thromboembolism based upon his CHA2DS2-VASc Score of 4.  Continue Apixaban (Eliquis).   3. HTN Stable, no changes today.   Follow up in the AF clinic in 6 months.    [facility] [address] [place], [place] [address] [phone] 05/23/2020 11:06 AM

## 2020-05-23 ENCOUNTER — Other Ambulatory Visit: Payer: Self-pay

## 2020-05-23 ENCOUNTER — Ambulatory Visit (HOSPITAL_COMMUNITY)
Admission: RE | Admit: 2020-05-23 | Discharge: 2020-05-23 | Disposition: A | Discharge status: Home / Self Care | Payer: Medicare PPO | Source: Ambulatory Visit | Attending: Physician Assistant | Admitting: Physician Assistant

## 2020-05-23 ENCOUNTER — Encounter (HOSPITAL_COMMUNITY): Payer: Self-pay | Admitting: Physician Assistant

## 2020-05-23 VITALS — BP 118/74 | HR 71 | Ht 70.0 in | Wt 181.4 lb

## 2020-05-23 DIAGNOSIS — Z8551 Personal history of malignant neoplasm of bladder: Secondary | ICD-10-CM | POA: Insufficient documentation

## 2020-05-23 DIAGNOSIS — I10 Essential (primary) hypertension: Secondary | ICD-10-CM | POA: Insufficient documentation

## 2020-05-23 DIAGNOSIS — I4892 Unspecified atrial flutter: Secondary | ICD-10-CM

## 2020-05-23 DIAGNOSIS — Z87891 Personal history of nicotine dependence: Secondary | ICD-10-CM | POA: Diagnosis not present

## 2020-05-23 DIAGNOSIS — Z95818 Presence of other cardiac implants and grafts: Secondary | ICD-10-CM | POA: Insufficient documentation

## 2020-05-23 DIAGNOSIS — D6869 Other thrombophilia: Secondary | ICD-10-CM | POA: Diagnosis not present

## 2020-05-23 DIAGNOSIS — Z7901 Long term (current) use of anticoagulants: Secondary | ICD-10-CM | POA: Diagnosis not present

## 2020-05-23 DIAGNOSIS — Z79899 Other long term (current) drug therapy: Secondary | ICD-10-CM | POA: Insufficient documentation

## 2020-05-23 DIAGNOSIS — Z8673 Personal history of transient ischemic attack (TIA), and cerebral infarction without residual deficits: Secondary | ICD-10-CM | POA: Diagnosis not present

## 2020-05-23 DIAGNOSIS — Z888 Allergy status to other drugs, medicaments and biological substances status: Secondary | ICD-10-CM | POA: Insufficient documentation

## 2020-05-23 LAB — CBC
HCT: 50.1 % (ref 39.0–52.0)
Hemoglobin: 16.6 g/dL (ref 13.0–17.0)
MCH: 30.3 pg (ref 26.0–34.0)
MCHC: 33.1 g/dL (ref 30.0–36.0)
MCV: 91.6 fL (ref 80.0–100.0)
Platelets: 206 10*3/uL (ref 150–400)
RBC: 5.47 MIL/uL (ref 4.22–5.81)
RDW: 12.6 % (ref 11.5–15.5)
WBC: 6 10*3/uL (ref 4.0–10.5)
nRBC: 0 % (ref 0.0–0.2)

## 2020-05-23 LAB — BASIC METABOLIC PANEL
Anion gap: 5 (ref 5–15)
BUN: 14 mg/dL (ref 8–23)
CO2: 28 mmol/L (ref 22–32)
Calcium: 9.2 mg/dL (ref 8.9–10.3)
Chloride: 106 mmol/L (ref 98–111)
Creatinine, Ser: 0.96 mg/dL (ref 0.61–1.24)
GFR, Estimated: >60 mL/min (ref >60)
Glucose, Bld: 91 mg/dL (ref 70–99)
Potassium: 4.1 mmol/L (ref 3.5–5.1)
Sodium: 139 mmol/L (ref 135–145)

## 2020-06-04 ENCOUNTER — Ambulatory Visit (INDEPENDENT_AMBULATORY_CARE_PROVIDER_SITE_OTHER): Payer: Medicare PPO

## 2020-06-04 DIAGNOSIS — I63411 Cerebral infarction due to embolism of right middle cerebral artery: Secondary | ICD-10-CM | POA: Diagnosis not present

## 2020-06-04 LAB — CUP PACEART REMOTE DEVICE CHECK
Date Time Interrogation Session: 20220423230523
Implantable Pulse Generator Implant Date: 20200821

## 2020-06-21 NOTE — Progress Notes (Signed)
Carelink Summary Report / Loop Recorder 

## 2020-07-05 ENCOUNTER — Ambulatory Visit (INDEPENDENT_AMBULATORY_CARE_PROVIDER_SITE_OTHER): Payer: Medicare PPO

## 2020-07-05 DIAGNOSIS — I63411 Cerebral infarction due to embolism of right middle cerebral artery: Secondary | ICD-10-CM | POA: Diagnosis not present

## 2020-07-05 LAB — CUP PACEART REMOTE DEVICE CHECK
Date Time Interrogation Session: 20220524230417
Implantable Pulse Generator Implant Date: 20200821

## 2020-07-30 NOTE — Progress Notes (Signed)
Carelink Summary Report / Loop Recorder 

## 2020-08-04 LAB — CUP PACEART REMOTE DEVICE CHECK
Date Time Interrogation Session: 20220624230503
Implantable Pulse Generator Implant Date: 20200821

## 2020-08-06 ENCOUNTER — Ambulatory Visit (INDEPENDENT_AMBULATORY_CARE_PROVIDER_SITE_OTHER): Payer: Medicare PPO

## 2020-08-06 DIAGNOSIS — I63411 Cerebral infarction due to embolism of right middle cerebral artery: Secondary | ICD-10-CM | POA: Diagnosis not present

## 2020-08-13 ENCOUNTER — Other Ambulatory Visit (HOSPITAL_COMMUNITY): Payer: Self-pay | Admitting: Physician Assistant

## 2020-08-23 NOTE — Progress Notes (Signed)
Carelink Summary Report / Loop Recorder 

## 2020-09-06 ENCOUNTER — Ambulatory Visit (INDEPENDENT_AMBULATORY_CARE_PROVIDER_SITE_OTHER): Payer: Medicare PPO

## 2020-09-06 DIAGNOSIS — I63411 Cerebral infarction due to embolism of right middle cerebral artery: Secondary | ICD-10-CM | POA: Diagnosis not present

## 2020-09-07 LAB — CUP PACEART REMOTE DEVICE CHECK
Date Time Interrogation Session: 20220725230249
Implantable Pulse Generator Implant Date: 20200821

## 2020-10-03 NOTE — Progress Notes (Signed)
Carelink Summary Report / Loop Recorder 

## 2020-10-08 ENCOUNTER — Ambulatory Visit (INDEPENDENT_AMBULATORY_CARE_PROVIDER_SITE_OTHER): Payer: Medicare PPO

## 2020-10-08 DIAGNOSIS — I63411 Cerebral infarction due to embolism of right middle cerebral artery: Secondary | ICD-10-CM | POA: Diagnosis not present

## 2020-10-08 LAB — CUP PACEART REMOTE DEVICE CHECK
Date Time Interrogation Session: 20220825230535
Implantable Pulse Generator Implant Date: 20200821

## 2020-10-19 NOTE — Progress Notes (Signed)
Carelink Summary Report / Loop Recorder 

## 2020-11-07 LAB — CUP PACEART REMOTE DEVICE CHECK
Date Time Interrogation Session: 20220925230632
Implantable Pulse Generator Implant Date: 20200821

## 2020-11-08 ENCOUNTER — Ambulatory Visit (INDEPENDENT_AMBULATORY_CARE_PROVIDER_SITE_OTHER): Payer: Medicare PPO

## 2020-11-08 DIAGNOSIS — I63411 Cerebral infarction due to embolism of right middle cerebral artery: Secondary | ICD-10-CM

## 2020-11-15 NOTE — Progress Notes (Signed)
Carelink Summary Report / Loop Recorder 

## 2020-11-22 ENCOUNTER — Other Ambulatory Visit: Payer: Self-pay

## 2020-11-22 ENCOUNTER — Ambulatory Visit (HOSPITAL_COMMUNITY)
Admission: RE | Admit: 2020-11-22 | Discharge: 2020-11-22 | Disposition: A | Discharge status: Home / Self Care | Payer: Medicare PPO | Source: Ambulatory Visit | Attending: Physician Assistant | Admitting: Physician Assistant

## 2020-11-22 ENCOUNTER — Encounter (HOSPITAL_COMMUNITY): Payer: Self-pay | Admitting: Physician Assistant

## 2020-11-22 VITALS — BP 126/84 | HR 79 | Ht 70.0 in | Wt 186.6 lb

## 2020-11-22 DIAGNOSIS — I10 Essential (primary) hypertension: Secondary | ICD-10-CM | POA: Diagnosis not present

## 2020-11-22 DIAGNOSIS — Z87891 Personal history of nicotine dependence: Secondary | ICD-10-CM | POA: Insufficient documentation

## 2020-11-22 DIAGNOSIS — Z7901 Long term (current) use of anticoagulants: Secondary | ICD-10-CM | POA: Diagnosis not present

## 2020-11-22 DIAGNOSIS — I4892 Unspecified atrial flutter: Secondary | ICD-10-CM | POA: Insufficient documentation

## 2020-11-22 DIAGNOSIS — D6869 Other thrombophilia: Secondary | ICD-10-CM | POA: Diagnosis not present

## 2020-11-22 DIAGNOSIS — Z79899 Other long term (current) drug therapy: Secondary | ICD-10-CM | POA: Insufficient documentation

## 2020-11-22 NOTE — Progress Notes (Signed)
Primary Care Physician: Reynold Bowen, MD Primary Electrophysiologist: Dr Curt Bears Referring Physician: Dr Hali Marry is a 70 y.o. male with a history of HTN, bladder cancer, prior cryptogenic stroke with ILR implant 09/2018, and atrial flutter who presents for follow up in the [facility]. The patient was initially diagnosed with atrial flutter 04/01/20 on his ILR. Patient has a CHADS2VASC score of 4. He denies any awareness of the arrhythmia. He denies significant snoring or alcohol use.   On follow up today, patient reports that he has done very well since his last visit. He has been more active recently. ILR shows 0% afib burden. He denies any bleeding issues on anticoagulation.   Today, he denies symptoms of palpitations, chest pain, shortness of breath, orthopnea, PND, lower extremity edema, dizziness, presyncope, syncope, snoring, daytime somnolence, bleeding, or neurologic sequela. The patient is tolerating medications without difficulties and is otherwise without complaint today.    Atrial Fibrillation Risk Factors:  he does not have symptoms or diagnosis of sleep apnea. he does not have a history of rheumatic fever. he does not have a history of alcohol use. The patient does not have a history of early familial atrial fibrillation or other arrhythmias.  he has a BMI of Body mass index is 26.77 kg/m.Marland Kitchen Filed Weights   11/22/20 1027  Weight: 84.6 kg     Family History  Problem Relation Age of Onset   Blindness Mother    Cataracts Mother    Diabetes Mother    Glaucoma Mother    Macular degeneration Mother    Cataracts Father    Diabetes Father    Diabetes Sister    Diabetes Brother    Blindness Maternal Uncle    Macular degeneration Maternal Uncle    Macular degeneration Paternal Aunt    Amblyopia Neg Hx    Retinal detachment Neg Hx    Strabismus Neg Hx    Retinitis pigmentosa Neg Hx      Atrial Fibrillation  Management history:  Previous antiarrhythmic drugs: none Previous cardioversions: none Previous ablations: none CHADS2VASC score: 4 Anticoagulation history: Eliquis   Past Medical History:  Diagnosis Date   Arthritis    Bladder cancer ([place])    CVA (cerebral vascular accident) ([place]) 09/2018   History of kidney stones 2012   passed / has renal cyst to evaluated at later time   Hypertension    Wears glasses    Past Surgical History:  Procedure Laterality Date   BUBBLE STUDY  10/01/2018   Procedure: BUBBLE STUDY;  Surgeon: Fay Records, MD;  Location: [place];  Service: Cardiovascular;;   CYSTOSCOPY N/A 11/05/2016   Procedure: Consuela Mimes;  Surgeon: Ceasar Mons, MD;  [facility];  Service: Urology;  Laterality: N/A;   CYSTOSCOPY WITH BIOPSY Bilateral 10/24/2016   Procedure: CYSTOSCOPY WITH BLADDER BIOPSY/ BILATERAL RETROGRADE [id number];  Surgeon: Ceasar Mons, MD;  Location: [facility];  Service: Urology;  Laterality: Bilateral;   CYSTOSCOPY WITH BIOPSY N/A 05/12/2018   Procedure: CYSTOSCOPY WITH BLADDER BIOPSY/ BILATERAL RETROGRADE PYELOGRAM BIOPSY;  Surgeon: Ceasar Mons, MD;  Location: [facility];  Service: Urology;  Laterality: N/A;   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 left 03-25-2006;  right 07-07-2006   and Gastroc Slide   fattytumorremoved from back      [place] N/A 10/01/2018   Procedure: [place];  Surgeon: Constance Haw, MD;  Location:  [place] INVASIVE CV LAB;  Service: Cardiovascular;  Laterality: N/A;   TEE WITHOUT CARDIOVERSION N/A 10/01/2018   Procedure: TRANSESOPHAGEAL ECHOCARDIOGRAM (TEE);  Surgeon: Fay Records, MD;  Location: [place];  Service: Cardiovascular;  Laterality: N/A;   TRANSURETHRAL RESECTION OF BLADDER TUMOR N/A 11/05/2016   Procedure: TRANSURETHRAL RESECTION OF BLADDER TUMOR (TURBT);  Surgeon: Ceasar Mons, MD;  Location: [facility];  Service: Urology;  Laterality: N/A;    Current Outpatient Medications  Medication Sig Dispense Refill   acetaminophen (TYLENOL) 500 MG tablet Take 500 mg by mouth every 6 (six) hours as needed for moderate pain.     diphenhydrAMINE (BENADRYL) 25 mg capsule Take 25 mg by mouth every 6 (six) hours as needed for allergies.     DULoxetine (CYMBALTA) 30 MG capsule Take 30 mg by mouth daily.     ELIQUIS 5 MG TABS tablet Take 1 tablet by mouth twice daily 60 tablet 6   fluticasone (FLONASE) 50 MCG/ACT nasal spray Place 1 spray into both nostrils as needed for allergies or rhinitis.     lisinopril (PRINIVIL,ZESTRIL) 40 MG tablet Take 40 mg by mouth every evening.      Melatonin 5 MG TABS Take 5 mg by mouth at bedtime.     methocarbamol (ROBAXIN) 500 MG tablet      montelukast (SINGULAIR) 10 MG tablet Take 10 mg by mouth daily as needed (allergies).   6   REPATHA SURECLICK 001 MG/ML SOAJ      sildenafil (VIAGRA) 100 MG tablet Take 100 mg by mouth daily as needed for erectile dysfunction.     tamsulosin (FLOMAX) 0.4 MG CAPS capsule Take 1 capsule (0.4 mg total) by mouth daily. 30 capsule 11   No current facility-administered medications for this encounter.    Allergies  Allergen Reactions   Statins Hives and Rash    whelps,liver enzymes elevated   Iohexol Other (See Comments)    Pt had sneezing after his CT with IV contrast, lasting only 10- 15 minutes, no intervention was needed, per radiologist    Social History   Socioeconomic History   Marital status: Legally Separated    Spouse name: Not on file   Number of children: Not on file   Years of education: Not on file   Highest education level: Not on file  Occupational History   Occupation: retired  Tobacco Use   Smoking status: Former    Years: 28.00    Types: Cigarettes    Quit date: 10/22/1999    Years since quitting: 21.1   Smokeless tobacco: Never   Tobacco  comments:    Former smoker 11/22/2020  Vaping Use   Vaping Use: Never used  Substance and Sexual Activity   Alcohol use: Yes    Alcohol/week: 7.0 standard drinks    Types: 7 Cans of beer per week    Comment: 2-3 beers in a sitting   Drug use: No   Sexual activity: Not on file  Other Topics Concern   Not on file  Social History Narrative   [name] a 70 year old retired legally separated male He lives at home and has the assistance of his son and Margreta Journey    Social Determinants of Health   Financial Resource Strain: Not on file  Food Insecurity: Not on file  Transportation Needs: Not on file  Physical Activity: Not on file  Stress: Not on file  Social Connections: Not on file  Intimate Partner Violence:  Not on file     ROS- All systems are reviewed and negative except as per the HPI above.  Physical Exam: Vitals:   11/22/20 1027  BP: 126/84  Pulse: 79  Weight: 84.6 kg  Height: 5\' 10"  (1.778 m)    GEN- The patient is a well appearing male, alert and oriented x 3 today.   HEENT-head normocephalic, atraumatic, sclera clear, conjunctiva pink, hearing intact, trachea midline. Lungs- Clear to ausculation bilaterally, normal work of breathing Heart- Regular rate and rhythm, no murmurs, rubs or gallops  GI- soft, NT, ND, + BS Extremities- no clubbing, cyanosis, or edema MS- no significant deformity or atrophy Skin- no rash or lesion Psych- euthymic mood, full affect Neuro- strength and sensation are intact   Wt Readings from Last 3 Encounters:  11/22/20 84.6 kg  05/23/20 82.3 kg  04/06/20 83.6 kg    EKG today demonstrates  SR Vent. rate 79 BPM PR interval 158 ms QRS duration 84 ms QT/QTcB 350/401 ms  Echo 09/29/18 demonstrated  1. The left ventricle has normal systolic function with an ejection  fraction of 60-65%. The cavity size was normal. Left ventricular diastolic parameters were normal.   2. The right ventricle has normal systolic function.  The cavity was  normal. There is no increase in right ventricular wall thickness.   3. Mild thickening of the mitral valve leaflet.   4. The aortic valve is tricuspid. Mild thickening of the aortic valve.  Sclerosis without any evidence of stenosis of the aortic valve.   5. The aorta is normal unless otherwise noted.   Epic records are reviewed at length today  CHA2DS2-VASc Score = 4  The patient's score is based upon: CHF History: 0 HTN History: 1 Diabetes History: 0 Stroke History: 2 Vascular Disease History: 0 Age Score: 1 Gender Score: 0      ASSESSMENT AND PLAN: 1. Atrial flutter The patient's CHA2DS2-VASc score is 4, indicating a 4.8% annual risk of stroke.   ILR shows 0% afib burden. Continue Eliquis 5 mg BID  2. Secondary Hypercoagulable State (ICD10:  D68.69) The patient is at significant risk for stroke/thromboembolism based upon his CHA2DS2-VASc Score of 4.  Continue Apixaban (Eliquis).   3. HTN Stable, no changes today.   Follow up in the AF clinic in 6 months.    [facility] [address] [place], [place] [address] [phone] 11/22/2020 10:57 AM

## 2020-12-06 LAB — CUP PACEART REMOTE DEVICE CHECK
Date Time Interrogation Session: 20221026230735
Implantable Pulse Generator Implant Date: 20200821

## 2020-12-10 ENCOUNTER — Ambulatory Visit (INDEPENDENT_AMBULATORY_CARE_PROVIDER_SITE_OTHER): Payer: Medicare PPO

## 2020-12-10 DIAGNOSIS — I63411 Cerebral infarction due to embolism of right middle cerebral artery: Secondary | ICD-10-CM | POA: Diagnosis not present

## 2020-12-18 NOTE — Progress Notes (Signed)
Carelink Summary Report / Loop Recorder 

## 2021-01-07 LAB — CUP PACEART REMOTE DEVICE CHECK
Date Time Interrogation Session: 20221126230546
Implantable Pulse Generator Implant Date: 20200821

## 2021-01-10 ENCOUNTER — Ambulatory Visit (INDEPENDENT_AMBULATORY_CARE_PROVIDER_SITE_OTHER): Payer: Medicare Other

## 2021-01-10 DIAGNOSIS — I63411 Cerebral infarction due to embolism of right middle cerebral artery: Secondary | ICD-10-CM | POA: Diagnosis not present

## 2021-01-21 NOTE — Progress Notes (Signed)
Carelink Summary Report / Loop Recorder 

## 2021-02-01 ENCOUNTER — Encounter (HOSPITAL_COMMUNITY): Payer: Medicare PPO

## 2021-02-01 ENCOUNTER — Ambulatory Visit: Payer: Medicare Other

## 2021-02-06 ENCOUNTER — Other Ambulatory Visit: Payer: Self-pay

## 2021-02-06 DIAGNOSIS — I6529 Occlusion and stenosis of unspecified carotid artery: Secondary | ICD-10-CM

## 2021-02-10 LAB — CUP PACEART REMOTE DEVICE CHECK
Date Time Interrogation Session: 20221231230241
Implantable Pulse Generator Implant Date: 20200821

## 2021-02-12 ENCOUNTER — Ambulatory Visit (INDEPENDENT_AMBULATORY_CARE_PROVIDER_SITE_OTHER): Payer: Medicare Other

## 2021-02-12 DIAGNOSIS — I63411 Cerebral infarction due to embolism of right middle cerebral artery: Secondary | ICD-10-CM | POA: Diagnosis not present

## 2021-02-14 IMAGING — CT CT ANGIOGRAPHY HEAD
2 of 3 series · 8 of 14 positions shown · IV contrast (OMNIPAQUE 350)
Comparison: Head CT 09/28/2018

CLINICAL DATA: Stroke, follow-up. Additional history: Confusion for
the last 5 days, forgetfulness, difficulty using phone, son concern
for visual changes.

EXAM:
CT ANGIOGRAPHY HEAD AND NECK
TECHNIQUE: Multidetector CT imaging of the head and neck was performed using
the standard protocol during bolus administration of intravenous
contrast. Multiplanar CT image reconstructions and MIPs were
obtained to evaluate the vascular anatomy. Carotid stenosis
measurements (when applicable) are obtained utilizing NASCET
criteria, using the distal internal carotid diameter as the
denominator.
CONTRAST:  100mL OMNIPAQUE IOHEXOL 350 MG/ML SOLN

[Series 5: cta head neck · axial · 0.43mm/px · z∈[-174,-64]mm · 2 of 167 slices shown]
[im 56/167  soft-tissue]
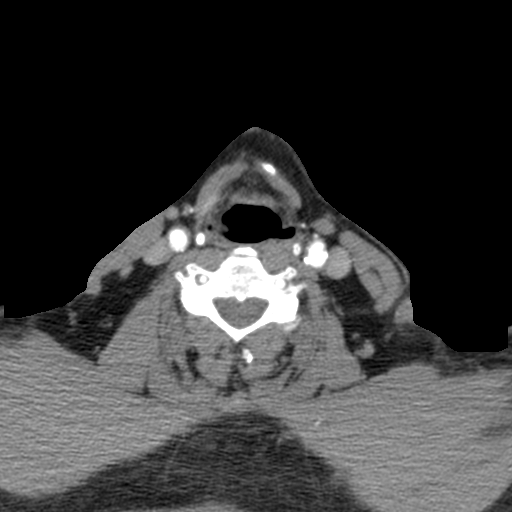
[im 111/167  soft-tissue]
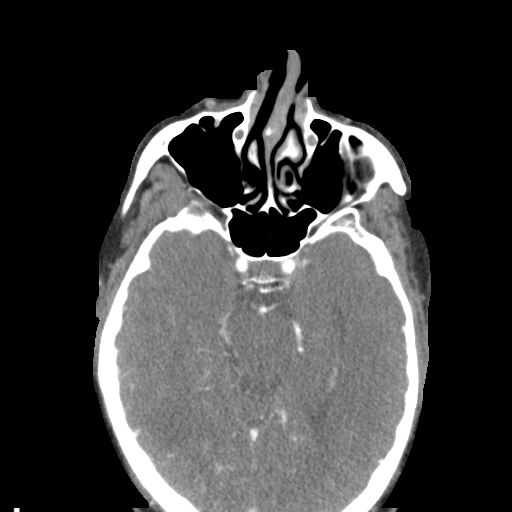

[Series 7: ax thin · axial · 0.39mm/px · z∈[-236,-1]mm · 6 of 331 slices shown]
[im 48/331  soft-tissue]
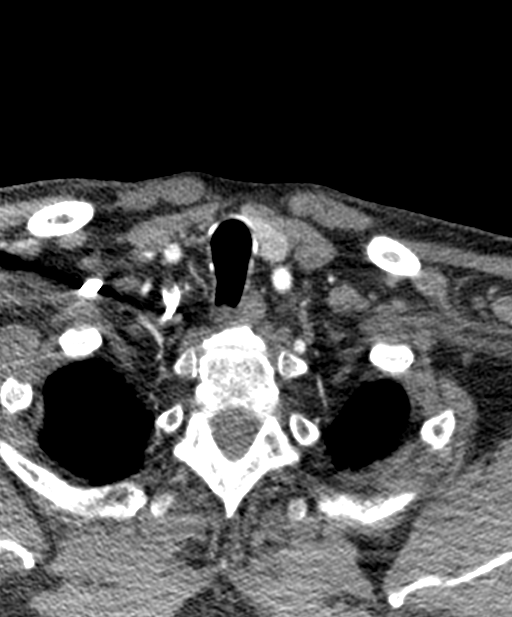
[im 95/331  bone]
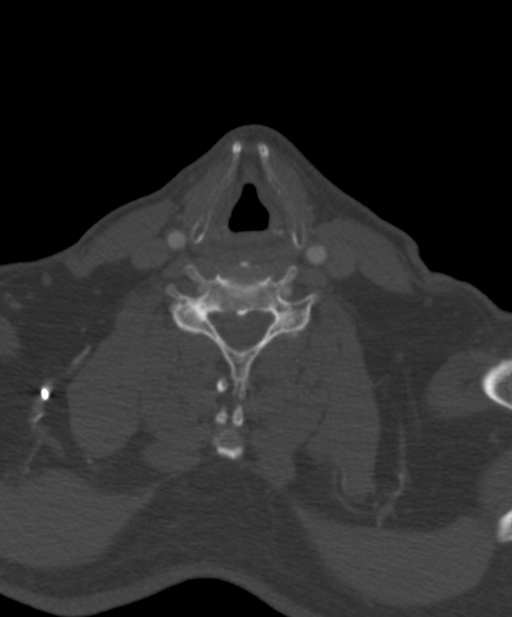
[im 142/331  soft-tissue]
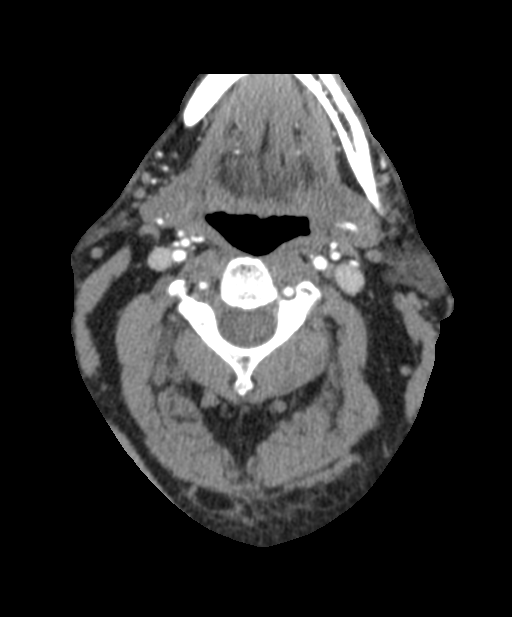
[im 189/331  bone]
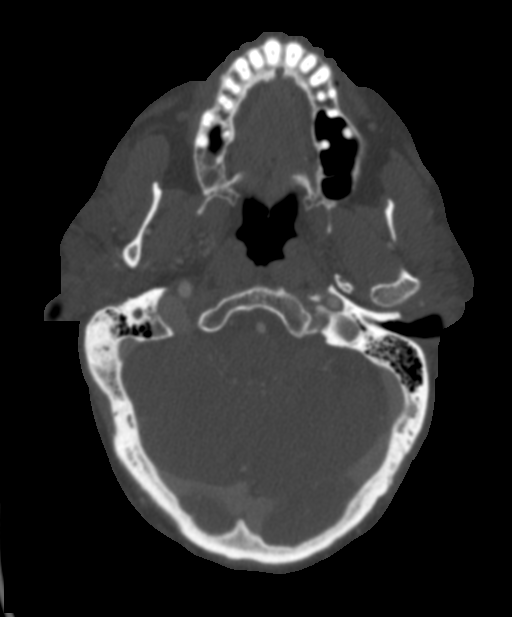
[im 236/331  soft-tissue]
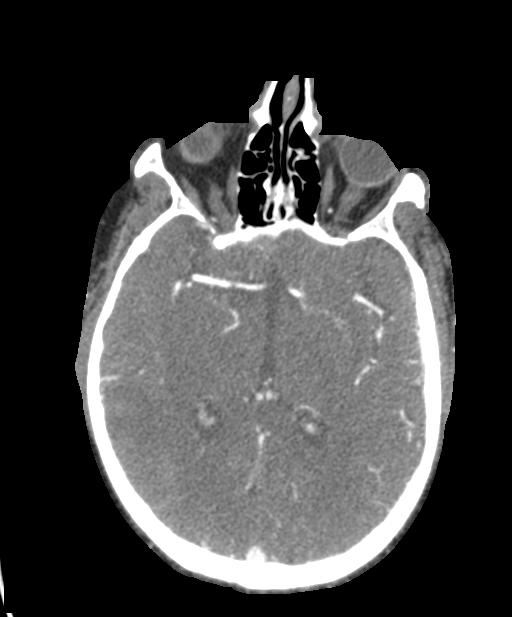
[im 283/331  bone]
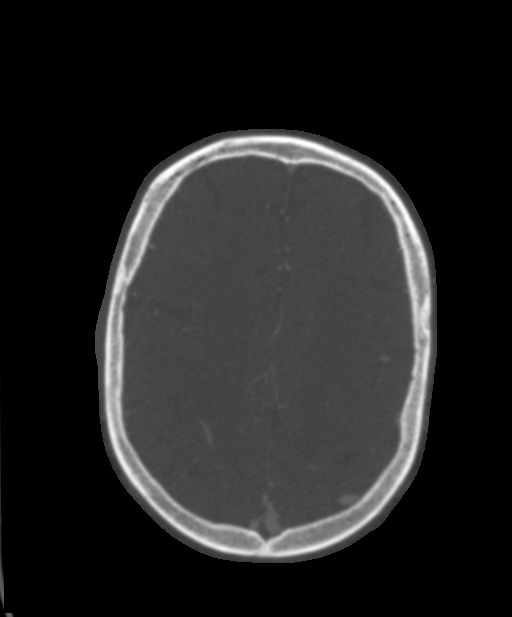

[8 of 14 positions shown; findings below may reference images not displayed]

FINDINGS: CTA NECK FINDINGS

Aortic arch: Standard branching. Atherosclerotic calcification
within the imaged aortic arch and major branch vessels. No
high-grade narrowing of the major branch vessel origins.

Right carotid system: Atherosclerotic plaque at the right carotid
bifurcation with less than 50% stenosis of the proximal right
cervical internal carotid artery.

Left carotid system: Atherosclerotic plaque at the left carotid
bifurcation without significant stenosis (50% or greater). Calcified
plaque also present within the distal cervical left ICA without
significant stenosis (50% or greater).

Vertebral arteries: Scattered atherosclerotic plaque within the
bilateral cervical vertebral arteries without significant stenosis
(50% or greater).

Skeleton: Cervical spondylosis without high-grade bony spinal canal
stenosis. No suspicious lytic or blastic osseous lesions.

Other neck: No soft tissue neck mass or pathologically enlarged
cervical chain lymph nodes.

Upper chest: No confluent consolidation within the imaged lung
apices.

Review of the MIP images confirms the above findings

CTA HEAD FINDINGS

Anterior circulation: Calcified and noncalcified plaque within the
bilateral carotid siphons with no more than mild luminal narrowing.
The right middle and anterior cerebral arteries are patent without
significant proximal stenosis. The left middle and anterior cerebral
arteries are patent without significant proximal stenosis. No
anterior circulation aneurysm is identified.

Posterior circulation: Scattered atherosclerotic plaque within the
intracranial vertebrobasilar system. Streak artifact from dental
restoration limits evaluation of the non dominant right vertebral
artery at the level of the skull base, and stenosis at the level of
the foramen magnum is difficult to exclude. Mild/moderate narrowing
of the distal right vertebral artery just proximal to the
vertebrobasilar junction. The left vertebral artery is patent
without significant stenosis. Atherosclerotic irregularity of the
basilar artery with mild luminal narrowing of the proximal basilar
artery. The bilateral posterior cerebral arteries are patent without
significant proximal stenosis. Posterior communicating arteries are
present bilaterally. No posterior circulation aneurysm is
identified.

Venous sinuses: Within the limitations of contrast timing, no
evidence of thrombosis.

Anatomic variants: None of significance.

Other: Right maxillary sinus mucous retention cyst.

Review of the MIP images confirms the above findings
IMPRESSION: CTA HEAD:

Streak artifact from dental restoration limits evaluation of the
distal right vertebral artery at the level of the foramen magnum,
and a stenosis at this site cannot be excluded.

Intracranial atherosclerotic disease. Mild/moderate narrowing of the
non dominant distal right vertebral artery just proximal to the
vertebrobasilar junction. Additional sites of mild atherosclerotic
luminal narrowing within the basilar and bilateral intracranial
internal carotid arteries.

Parenchymal hypodensities within the right cerebral cortex
suspicious for multifocal subacute infarcts were better appreciated
on prior head CT 09/28/2018. Consider brain MRI for further
evaluation.

No intracranial aneurysm identified.

CTA NECK:

The common carotid, cervical internal carotid and cervical vertebral
arteries are patent within the neck. Atherosclerotic disease results
in less than 50% narrowing of the proximal cervical right ICA.

## 2021-02-19 ENCOUNTER — Ambulatory Visit (INDEPENDENT_AMBULATORY_CARE_PROVIDER_SITE_OTHER): Payer: Medicare Other | Admitting: Physician Assistant

## 2021-02-19 ENCOUNTER — Ambulatory Visit (HOSPITAL_COMMUNITY)
Admission: RE | Admit: 2021-02-19 | Discharge: 2021-02-19 | Disposition: A | Discharge status: Home / Self Care | Payer: Medicare Other | Source: Ambulatory Visit | Attending: Vascular Surgery | Admitting: Vascular Surgery

## 2021-02-19 ENCOUNTER — Other Ambulatory Visit: Payer: Self-pay

## 2021-02-19 VITALS — BP 110/64 | HR 67 | Temp 98.2°F | Resp 18 | Ht 71.0 in | Wt 189.8 lb

## 2021-02-19 DIAGNOSIS — I6529 Occlusion and stenosis of unspecified carotid artery: Secondary | ICD-10-CM

## 2021-02-19 NOTE — Progress Notes (Signed)
Office Note     CC:  follow up Requesting Provider:  Reynold Bowen, MD  HPI: Shaun Ball is a 71 y.o. ([birth year]) male who presents to go over vascular studies related to carotid artery stenosis.  Since last office visit 1 year ago he denies any diagnosis of CVA or TIA.  He also denies any strokelike symptoms currently including slurring speech, changes in vision, or one-sided weakness.  He also denies any drop attacks or arm claudication.  He is using injectable Repatha for hyperlipidemia.  He is on Eliquis for atrial flutter.  He denies tobacco use.   Past Medical History:  Diagnosis Date   Arthritis    Bladder cancer ([place])    CVA (cerebral vascular accident) ([place]) 09/2018   History of kidney stones 2012   passed / has renal cyst to evaluated at later time   Hypertension    Wears glasses     Past Surgical History:  Procedure Laterality Date   BUBBLE STUDY  10/01/2018   Procedure: BUBBLE STUDY;  Surgeon: Fay Records, MD;  Location: [facility];  Service: Cardiovascular;;   CYSTOSCOPY N/A 11/05/2016   Procedure: Consuela Mimes;  Surgeon: Ceasar Mons, MD;  Location: [facility];  Service: Urology;  Laterality: N/A;   CYSTOSCOPY WITH BIOPSY Bilateral 10/24/2016   Procedure: CYSTOSCOPY WITH BLADDER BIOPSY/ BILATERAL RETROGRADE [id number];  Surgeon: Ceasar Mons, MD;  Location: [facility];  Service: Urology;  Laterality: Bilateral;   CYSTOSCOPY WITH BIOPSY N/A 05/12/2018   Procedure: CYSTOSCOPY WITH BLADDER BIOPSY/ BILATERAL RETROGRADE PYELOGRAM BIOPSY;  Surgeon: Ceasar Mons, MD;  Location: [facility];  Service: Urology;  Laterality: N/A;   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 left 03-25-2006;  right 07-07-2006   and Gastroc Slide   fattytumorremoved from back      [place] N/A 10/01/2018   Procedure: [place];  Surgeon: Constance Haw, MD;  Location: [place] [facility];  Service: Cardiovascular;  Laterality: N/A;   TEE WITHOUT CARDIOVERSION N/A 10/01/2018   Procedure: TRANSESOPHAGEAL ECHOCARDIOGRAM (TEE);  Surgeon: Fay Records, MD;  Location: [place] [facility];  Service: Cardiovascular;  Laterality: N/A;   TRANSURETHRAL RESECTION OF BLADDER TUMOR N/A 11/05/2016   Procedure: TRANSURETHRAL RESECTION OF BLADDER TUMOR (TURBT);  Surgeon: Ceasar Mons, MD;  Location: [facility];  Service: Urology;  Laterality: N/A;    Social History   Socioeconomic History   Marital status: Legally Separated    Spouse name: Not on file   Number of children: Not on file   Years of education: Not on file   Highest education level: Not on file  Occupational History   Occupation: retired  Tobacco Use   Smoking status: Former    Years: 28.00    Types: Cigarettes    Quit date: 10/22/1999    Years since quitting: 21.3   Smokeless tobacco: Never   Tobacco comments:    Former smoker 11/22/2020  Vaping Use   Vaping Use: Never used  Substance and Sexual Activity   Alcohol use: Yes    Alcohol/week: 7.0 standard drinks    Types: 7 Cans of beer per week    Comment: 2-3 beers in a sitting   Drug use: No   Sexual activity: Not on file  Other Topics Concern   Not on file  Social History Narrative   Mr Shaun Ball a 71 year old retired legally separated male He lives at home  and has the assistance of his son and [place]    Social Determinants of Health   Financial Resource Strain: Not on file  Food Insecurity: Not on file  Transportation Needs: Not on file  Physical Activity: Not on file  Stress: Not on file  Social Connections: Not on file  Intimate Partner Violence: Not on file    Family History  Problem Relation Age of Onset   Blindness Mother    Cataracts Mother    Diabetes Mother    Glaucoma Mother    Macular degeneration Mother    Cataracts Father    Diabetes Father    Diabetes Sister    Diabetes Brother     Blindness Maternal Uncle    Macular degeneration Maternal Uncle    Macular degeneration Paternal Aunt    Amblyopia Neg Hx    Retinal detachment Neg Hx    Strabismus Neg Hx    Retinitis pigmentosa Neg Hx     Current Outpatient Medications  Medication Sig Dispense Refill   acetaminophen (TYLENOL) 500 MG tablet Take 500 mg by mouth every 6 (six) hours as needed for moderate pain.     diphenhydrAMINE (BENADRYL) 25 mg capsule Take 25 mg by mouth every 6 (six) hours as needed for allergies.     DULoxetine (CYMBALTA) 30 MG capsule Take 30 mg by mouth daily.     ELIQUIS 5 MG TABS tablet Take 1 tablet by mouth twice daily 60 tablet 6   fluticasone (FLONASE) 50 MCG/ACT nasal spray Place 1 spray into both nostrils as needed for allergies or rhinitis.     lisinopril (PRINIVIL,ZESTRIL) 40 MG tablet Take 40 mg by mouth every evening.      Melatonin 5 MG TABS Take 5 mg by mouth at bedtime.     methocarbamol (ROBAXIN) 500 MG tablet      montelukast (SINGULAIR) 10 MG tablet Take 10 mg by mouth daily as needed (allergies).   6   REPATHA SURECLICK 846 MG/ML SOAJ      sildenafil (VIAGRA) 100 MG tablet Take 100 mg by mouth daily as needed for erectile dysfunction.     tamsulosin (FLOMAX) 0.4 MG CAPS capsule Take 1 capsule (0.4 mg total) by mouth daily. 30 capsule 11   No current facility-administered medications for this visit.    Allergies  Allergen Reactions   Statins Hives and Rash    whelps,liver enzymes elevated   Iohexol Other (See Comments)    Pt had sneezing after his CT with IV contrast, lasting only 10- 15 minutes, no intervention was needed, per radiologist     REVIEW OF SYSTEMS:   [X]  denotes positive finding, [ ]  denotes negative finding Cardiac  Comments:  Chest pain or chest pressure:    Shortness of breath upon exertion:    Short of breath when lying flat:    Irregular heart rhythm:        Vascular    Pain in calf, thigh, or hip brought on by ambulation:    Pain in feet at  night that wakes you up from your sleep:     Blood clot in your veins:    Leg swelling:         Pulmonary    Oxygen at home:    Productive cough:     Wheezing:         Neurologic    Sudden weakness in arms or legs:     Sudden numbness in arms or legs:  Sudden onset of difficulty speaking or slurred speech:    Temporary loss of vision in one eye:     Problems with dizziness:         Gastrointestinal    Blood in stool:     Vomited blood:         Genitourinary    Burning when urinating:     Blood in urine:        Psychiatric    Major depression:         Hematologic    Bleeding problems:    Problems with blood clotting too easily:        Skin    Rashes or ulcers:        Constitutional    Fever or chills:      PHYSICAL EXAMINATION:  Vitals:   02/19/21 1444 02/19/21 1446  BP: (!) 148/78 110/64  Pulse: 67   Resp: 18   Temp: 98.2 F (36.8 C)   TempSrc: Temporal   SpO2: 98%   Weight: 189 lb 12.8 oz (86.1 kg)   Height: 5\' 11"  (1.803 m)     General:  WDWN in NAD; vital signs documented above Gait: Not observed HENT: WNL, normocephalic Pulmonary: normal non-labored breathing , without Rales, rhonchi,  wheezing Cardiac: regular HR Abdomen: soft, NT, no masses Skin: without rashes Vascular Exam/Pulses:  Right Left  Radial 2+ (normal) 2+ (normal)   Extremities: without ischemic changes, without Gangrene , without cellulitis; without open wounds;  Musculoskeletal: no muscle wasting or atrophy  Neurologic: A&O X 3; CN grossly intact Psychiatric:  The pt has Normal affect.   Non-Invasive Vascular Imaging:   R ICA 40-59% stenosis L ICA 1-39% stenosis R subclavian stenotic    ASSESSMENT/PLAN:: 71 y.o. 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

## 2021-02-21 NOTE — Progress Notes (Signed)
Carelink Summary Report / Loop Recorder 

## 2021-03-14 ENCOUNTER — Ambulatory Visit (INDEPENDENT_AMBULATORY_CARE_PROVIDER_SITE_OTHER): Payer: Medicare Other

## 2021-03-14 DIAGNOSIS — I63411 Cerebral infarction due to embolism of right middle cerebral artery: Secondary | ICD-10-CM

## 2021-03-14 LAB — CUP PACEART REMOTE DEVICE CHECK
Date Time Interrogation Session: 20230201230717
Implantable Pulse Generator Implant Date: 20200821

## 2021-03-20 NOTE — Progress Notes (Signed)
Carelink Summary Report / Loop Recorder 

## 2021-03-30 ENCOUNTER — Other Ambulatory Visit (HOSPITAL_COMMUNITY): Payer: Self-pay | Admitting: Physician Assistant

## 2021-03-30 ENCOUNTER — Telehealth: Payer: Self-pay | Admitting: Physician Assistant

## 2021-03-30 NOTE — Telephone Encounter (Signed)
° °  The patient called the answering service after-hours today. Remotely followed by [facility] in 2020, now primarily follows with Afib clinic for which we cross cover on the weekends. Needs refill on Eliquis. Last OV 12/2020 with Adline Peals recommending to continue at present dosing. Will send in refill for 30 days and route to primary prescriber team for review of further refills. The patient verbalized understanding and gratitude.  Charlie Pitter, PA-C

## 2021-04-01 MED ORDER — APIXABAN 5 MG PO TABS: 5.0000 mg | ORAL_TABLET | Freq: Two times a day (BID) | ORAL | 6 refills | Status: DC

## 2021-04-01 NOTE — Telephone Encounter (Signed)
Refills sent

## 2021-04-01 NOTE — Addendum Note (Signed)
Addended by: Juluis Mire on: 04/01/2021 09:08 AM   Modules accepted: Orders

## 2021-04-15 ENCOUNTER — Ambulatory Visit (INDEPENDENT_AMBULATORY_CARE_PROVIDER_SITE_OTHER): Payer: Medicare Other

## 2021-04-15 DIAGNOSIS — I63411 Cerebral infarction due to embolism of right middle cerebral artery: Secondary | ICD-10-CM | POA: Diagnosis not present

## 2021-04-16 LAB — CUP PACEART REMOTE DEVICE CHECK
Date Time Interrogation Session: 20230306230904
Implantable Pulse Generator Implant Date: 20200821

## 2021-04-25 NOTE — Progress Notes (Signed)
Carelink Summary Report / Loop Recorder 

## 2021-05-16 ENCOUNTER — Ambulatory Visit (INDEPENDENT_AMBULATORY_CARE_PROVIDER_SITE_OTHER): Payer: Medicare Other

## 2021-05-16 DIAGNOSIS — I63411 Cerebral infarction due to embolism of right middle cerebral artery: Secondary | ICD-10-CM

## 2021-05-16 LAB — CUP PACEART REMOTE DEVICE CHECK
Date Time Interrogation Session: 20230405230603
Implantable Pulse Generator Implant Date: 20200821

## 2021-05-30 ENCOUNTER — Encounter (HOSPITAL_COMMUNITY): Payer: Self-pay | Admitting: Physician Assistant

## 2021-05-30 ENCOUNTER — Ambulatory Visit (HOSPITAL_COMMUNITY)
Admission: RE | Admit: 2021-05-30 | Discharge: 2021-05-30 | Disposition: A | Discharge status: Home / Self Care | Payer: Medicare Other | Source: Ambulatory Visit | Attending: Physician Assistant | Admitting: Physician Assistant

## 2021-05-30 VITALS — BP 118/88 | HR 59 | Ht 71.0 in | Wt 188.6 lb

## 2021-05-30 DIAGNOSIS — D6869 Other thrombophilia: Secondary | ICD-10-CM | POA: Insufficient documentation

## 2021-05-30 DIAGNOSIS — Z8673 Personal history of transient ischemic attack (TIA), and cerebral infarction without residual deficits: Secondary | ICD-10-CM | POA: Diagnosis not present

## 2021-05-30 DIAGNOSIS — I10 Essential (primary) hypertension: Secondary | ICD-10-CM | POA: Diagnosis present

## 2021-05-30 DIAGNOSIS — Z8551 Personal history of malignant neoplasm of bladder: Secondary | ICD-10-CM | POA: Diagnosis not present

## 2021-05-30 DIAGNOSIS — Z7901 Long term (current) use of anticoagulants: Secondary | ICD-10-CM | POA: Insufficient documentation

## 2021-05-30 DIAGNOSIS — Z95818 Presence of other cardiac implants and grafts: Secondary | ICD-10-CM | POA: Insufficient documentation

## 2021-05-30 DIAGNOSIS — I4892 Unspecified atrial flutter: Secondary | ICD-10-CM | POA: Diagnosis present

## 2021-05-30 LAB — BASIC METABOLIC PANEL
Anion gap: 7 (ref 5–15)
BUN: 17 mg/dL (ref 8–23)
CO2: 22 mmol/L (ref 22–32)
Calcium: 8.9 mg/dL (ref 8.9–10.3)
Chloride: 109 mmol/L (ref 98–111)
Creatinine, Ser: 0.94 mg/dL (ref 0.61–1.24)
GFR, Estimated: >60 mL/min (ref >60)
Glucose, Bld: 117 mg/dL — ABNORMAL HIGH (ref 70–99)
Potassium: 4.4 mmol/L (ref 3.5–5.1)
Sodium: 138 mmol/L (ref 135–145)

## 2021-05-30 LAB — CBC
HCT: 46.7 % (ref 39.0–52.0)
Hemoglobin: 15.3 g/dL (ref 13.0–17.0)
MCH: 30.6 pg (ref 26.0–34.0)
MCHC: 32.8 g/dL (ref 30.0–36.0)
MCV: 93.4 fL (ref 80.0–100.0)
Platelets: 178 10*3/uL (ref 150–400)
RBC: 5 MIL/uL (ref 4.22–5.81)
RDW: 12.8 % (ref 11.5–15.5)
WBC: 5.9 10*3/uL (ref 4.0–10.5)
nRBC: 0 % (ref 0.0–0.2)

## 2021-05-30 NOTE — Progress Notes (Signed)
? ? ?Primary Care Physician: Reynold Bowen, MD ?Primary Electrophysiologist: Dr Curt Bears ?Referring Physician: Dr Curt Bears ? ? ?Shaun Ball is a 71 y.o. male with a history of HTN, bladder cancer, prior cryptogenic stroke with ILR implant 09/2018, and atrial flutter who presents for follow up in the [facility]. The patient was initially diagnosed with atrial flutter 04/01/20 on his ILR. Patient has a CHADS2VASC score of 4. He denies any awareness of the arrhythmia. He denies significant snoring or alcohol use.  ? ?On follow up today, patient reports that he has done well since his last visit. ILR shows no interim episodes of afib. No significant bleeding issues on anticoagulation.  ? ?Today, he denies symptoms of palpitations, chest pain, shortness of breath, orthopnea, PND, lower extremity edema, dizziness, presyncope, syncope, snoring, daytime somnolence, bleeding, or neurologic sequela. The patient is tolerating medications without difficulties and is otherwise without complaint today.  ? ? ?Atrial Fibrillation Risk Factors: ? ?he does not have symptoms or diagnosis of sleep apnea. ?he does not have a history of rheumatic fever. ?he does not have a history of alcohol use. ?The patient does not have a history of early familial atrial fibrillation or other arrhythmias. ? ?he has a BMI of Body mass index is 26.3 kg/m?Marland KitchenMarland Kitchen ?Filed Weights  ? 05/30/21 1327  ?Weight: 85.5 kg  ? ? ? ?Family History  ?Problem Relation Age of Onset  ? Blindness Mother   ? Cataracts Mother   ? Diabetes Mother   ? Glaucoma Mother   ? Macular degeneration Mother   ? Cataracts Father   ? Diabetes Father   ? Diabetes Sister   ? Diabetes Brother   ? Blindness Maternal Uncle   ? Macular degeneration Maternal Uncle   ? Macular degeneration Paternal Aunt   ? Amblyopia Neg Hx   ? Retinal detachment Neg Hx   ? Strabismus Neg Hx   ? Retinitis pigmentosa Neg Hx   ? ? ? ?Atrial Fibrillation Management history: ? ?Previous  antiarrhythmic drugs: none ?Previous cardioversions: none ?Previous ablations: none ?CHADS2VASC score: 4 ?Anticoagulation history: Eliquis ? ? ?Past Medical History:  ?Diagnosis Date  ? Arthritis   ? Bladder cancer ([place])   ? CVA (cerebral vascular accident) ([place]) 09/2018  ? History of kidney stones 2012  ? passed / has renal cyst to evaluated at later time  ? Hypertension   ? Wears glasses   ? ?Past Surgical History:  ?Procedure Laterality Date  ? BUBBLE STUDY  10/01/2018  ? Procedure: BUBBLE STUDY;  Surgeon: Fay Records, MD;  Location: [facility];  Service: Cardiovascular;;  ? CYSTOSCOPY N/A 11/05/2016  ? Procedure: CYSTOSCOPY;  Surgeon: Ceasar Mons, MD;  Location: [facility];  Service: Urology;  Laterality: N/A;  ? CYSTOSCOPY WITH BIOPSY Bilateral 10/24/2016  ? Procedure: CYSTOSCOPY WITH BLADDER BIOPSY/ BILATERAL RETROGRADE WCHENIDP;  Surgeon: Ceasar Mons, MD;  Location: [facility];  Service: Urology;  Laterality: Bilateral;  ? CYSTOSCOPY WITH BIOPSY N/A 05/12/2018  ? Procedure: CYSTOSCOPY WITH BLADDER BIOPSY/ BILATERAL RETROGRADE PYELOGRAM BIOPSY;  Surgeon: Ceasar Mons, MD;  Location[facility];  Service: Urology;  Laterality: N/A;  ? 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 and Gastroc Slide  ? fattytumorremoved from back     ? LOOP RECORDER INSERTION N/A 10/01/2018  ? Procedure: LOOP RECORDER INSERTION;  Surgeon: Constance Haw, MD;  Location: [place] [facility];  Service:  Cardiovascular;  Laterality: N/A;  ? TEE WITHOUT CARDIOVERSION N/A 10/01/2018  ? Procedure: TRANSESOPHAGEAL ECHOCARDIOGRAM (TEE);  Surgeon: Fay Records, MD;  Location: [place];  Service: Cardiovascular;  Laterality: N/A;  ? TRANSURETHRAL RESECTION OF BLADDER TUMOR N/A 11/05/2016  ? Procedure: TRANSURETHRAL RESECTION OF BLADDER TUMOR (TURBT);  Surgeon: Ceasar Mons, MD;  Location: [facility];  Service: Urology;  Laterality: N/A;  ? ? ?Current Outpatient Medications  ?Medication Sig Dispense Refill  ? acetaminophen (TYLENOL) 500 MG tablet Take 500 mg by mouth every 6 (six) hours as needed for moderate pain.    ? apixaban (ELIQUIS) 5 MG TABS tablet Take 1 tablet (5 mg total) by mouth 2 (two) times daily. 60 tablet 6  ? diphenhydrAMINE (BENADRYL) 25 mg capsule Take 25 mg by mouth every 6 (six) hours as needed for allergies.    ? DULoxetine (CYMBALTA) 30 MG capsule Take 30 mg by mouth daily.    ? fluticasone (FLONASE) 50 MCG/ACT nasal spray Place 1 spray into both nostrils as needed for allergies or rhinitis.    ? lisinopril (PRINIVIL,ZESTRIL) 40 MG tablet Take 40 mg by mouth every evening.     ? Melatonin 5 MG TABS Take 5 mg by mouth at bedtime.    ? methocarbamol (ROBAXIN) 500 MG tablet     ? montelukast (SINGULAIR) 10 MG tablet Take 10 mg by mouth daily as needed (allergies).   6  ? REPATHA SURECLICK 161 MG/ML SOAJ     ? sildenafil (VIAGRA) 100 MG tablet Take 100 mg by mouth daily as needed for erectile dysfunction.    ? tamsulosin (FLOMAX) 0.4 MG CAPS capsule Take 1 capsule (0.4 mg total) by mouth daily. 30 capsule 11  ? ?No current facility-administered medications for this encounter.  ? ? ?Allergies  ?Allergen Reactions  ? Statins Hives and Rash  ?  whelps,liver enzymes elevated  ? Iohexol Other (See Comments)  ?  Pt had sneezing after his CT with IV contrast, lasting only 10- 15 minutes, no intervention was needed, per radiologist  ? ? ?Social History  ? ?Socioeconomic History  ? Marital status: Legally Separated  ?  Spouse name: Not on file  ? Number of children: Not on file  ? Years of education: Not on file  ? Highest education level: Not on file  ?Occupational History  ? Occupation: retired  ?Tobacco Use  ? Smoking status: Former  ?  Years: 28.00  ?  Types: Cigarettes  ?  Quit date: 10/22/1999  ?  Years since quitting: 21.6  ? Smokeless tobacco: Never  ? Tobacco comments:  ?   Former smoker 11/22/2020  ?Vaping Use  ? Vaping Use: Never used  ?Substance and Sexual Activity  ? Alcohol use: Yes  ?  Alcohol/week: 5.0 - 6.0 standard drinks  ?  Types: 5 - 6 Cans of beer per week  ?  Comment: 5-6 beers in a sitting 05/30/21  ? Drug use: No  ? Sexual activity: Not on file  ?Other Topics Concern  ? Not on file  ?Social History Narrative  ? [name] a 71 year old retired legally separated male He lives at home and has the assistance of his son and Margreta Journey   ? ?Social Determinants of Health  ? ?Financial Resource Strain: Not on file  ?Food Insecurity: Not on file  ?Transportation Needs: Not on file  ?Physical Activity: Not on file  ?Stress: Not on file  ?Social Connections: Not on  file  ?Intimate Partner Violence: Not on file  ? ? ? ?ROS- All systems are reviewed and negative except as per the HPI above. ? ?Physical Exam: ?Vitals:  ? 05/30/21 1327  ?BP: 118/88  ?Pulse: (!) 59  ?Weight: 85.5 kg  ?Height: '5\' 11"'$  (1.803 m)  ? ? ? ?GEN- The patient is a well appearing male, alert and oriented x 3 today.   ?HEENT-head normocephalic, atraumatic, sclera clear, conjunctiva pink, hearing intact, trachea midline. ?Lungs- Clear to ausculation bilaterally, normal work of breathing ?Heart- Regular rate and rhythm, no murmurs, rubs or gallops  ?GI- soft, NT, ND, + BS ?Extremities- no clubbing, cyanosis, or edema ?MS- no significant deformity or atrophy ?Skin- no rash or lesion ?Psych- euthymic mood, full affect ?Neuro- strength and sensation are intact ? ? ?Wt Readings from Last 3 Encounters:  ?05/30/21 85.5 kg  ?02/19/21 86.1 kg  ?11/22/20 84.6 kg  ? ? ?EKG today demonstrates  ?SB ?Vent. rate 59 BPM ?PR interval 180 ms ?QRS duration 84 ms ?QT/QTcB 386/382 ms ? ?Echo 09/29/18 demonstrated  ?1. The left ventricle has normal systolic function with an ejection  ?fraction of 60-65%. The cavity size was normal. Left ventricular diastolic parameters were normal.  ? 2. The right ventricle has normal systolic  function. The cavity was  ?normal. There is no increase in right ventricular wall thickness.  ? 3. Mild thickening of the mitral valve leaflet.  ? 4. The aortic valve is tricuspid. Mild thickening of the

## 2021-05-31 NOTE — Progress Notes (Signed)
Carelink Summary Report / Loop Recorder 

## 2021-06-17 ENCOUNTER — Ambulatory Visit (INDEPENDENT_AMBULATORY_CARE_PROVIDER_SITE_OTHER): Payer: Medicare Other

## 2021-06-17 DIAGNOSIS — I63411 Cerebral infarction due to embolism of right middle cerebral artery: Secondary | ICD-10-CM | POA: Diagnosis not present

## 2021-06-18 LAB — CUP PACEART REMOTE DEVICE CHECK
Date Time Interrogation Session: 20230508230726
Implantable Pulse Generator Implant Date: 20200821

## 2021-07-04 NOTE — Progress Notes (Signed)
Carelink Summary Report / Loop Recorder 

## 2021-07-18 ENCOUNTER — Ambulatory Visit (INDEPENDENT_AMBULATORY_CARE_PROVIDER_SITE_OTHER): Payer: Medicare Other

## 2021-07-18 DIAGNOSIS — I63411 Cerebral infarction due to embolism of right middle cerebral artery: Secondary | ICD-10-CM | POA: Diagnosis not present

## 2021-07-22 LAB — CUP PACEART REMOTE DEVICE CHECK
Date Time Interrogation Session: 20230610230843
Implantable Pulse Generator Implant Date: 20200821

## 2021-07-26 NOTE — Progress Notes (Signed)
Carelink Summary Report / Loop Recorder 

## 2021-08-19 ENCOUNTER — Ambulatory Visit (INDEPENDENT_AMBULATORY_CARE_PROVIDER_SITE_OTHER): Payer: Medicare Other

## 2021-08-19 DIAGNOSIS — I63411 Cerebral infarction due to embolism of right middle cerebral artery: Secondary | ICD-10-CM | POA: Diagnosis not present

## 2021-08-23 LAB — CUP PACEART REMOTE DEVICE CHECK
Date Time Interrogation Session: 20230713230459
Implantable Pulse Generator Implant Date: 20200821

## 2021-09-18 NOTE — Progress Notes (Signed)
Carelink Summary Report / Loop Recorder 

## 2021-09-25 ENCOUNTER — Ambulatory Visit (INDEPENDENT_AMBULATORY_CARE_PROVIDER_SITE_OTHER): Payer: Medicare Other

## 2021-09-25 DIAGNOSIS — I63411 Cerebral infarction due to embolism of right middle cerebral artery: Secondary | ICD-10-CM

## 2021-09-25 LAB — CUP PACEART REMOTE DEVICE CHECK
Date Time Interrogation Session: 20230815230914
Implantable Pulse Generator Implant Date: 20200821

## 2021-10-24 NOTE — Progress Notes (Signed)
Carelink Summary Report / Loop Recorder 

## 2021-10-28 ENCOUNTER — Ambulatory Visit (INDEPENDENT_AMBULATORY_CARE_PROVIDER_SITE_OTHER): Payer: Medicare Other

## 2021-10-28 DIAGNOSIS — I63411 Cerebral infarction due to embolism of right middle cerebral artery: Secondary | ICD-10-CM | POA: Diagnosis not present

## 2021-10-30 LAB — CUP PACEART REMOTE DEVICE CHECK
Date Time Interrogation Session: 20230917230426
Implantable Pulse Generator Implant Date: 20200821

## 2021-11-09 NOTE — Progress Notes (Signed)
Carelink Summary Report / Loop Recorder 

## 2021-12-02 ENCOUNTER — Ambulatory Visit (INDEPENDENT_AMBULATORY_CARE_PROVIDER_SITE_OTHER): Payer: Medicare Other

## 2021-12-02 DIAGNOSIS — I63411 Cerebral infarction due to embolism of right middle cerebral artery: Secondary | ICD-10-CM | POA: Diagnosis not present

## 2021-12-03 LAB — CUP PACEART REMOTE DEVICE CHECK
Date Time Interrogation Session: 20231022230959
Implantable Pulse Generator Implant Date: 20200821

## 2021-12-08 ENCOUNTER — Other Ambulatory Visit: Payer: Self-pay | Admitting: Physician Assistant

## 2021-12-24 NOTE — Progress Notes (Signed)
Carelink Summary Report / Loop Recorder 

## 2022-01-06 ENCOUNTER — Ambulatory Visit (INDEPENDENT_AMBULATORY_CARE_PROVIDER_SITE_OTHER): Payer: Medicare Other

## 2022-01-06 DIAGNOSIS — I63411 Cerebral infarction due to embolism of right middle cerebral artery: Secondary | ICD-10-CM

## 2022-01-07 LAB — CUP PACEART REMOTE DEVICE CHECK
Date Time Interrogation Session: 20231126231038
Implantable Pulse Generator Implant Date: 20200821

## 2022-01-19 ENCOUNTER — Ambulatory Visit
Admission: EM | Admit: 2022-01-19 | Discharge: 2022-01-19 | Disposition: A | Discharge status: Home / Self Care | Payer: Medicare Other | Attending: Physician Assistant | Admitting: Physician Assistant

## 2022-01-19 ENCOUNTER — Ambulatory Visit (INDEPENDENT_AMBULATORY_CARE_PROVIDER_SITE_OTHER): Payer: Medicare Other

## 2022-01-19 DIAGNOSIS — H65191 Other acute nonsuppurative otitis media, right ear: Secondary | ICD-10-CM

## 2022-01-19 DIAGNOSIS — R059 Cough, unspecified: Secondary | ICD-10-CM | POA: Diagnosis not present

## 2022-01-19 DIAGNOSIS — J209 Acute bronchitis, unspecified: Secondary | ICD-10-CM | POA: Diagnosis not present

## 2022-01-19 MED ORDER — PREDNISONE 20 MG PO TABS: 40.0000 mg | ORAL_TABLET | Freq: Every day | ORAL | 0 refills | Status: AC

## 2022-01-19 MED ORDER — AMOXICILLIN-POT CLAVULANATE 875-125 MG PO TABS: 1.0000 | ORAL_TABLET | Freq: Two times a day (BID) | ORAL | 0 refills | Status: DC

## 2022-01-19 NOTE — ED Triage Notes (Signed)
Patient presents to UC for cough and nasal congestion x 1 month. Right ear pain x 2 days. Treating with robitussin PM, Nyquil, Flonase with no improvement.   Denies SOB or fever.

## 2022-01-19 NOTE — ED Provider Notes (Signed)
[facility]    CSN: [id number] Arrival date & time: 01/19/22  1118      History   Chief Complaint Chief Complaint  Patient presents with   Nasal Congestion   Cough    HPI Shaun Ball is a 71 y.o. male.   Patient here today for evaluation of cough and congestion has had for the last month.  He states that he has recently started to have right ear pain as well.  Ear pain started 2 days ago.  He has been using over-the-counter medications including Robitussin, NyQuil, Flonase without improvement.  He has not had any fever.  He denies any shortness of breath.  The history is provided by the patient.  Cough Associated symptoms: no chills, no ear pain, no eye discharge, no fever, no shortness of breath and no sore throat     Past Medical History:  Diagnosis Date   Arthritis    Bladder cancer ([place])    CVA (cerebral vascular accident) ([place]) 09/2018   History of kidney stones 2012   passed / has renal cyst to evaluated at later time   Hypertension    Wears glasses     Patient Active Problem List   Diagnosis Date Noted   Atrial flutter ([place]) 04/06/2020   Secondary hypercoagulable state ([place]) 04/06/2020   Chest pain 10/22/2018   H/O: CVA (cerebrovascular accident) 10/22/2018   Bladder cancer ([place]) 10/22/2018   Confusion    Essential hypertension 09/29/2018   Abnormal liver function 09/29/2018   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 Dr Christian Mate. Polyp(s) of the right colon Minimal Hemorrhoidal disease.   CYSTOSCOPY N/A 11/05/2016   Procedure: CYSTOSCOPY;  Surgeon: Ceasar Mons, MD;  Location: [facility];  Service: Urology;   Laterality: N/A;   CYSTOSCOPY WITH BIOPSY Bilateral 10/24/2016   Procedure: CYSTOSCOPY WITH BLADDER BIOPSY/ BILATERAL RETROGRADE [id number];  Surgeon: Ceasar Mons, MD;  Location: [facility];  Service: Urology;  Laterality: Bilateral;   CYSTOSCOPY WITH BIOPSY N/A 05/12/2018   Procedure: CYSTOSCOPY WITH BLADDER BIOPSY/ BILATERAL RETROGRADE PYELOGRAM BIOPSY;  Surgeon: Ceasar Mons, MD;  Location: [facility];  Service: Urology;  Laterality: N/A;   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 left 03-25-2006;  right 07-07-2006   and Gastroc Slide   fattytumorremoved from back      LOOP RECORDER INSERTION N/A 10/01/2018   Procedure: LOOP RECORDER INSERTION;  Surgeon: Constance Haw, MD;  Location: [place] [facility];  Service: Cardiovascular;  Laterality: N/A;   TEE WITHOUT CARDIOVERSION N/A 10/01/2018   Procedure: TRANSESOPHAGEAL ECHOCARDIOGRAM (TEE);  Surgeon: Fay Records, MD;  Location: [place];  Service: Cardiovascular;  Laterality: N/A;   TRANSURETHRAL RESECTION OF BLADDER TUMOR N/A 11/05/2016   Procedure: TRANSURETHRAL RESECTION OF BLADDER TUMOR (TURBT);  Surgeon: Ceasar Mons, MD;  Location: [facility];  Service: Urology;  Laterality: N/A;       Home Medications    Prior to Admission medications   Medication Sig Start Date End Date Taking? Authorizing Provider  amoxicillin-clavulanate (AUGMENTIN) 875-125 MG tablet Take 1 tablet by mouth every 12 (twelve) hours. 01/19/22  Yes Francene Finders, PA-C  predniSONE (DELTASONE) 20 MG tablet Take 2 tablets (40 mg total) by mouth daily with breakfast for 5 days. 01/19/22 01/24/22 Yes Francene Finders, PA-C  acetaminophen (TYLENOL) 500 MG tablet Take 500 mg by mouth every 6 (six) hours as needed for moderate pain.    [provider]  diphenhydrAMINE (BENADRYL) 25 mg capsule Take 25 mg by mouth every 6 (six) hours as needed for allergies.     [provider]  DULoxetine (CYMBALTA) 30 MG capsule Take 30 mg by mouth daily.    [provider]  ELIQUIS 5 MG TABS tablet Take 1 tablet by mouth twice daily 12/09/21   Fenton, Clint R, PA  fluticasone (FLONASE) 50 MCG/ACT nasal spray Place 1 spray into both nostrils as needed for allergies or rhinitis.    [provider]  lisinopril (PRINIVIL,ZESTRIL) 40 MG tablet Take 40 mg by mouth every evening.     [provider]  Melatonin 5 MG TABS Take 5 mg by mouth at bedtime.    [provider]  methocarbamol (ROBAXIN) 500 MG tablet  11/16/19   [provider]  montelukast (SINGULAIR) 10 MG tablet Take 10 mg by mouth daily as needed (allergies).  07/14/17   [provider]  REPATHA SURECLICK 096 MG/ML SOAJ  12/01/19   [provider]  sildenafil (VIAGRA) 100 MG tablet Take 100 mg by mouth daily as needed for erectile dysfunction.    [provider]  tamsulosin (FLOMAX) 0.4 MG CAPS capsule Take 1 capsule (0.4 mg total) by mouth daily. 10/24/16   Ceasar Mons, MD    Family History Family History  Problem Relation Age of Onset   Blindness Mother    Cataracts Mother    Diabetes Mother    Glaucoma Mother    Macular degeneration Mother    Cataracts Father    Diabetes Father    Diabetes Sister    Diabetes Brother    Blindness Maternal Uncle    Macular degeneration Maternal Uncle    Macular degeneration Paternal Aunt    Amblyopia Neg Hx    Retinal detachment Neg Hx    Strabismus Neg Hx    Retinitis pigmentosa Neg Hx     Social History Social History   Tobacco Use   Smoking status: Former    Years: 28.00    Types: Cigarettes    Quit date: 10/22/1999    Years since quitting: 22.2   Smokeless tobacco: Never   Tobacco comments:    Former smoker 11/22/2020  Vaping Use   Vaping Use: Never used  Substance Use Topics   Alcohol use: Yes    Alcohol/week: 5.0 - 6.0 standard drinks of alcohol    Types:  5 - 6 Cans of beer per week    Comment: 5-6 beers in a sitting 05/30/21   Drug use: No     Allergies   Statins and Iohexol   Review of Systems Review of Systems  Constitutional:  Negative for chills and fever.  HENT:  Positive for congestion. Negative for ear pain and sore throat.   Eyes:  Negative for discharge and redness.  Respiratory:  Positive for cough. Negative for shortness of breath.   Gastrointestinal:  Negative for abdominal pain, nausea and vomiting.     Physical Exam Triage Vital Signs ED Triage Vitals [01/19/22 1337]  Enc Vitals Group     BP 132/78     Pulse Rate 73     Resp 16  Temp 97.9 F (36.6 C)     Temp Source Oral     SpO2 97 %     Weight      Height      Head Circumference      Peak Flow      Pain Score 0     Pain Loc      Pain Edu?      Excl. in [place]?    No data found.  Updated Vital Signs BP 132/78 (BP Location: Left Arm)   Pulse 73   Temp 97.9 F (36.6 C) (Oral)   Resp 16   SpO2 97%      Physical Exam Vitals and nursing note reviewed.  Constitutional:      General: He is not in acute distress.    Appearance: Normal appearance. He is not ill-appearing.  HENT:     Head: Normocephalic and atraumatic.     Left Ear: Tympanic membrane normal.     Ears:     Comments: Right TM erythematous    Nose: Congestion present.     Mouth/Throat:     Mouth: Mucous membranes are moist.     Pharynx: Oropharynx is clear. No oropharyngeal exudate or posterior oropharyngeal erythema.  Eyes:     Conjunctiva/sclera: Conjunctivae normal.  Cardiovascular:     Rate and Rhythm: Normal rate and regular rhythm.     Heart sounds: Normal heart sounds. No murmur heard. Pulmonary:     Effort: Pulmonary effort is normal. No respiratory distress.     Breath sounds: Normal breath sounds. No wheezing, rhonchi or rales.  Skin:    General: Skin is warm and dry.  Neurological:     Mental Status: He is alert.  Psychiatric:        Mood and Affect: Mood  normal.        Thought Content: Thought content normal.      UC Treatments / Results  Labs (all labs ordered are listed, but only abnormal results are displayed) Labs Reviewed - No data to display  EKG   Radiology DG Chest 2 View  Result Date: 01/19/2022 CLINICAL DATA:  Cough for 1 month. EXAM: CHEST - 2 VIEW COMPARISON:  10/22/2018. FINDINGS: The cardiac silhouette is normal in size and configuration. Normal mediastinal and hilar contours. Clear lungs.  No pleural effusion or pneumothorax. Skeletal structures are intact. IMPRESSION: No active cardiopulmonary disease. Electronically Signed   By: Lajean Manes M.D.   On: 01/19/2022 13:59    Procedures Procedures (including critical care time)  Medications Ordered in UC Medications - No data to display  Initial Impression / Assessment and Plan / UC Course  I have reviewed the triage vital signs and the nursing notes.  Pertinent labs & imaging results that were available during my care of the patient were reviewed by me and considered in my medical decision making (see chart for details).    Will treat to cover bronchitis with steroid burst.  Antibiotic prescribed to treat otitis media.  Recommended follow-up if no gradual improvement or with any further concerns.  Final Clinical Impressions(s) / UC Diagnoses   Final diagnoses:  Acute bronchitis, unspecified organism  Other acute nonsuppurative otitis media of right ear, recurrence not specified   Discharge Instructions   None    ED Prescriptions     Medication Sig Dispense Auth. Provider   predniSONE (DELTASONE) 20 MG tablet Take 2 tablets (40 mg total) by mouth daily with breakfast for 5 days. 10 tablet  Francene Finders, PA-C   amoxicillin-clavulanate (AUGMENTIN) 875-125 MG tablet Take 1 tablet by mouth every 12 (twelve) hours. 14 tablet Francene Finders, PA-C      PDMP not reviewed this encounter.   Francene Finders, PA-C 01/19/22 1432

## 2022-01-29 ENCOUNTER — Encounter: Payer: Self-pay | Admitting: Gastroenterology

## 2022-01-29 ENCOUNTER — Telehealth: Payer: Self-pay

## 2022-01-29 ENCOUNTER — Ambulatory Visit: Payer: Medicare Other | Admitting: Gastroenterology

## 2022-01-29 VITALS — BP 120/68 | HR 73 | Ht 71.0 in | Wt 192.1 lb

## 2022-01-29 DIAGNOSIS — Z86010 Personal history of colonic polyps: Secondary | ICD-10-CM | POA: Diagnosis not present

## 2022-01-29 NOTE — Telephone Encounter (Signed)
   Name: Shaun Ball  DOB: 09/30/1950  MRN: [id number]  Primary Cardiologist: Dr. Curt Bears  Chart reviewed as part of pre-operative protocol coverage. It looks like patient primarily follows in the [facility] but has not been seen by anyone in our in several years (since 2020). Therefore, he will require a follow-up in-office visit in order to better assess preoperative cardiovascular risk.  Pre-op covering staff: - Please schedule appointment and call patient to inform them. If patient already had an upcoming appointment within acceptable timeframe, please add "pre-op clearance" to the appointment notes so provider is aware. - Please contact requesting surgeon's office via preferred method (i.e, phone, fax) to inform them of need for appointment prior to surgery.  This message will also be routed to pharmacy pool for input of holding Eliquis as requested below so that this information is available to the clearing provider at time of patient's appointment.   Will remove from pre-op pool.  Darreld Mclean, PA-C  01/29/2022, 11:35 AM

## 2022-01-29 NOTE — Telephone Encounter (Signed)
Pt has been schedule to see Tommye Standard, [facility] 03/10/22 @ 8:50 for pre op clearance. Pt thanked me for the call and the help. I will update the requesting office the pt has appt 03/10/22.

## 2022-01-29 NOTE — Progress Notes (Signed)
Chief Complaint: For colonoscopy  Referring Provider:  Reynold Bowen, MD      ASSESSMENT AND PLAN;   #1. H/O polyps  #2. A Fib/CVA on eliquis  Plan: -Colon after cardio clearence and hold eliquis x 24 hrs.   Discussed risks & benefits of colonoscopy. Risks including rare perforation req laparotomy, bleeding after bx/polypectomy req blood transfusion, rarely missing neoplasms, risks of anesthesia/sedation, rare risk of damage to internal organs. Benefits outweigh the risks. Patient agrees to proceed. All the questions were answered. Pt consents to proceed. HPI:    Shaun Ball is a 71 y.o. male  With A Fib/CVA on eliquis, HTN, Nl EF 60-65%  For colonoscopy  No nausea, vomiting, heartburn, regurgitation, odynophagia or dysphagia.  No significant diarrhea or constipation.  No melena or hematochezia. No unintentional weight loss. No abdominal pain.  H/O polyps.   Previous GI workup: Colonoscopy 10/2007 at [facility] colonic polyp s/p polypectomy, internal hemorrhoids. Bx- TA Rpt 5 yrs   Past Medical History:  Diagnosis Date   Arthritis    Bladder cancer ([place])    CVA (cerebral vascular accident) ([place]) 09/2018   History of kidney stones 2012   passed / has renal cyst to evaluated at later time   Hypertension    Wears glasses     Past Surgical History:  Procedure Laterality Date   BUBBLE STUDY  10/01/2018   Procedure: BUBBLE STUDY;  Surgeon: Fay Records, MD;  Location: [facility];  Service: Cardiovascular;;   COLONOSCOPY  10/25/2007   [facility]. Dr Christian Mate. Polyp(s) of the right colon Minimal Hemorrhoidal disease.   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 left 03-25-2006;  right 07-07-2006   and Gastroc Slide   fattytumorremoved from back      LOOP RECORDER INSERTION N/A 10/01/2018   Procedure: LOOP RECORDER INSERTION;  Surgeon: Constance Haw, MD;  Location: [place] [facility];  Service: Cardiovascular;  Laterality: N/A;   TEE WITHOUT CARDIOVERSION N/A 10/01/2018   Procedure: TRANSESOPHAGEAL ECHOCARDIOGRAM (TEE);  Surgeon: Fay Records, MD;  Location: [place];  Service: Cardiovascular;  Laterality: N/A;   TRANSURETHRAL RESECTION OF BLADDER TUMOR N/A 11/05/2016   Procedure: TRANSURETHRAL RESECTION OF BLADDER TUMOR (TURBT);  Surgeon: Ceasar Mons, MD;  Location: [facility];  Service: Urology;  Laterality: N/A;    Family History  Problem Relation Age of Onset   Blindness Mother    Cataracts Mother    Diabetes Mother    Glaucoma Mother    Macular degeneration Mother    Cataracts Father    Diabetes Father    Diabetes Sister    Diabetes Brother    Blindness Maternal Uncle    Macular degeneration Maternal Uncle    Macular degeneration Paternal Aunt    Amblyopia Neg Hx    Retinal detachment Neg Hx    Strabismus Neg Hx    Retinitis  pigmentosa Neg Hx     Social History   Tobacco Use   Smoking status: Former    Years: 28.00    Types: Cigarettes    Quit date: 10/22/1999    Years since quitting: 22.2   Smokeless tobacco: Never   Tobacco comments:    Former smoker 11/22/2020  Vaping Use   Vaping Use: Never used  Substance Use Topics   Alcohol use: Yes    Alcohol/week: 5.0 - 6.0 standard drinks of alcohol     Types: 5 - 6 Cans of beer per week    Comment: 5-6 beers in a sitting 05/30/21   Drug use: No    Current Outpatient Medications  Medication Sig Dispense Refill   acetaminophen (TYLENOL) 500 MG tablet Take 500 mg by mouth every 6 (six) hours as needed for moderate pain.     amoxicillin-clavulanate (AUGMENTIN) 875-125 MG tablet Take 1 tablet by mouth every 12 (twelve) hours. 14 tablet 0   diphenhydrAMINE (BENADRYL) 25 mg capsule Take 25 mg by mouth every 6 (six) hours as needed for allergies.     DULoxetine (CYMBALTA) 30 MG capsule Take 30 mg by mouth daily.     ELIQUIS 5 MG TABS tablet Take 1 tablet by mouth twice daily 60 tablet 11   fluticasone (FLONASE) 50 MCG/ACT nasal spray Place 1 spray into both nostrils as needed for allergies or rhinitis.     lisinopril (PRINIVIL,ZESTRIL) 40 MG tablet Take 40 mg by mouth every evening.      Melatonin 5 MG TABS Take 5 mg by mouth at bedtime.     methocarbamol (ROBAXIN) 500 MG tablet      montelukast (SINGULAIR) 10 MG tablet Take 10 mg by mouth daily as needed (allergies).   6   REPATHA SURECLICK 109 MG/ML SOAJ      sildenafil (VIAGRA) 100 MG tablet Take 100 mg by mouth daily as needed for erectile dysfunction.     tamsulosin (FLOMAX) 0.4 MG CAPS capsule Take 1 capsule (0.4 mg total) by mouth daily. 30 capsule 11   No current facility-administered medications for this visit.    Allergies  Allergen Reactions   Statins Hives and Rash    whelps,liver enzymes elevated   Iohexol Other (See Comments)    Pt had sneezing after his CT with IV contrast, lasting only 10- 15 minutes, no intervention was needed, per radiologist    Review of Systems:  neg Psychiatric/Behavioral: H/O anxiety or depression     Physical Exam:    BP 120/68 (BP Location: Left Arm, Patient Position: Sitting, Cuff Size: Normal)   Pulse 73   Ht '5\' 11"'$  (1.803 m)   Wt 192 lb 2 oz (87.1 kg)   SpO2 96%   BMI 26.80 kg/m  Wt Readings from Last 3 Encounters:  01/29/22  192 lb 2 oz (87.1 kg)  05/30/21 188 lb 9.6 oz (85.5 kg)  02/19/21 189 lb 12.8 oz (86.1 kg)   Constitutional:  Well-developed, in no acute distress. Psychiatric: Normal mood and affect. Behavior is normal. HEENT: Pupils normal.  Conjunctivae are normal. No scleral icterus. Cardiovascular: Normal rate, regular rhythm. No edema.  Has implantable rhythm monitor. Pulmonary/chest: Effort normal and breath sounds normal. No wheezing, rales or rhonchi. Abdominal: Soft, nondistended. Nontender. Bowel sounds active throughout. There are no masses palpable. No hepatomegaly. Rectal: Deferred Neurological: Alert and oriented to person place and time. Skin: Skin is warm and dry. No rashes noted.  Data Reviewed: I have personally reviewed following labs  and imaging studies  CBC:    Latest Ref Rng & Units 05/30/2021    1:42 PM 05/23/2020   11:30 AM 10/22/2018    9:49 AM  CBC  WBC 4.0 - 10.5 K/uL 5.9  6.0  6.6   Hemoglobin 13.0 - 17.0 g/dL 15.3  16.6  16.3   Hematocrit 39.0 - 52.0 % 46.7  50.1  49.6   Platelets 150 - 400 K/uL 178  206  202     CMP:    Latest Ref Rng & Units 05/30/2021    1:42 PM 05/23/2020   11:30 AM 10/22/2018    9:49 AM  CMP  Glucose 70 - 99 mg/dL 117  91  103   BUN 8 - 23 mg/dL '17  14  18   '$ Creatinine 0.61 - 1.24 mg/dL 0.94  0.96  1.08   Sodium 135 - 145 mmol/L 138  139  136   Potassium 3.5 - 5.1 mmol/L 4.4  4.1  4.5   Chloride 98 - 111 mmol/L 109  106  102   CO2 22 - 32 mmol/L '22  28  25   '$ Calcium 8.9 - 10.3 mg/dL 8.9  9.2  9.7       Radiology Studies: DG Chest 2 View  Result Date: 01/19/2022 CLINICAL DATA:  Cough for 1 month. EXAM: CHEST - 2 VIEW COMPARISON:  10/22/2018. FINDINGS: The cardiac silhouette is normal in size and configuration. Normal mediastinal and hilar contours. Clear lungs.  No pleural effusion or pneumothorax. Skeletal structures are intact. IMPRESSION: No active cardiopulmonary disease. Electronically Signed   By: Lajean Manes M.D.   On:  01/19/2022 13:59   CUP PACEART REMOTE DEVICE CHECK  Result Date: 01/07/2022 ILR summary report received. Battery status OK. Normal device function. No new symptom, tachy, brady, or pause episodes. No new AF episodes. Monthly summary reports and ROV/PRN LA     Carmell Austria, MD 01/29/2022, 10:45 AM  Cc: Reynold Bowen, MD

## 2022-01-29 NOTE — Telephone Encounter (Signed)
 [facility] Pre-operative Risk Assessment     Request for surgical clearance:     Endoscopy Procedure  What type of surgery is being performed?     Colonoscopy  When is this surgery scheduled?     04-07-2022  What type of clearance is required ?   Pharmacy  Are there any medications that need to be held prior to surgery and how long? Eliquis 24 hour hold  Practice name and name of physician performing surgery?      [facility]  What is your office phone and fax number?      Phone- [phone]  [phone]  Anesthesia type (None, local, MAC, general) ?       MAC

## 2022-01-29 NOTE — Patient Instructions (Signed)
_______________________________________________________  If you are age 72 or older, your body mass index should be between 23-30. Your Body mass index is 26.8 kg/m. If this is out of the aforementioned range listed, please consider follow up with your Primary Care Provider.  If you are age 51 or younger, your body mass index should be between 19-25. Your Body mass index is 26.8 kg/m. If this is out of the aformentioned range listed, please consider follow up with your Primary Care Provider.   ________________________________________________________  The  GI providers would like to encourage you to use [facility] to communicate with providers for non-urgent requests or questions.  Due to long hold times on the telephone, sending your provider a message by [facility] may be a faster and more efficient way to get a response.  Please allow 48 business hours for a response.  Please remember that this is for non-urgent requests.  _______________________________________________________  Dennis Bast have been scheduled for a colonoscopy. Please follow written instructions given to you at your visit today.  Please pick up your prep supplies at the pharmacy within the next 1-3 days. If you use inhalers (even only as needed), please bring them with you on the day of your procedure.  We have given you samples of the following medication to take: Clenpiq  You will be contacted by our office prior to your procedure for directions on holding your Eliquis.  If you do not hear from our office 2 week prior to your scheduled procedure, please call [phone] to discuss.    Thank you,  Dr. Jackquline Denmark

## 2022-01-30 NOTE — Telephone Encounter (Signed)
Cleared by cardiology RG

## 2022-01-30 NOTE — Telephone Encounter (Signed)
Patient with diagnosis of aflutter on Eliquis for anticoagulation.    Procedure: colonoscopy Date of procedure: 04/07/22  CHA2DS2-VASc Score = 4  This indicates a 4.8% annual risk of stroke. The patient's score is based upon: CHF History: 0 HTN History: 1 Diabetes History: 0 Stroke History: 2 Vascular Disease History: 0 Age Score: 1 Gender Score: 0   CrCl 70m/min Platelet count 178K  Per office protocol, patient can hold Eliquis for 1 day prior to procedure as requested.    **This guidance is not considered finalized until pre-operative APP has relayed final recommendations.**

## 2022-01-30 NOTE — Telephone Encounter (Signed)
Pt made aware of being cleared by Cardiology and patient can hold Eliquis for 1 day prior to procedure as requested.    Pt states that he has an appointment on Jan 29.2024 to see his cardiologist:  Pt verbalized understanding with all questions answered.

## 2022-02-11 ENCOUNTER — Ambulatory Visit (INDEPENDENT_AMBULATORY_CARE_PROVIDER_SITE_OTHER): Payer: Medicare Other

## 2022-02-11 DIAGNOSIS — I63411 Cerebral infarction due to embolism of right middle cerebral artery: Secondary | ICD-10-CM

## 2022-02-11 LAB — CUP PACEART REMOTE DEVICE CHECK
Date Time Interrogation Session: 20240101231640
Implantable Pulse Generator Implant Date: 20200821

## 2022-02-17 NOTE — Progress Notes (Signed)
Carelink Summary Report / Loop Recorder 

## 2022-03-08 NOTE — Progress Notes (Unsigned)
Cardiology Office Note Date:  10/11/2018  Patient ID:  Shaun Ball, Shaun Ball [birth year], MRN [id number] PCP:  Reynold Bowen, MD  Electrophysiologist:  Dr. Curt Bears   Chief Complaint:   ILR site check  History of Present Illness: Shaun Ball is a 72 y.o. male with history of HTN, bladder cancer, cryptogenic stroke, Aug 2020 and underwent ILR implant >> AFlutter.  Has been followed in the AFib clinic since Feb 2020 with finding of Aflutter  Last seen by the AFib clinic 05/30/21, feeling well, low AFlutter burden (zero), no changes were made.  Pharmacy has addressed Eliquis question for his colonoscopy, was the only clearance requested though given an EP in clinic visit for further evaluation  RCRI score is one (0.9%)  *** burden *** symptoms *** bleeding, eliquis, dose, labs   Device information: MDT ILR, implanted 10/01/2018, cryptogenic stroke  Afib/AAD hx AFlutter noted via loop Feb 2020   Past Medical History:  Diagnosis Date   Arthritis    Bladder cancer [facility])    Dysuria    History of kidney stones 2012   passed / has renal cyst to evaluated at later time   Hypertension    Lesion of bladder    Pneumonia    hx of    PONV (postoperative nausea and vomiting)    10 yrs ago  no problems with last 2 surgeries   Wears glasses     Past Surgical History:  Procedure Laterality Date   BUBBLE STUDY  10/01/2018   Procedure: BUBBLE STUDY;  Surgeon: Fay Records, MD;  Location: [place];  Service: Cardiovascular;;   CYSTOSCOPY N/A 11/05/2016   Procedure: Consuela Mimes;  Surgeon: Ceasar Mons, MD;  Location: [facility];  Service: Urology;  Laterality: N/A;   CYSTOSCOPY WITH BIOPSY Bilateral 10/24/2016   Procedure: CYSTOSCOPY WITH BLADDER BIOPSY/ BILATERAL RETROGRADE GGYIRSWN;  Surgeon: Ceasar Mons, MD;  Location[facility];  Service: Urology;  Laterality: Bilateral;   CYSTOSCOPY WITH BIOPSY N/A 05/12/2018    Procedure: CYSTOSCOPY WITH BLADDER BIOPSY/ BILATERAL RETROGRADE PYELOGRAM BIOPSY;  Surgeon: Ceasar Mons, MD;  Location: [facility];  Service: Urology;  Laterality: N/A;   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 left 03-25-2006;  right 07-07-2006   and Gastroc Slide   fattytumorremoved from back      [place] N/A 10/01/2018   Procedure: [place];  Surgeon: Constance Haw, MD;  Location: [place] [facility];  Service: Cardiovascular;  Laterality: N/A;   TEE WITHOUT CARDIOVERSION N/A 10/01/2018   Procedure: TRANSESOPHAGEAL ECHOCARDIOGRAM (TEE);  Surgeon: Fay Records, MD;  Location: [place];  Service: Cardiovascular;  Laterality: N/A;   TRANSURETHRAL RESECTION OF BLADDER TUMOR N/A 11/05/2016   Procedure: TRANSURETHRAL RESECTION OF BLADDER TUMOR (TURBT);  Surgeon: Ceasar Mons, MD;  Location: [facility];  Service: Urology;  Laterality: N/A;    Current Outpatient Medications  Medication Sig Dispense Refill   acetaminophen (TYLENOL) 500 MG tablet Take 500 mg by mouth every 6 (six) hours as needed for moderate pain.     aspirin 81 MG chewable tablet Chew 1 tablet (81 mg total) by mouth daily. 60 tablet 1   atorvastatin (LIPITOR) 40 MG tablet Take 1 tablet (40 mg total) by mouth daily at 6 PM. 60 tablet 1   clopidogrel (PLAVIX) 75 MG tablet Take 1 tablet (75 mg total) by mouth daily for 21 days. 21 tablet 0   fluticasone (FLONASE) 50  MCG/ACT nasal spray Place 1 spray into both nostrils as needed for allergies or rhinitis.     lisinopril (PRINIVIL,ZESTRIL) 40 MG tablet Take 40 mg by mouth every evening.      Melatonin 5 MG TABS Take 5 mg by mouth at bedtime.     montelukast (SINGULAIR) 10 MG tablet Take 10 mg by mouth daily as needed (allergies).   6   oxybutynin (DITROPAN) 5 MG tablet Take 1 tablet (5 mg total) by mouth every 8 (eight) hours as needed for bladder spasms. 30 tablet 0    phenazopyridine (PYRIDIUM) 200 MG tablet Take 1 tablet (200 mg total) by mouth 3 (three) times daily as needed (for pain with urination). 30 tablet 0   tamsulosin (FLOMAX) 0.4 MG CAPS capsule Take 1 capsule (0.4 mg total) by mouth daily. (Patient taking differently: Take 0.4 mg by mouth daily after breakfast. ) 30 capsule 11   No current facility-administered medications for this visit.     Allergies:   Statins and Iohexol   Social History:  The patient  reports that he quit smoking about 18 years ago. His smoking use included cigarettes. He quit after 28.00 years of use. He has never used smokeless tobacco. He reports current alcohol use of about 7.0 standard drinks of alcohol per week. He reports that he does not use drugs.   Family History:  The patient's family history includes Blindness in his maternal uncle and mother; Cataracts in his father and mother; Diabetes in his brother, father, mother, and sister; Glaucoma in his mother; Macular degeneration in his maternal uncle, mother, and paternal aunt.  ROS:  Please see the history of present illness.  All other systems are reviewed and otherwise negative.   PHYSICAL EXAM:  VS:  BP 128/68   Pulse 74   Ht '5\' 10"'$  (1.778 m)   Wt 182 lb (82.6 kg)   BMI 26.11 kg/m  BMI: Body mass index is 26.11 kg/m. Well nourished, well developed, in no acute distress  HEENT: normocephalic, atraumatic  Neck: no JVD, carotid bruits or masses Cardiac:  *** RRR; no significant murmurs, no rubs, or gallops Lungs: *** CTA b/, no wheezing, rhonchi or rales  Abd: soft, nontender MS: no deformity or atrophy Ext: *** no edema  Skin: warm and dry, no rash Neuro:  No gross deficits appreciated Psych: euthymic mood, full affect  *** ILR site is ***   EKG: done today and reviewed by myself: ***  ILR interrogation done today and reviewed by myself:  ***   Echo 09/29/18  1. The left ventricle has normal systolic function with an ejection  fraction of  60-65%. The cavity size was normal. Left ventricular diastolic  parameters were normal.   2. The right ventricle has normal systolic function. The cavity was  normal. There is no increase in right ventricular wall thickness.   3. Mild thickening of the mitral valve leaflet.   4. The aortic valve is tricuspid. Mild thickening of the aortic valve.  Sclerosis without any evidence of stenosis of the aortic valve.   5. The aorta is normal unless otherwise noted.   Recent Labs: 09/29/2018: Hemoglobin 15.7; Platelets 169; TSH 1.190 09/30/2018: ALT 61; BUN 11; Creatinine, Ser 1.14; Potassium 4.0; Sodium 138  09/29/2018: Cholesterol 289; HDL 34; LDL Cholesterol 219; Total CHOL/HDL Ratio 8.5; Triglycerides 181; VLDL 36   Estimated Creatinine Clearance: 63.1 mL/min (by C-G formula based on SCr of 1.14 mg/dL).   Wt Readings from Last 3 Encounters:  10/11/18 182 lb (82.6 kg)  09/30/18 185 lb 3 oz (84 kg)  05/12/18 186 lb (84.4 kg)     Other studies reviewed: Additional studies/records reviewed today include: summarized above  ASSESSMENT AND PLAN:  1. Aflutter No EKGs w/flutter, unknown type CHA2DS2Vasc is 5, on Eliquis, *** appropriately dosed *** % burden ***   2. HTN ***  3. Pre-procedure evaluation Low cardiac risk procedure Low cardiac risk score ***    Disposition: ***   Current medicines are reviewed at length with the patient today.  The patient did not have any concerns regarding medicines.  Venetia Night, PA-C 10/11/2018 11:44 AM     [facility] [place] [address] [phone] (office)  [phone] (fax)

## 2022-03-10 ENCOUNTER — Ambulatory Visit: Payer: Medicare Other | Attending: Physician Assistant | Admitting: Physician Assistant

## 2022-03-10 ENCOUNTER — Encounter: Payer: Self-pay | Admitting: Physician Assistant

## 2022-03-10 VITALS — BP 122/70 | HR 66 | Ht 71.0 in | Wt 189.0 lb

## 2022-03-10 DIAGNOSIS — I4892 Unspecified atrial flutter: Secondary | ICD-10-CM | POA: Diagnosis not present

## 2022-03-10 DIAGNOSIS — Z01818 Encounter for other preprocedural examination: Secondary | ICD-10-CM

## 2022-03-10 DIAGNOSIS — I10 Essential (primary) hypertension: Secondary | ICD-10-CM

## 2022-03-10 DIAGNOSIS — Z4509 Encounter for adjustment and management of other cardiac device: Secondary | ICD-10-CM

## 2022-03-10 LAB — CUP PACEART INCLINIC DEVICE CHECK
Date Time Interrogation Session: 20240129132337
Implantable Pulse Generator Implant Date: 20200821

## 2022-03-10 NOTE — Patient Instructions (Signed)
Medication Instructions:   Your physician recommends that you continue on your current medications as directed. Please refer to the Current Medication list given to you today.  *If you need a refill on your cardiac medications before your next appointment, please call your pharmacy*   Lab Work: [place]   If you have labs (blood work) drawn today and your tests are completely normal, you will receive your results only by: [place] (if you have MyChart) OR A paper copy in the mail If you have any lab test that is abnormal or we need to change your treatment, we will call you to review the results.   Testing/Procedures: NONE ORDERED  TODAY   Follow-Up: At [facility], you and your health needs are our priority.  As part of our continuing mission to provide you with exceptional heart care, we have created designated Provider Care Teams.  These Care Teams include your primary Cardiologist (physician) and Advanced Practice Providers (APPs -  Physician Assistants and Nurse Practitioners) who all work together to provide you with the care you need, when you need it.  We recommend signing up for the patient portal called "MyChart".  Sign up information is provided on this After Visit Summary.  MyChart is used to connect with patients for Virtual Visits (Telemedicine).  Patients are able to view lab/test results, encounter notes, upcoming appointments, etc.  Non-urgent messages can be sent to your provider as well.   To learn more about what you can do with MyChart, go to [email or web address].    Your next appointment:   1 year(s)  Provider:   You may see Dr. Curt Bears or one of the following Advanced Practice Providers on your designated Care Team:   Tommye Standard, [place]  Other Instructions

## 2022-03-13 NOTE — Progress Notes (Signed)
Carelink Summary Report / Loop Recorder

## 2022-03-16 LAB — CUP PACEART REMOTE DEVICE CHECK
Date Time Interrogation Session: 20240203230455
Implantable Pulse Generator Implant Date: 20200821

## 2022-03-17 ENCOUNTER — Ambulatory Visit: Payer: Medicare Other

## 2022-03-17 DIAGNOSIS — I63411 Cerebral infarction due to embolism of right middle cerebral artery: Secondary | ICD-10-CM

## 2022-04-01 ENCOUNTER — Telehealth: Payer: Self-pay | Admitting: Gastroenterology

## 2022-04-01 NOTE — Telephone Encounter (Signed)
Inbound call from patient stating that he is scheduled for a colonoscopy on 2/26 and is wanting to discuss how long he needs to hold Eliquis. Please advise.

## 2022-04-01 NOTE — Telephone Encounter (Signed)
Pt questioned when he should start holding his Eliquis.  Chart reviewed and noted from previous notes:  "Pt made aware of being cleared by Cardiology and patient can hold Eliquis for 1 day prior to procedure as requested. " Pt made aware of previous documentation.   Pt verbalized understanding with all questions answered.

## 2022-04-07 ENCOUNTER — Ambulatory Visit (AMBULATORY_SURGERY_CENTER): Payer: Medicare Other | Admitting: Gastroenterology

## 2022-04-07 ENCOUNTER — Encounter: Payer: Self-pay | Admitting: Gastroenterology

## 2022-04-07 VITALS — BP 109/52 | HR 57 | Temp 98.6°F | Resp 12 | Ht 71.0 in | Wt 192.0 lb

## 2022-04-07 DIAGNOSIS — D123 Benign neoplasm of transverse colon: Secondary | ICD-10-CM

## 2022-04-07 DIAGNOSIS — Z09 Encounter for follow-up examination after completed treatment for conditions other than malignant neoplasm: Secondary | ICD-10-CM | POA: Diagnosis not present

## 2022-04-07 DIAGNOSIS — Z86010 Personal history of colonic polyps: Secondary | ICD-10-CM

## 2022-04-07 DIAGNOSIS — D122 Benign neoplasm of ascending colon: Secondary | ICD-10-CM

## 2022-04-07 MED ORDER — SODIUM CHLORIDE 0.9 % IV SOLN: 500.0000 mL | Freq: Once | INTRAVENOUS | Status: DC

## 2022-04-07 NOTE — Patient Instructions (Addendum)
Resume previous medications today, except eliquis, resume Eliquis on 04/09/22 (Wednesday) Resume previous diet today. Await pathology results High fiber diet   Information given for hemorrhoids, polyps, high fiber diet and diverticulosis.  YOU HAD AN ENDOSCOPIC PROCEDURE TODAY AT [facility]:   Refer to the procedure report that was given to you for any specific questions about what was found during the examination.  If the procedure report does not answer your questions, please call your gastroenterologist to clarify.  If you requested that your care partner not be given the details of your procedure findings, then the procedure report has been included in a sealed envelope for you to review at your convenience later.  YOU SHOULD EXPECT: Some feelings of bloating in the abdomen. Passage of more gas than usual.  Walking can help get rid of the air that was put into your GI tract during the procedure and reduce the bloating. If you had a lower endoscopy (such as a colonoscopy or flexible sigmoidoscopy) you may notice spotting of blood in your stool or on the toilet paper. If you underwent a bowel prep for your procedure, you may not have a normal bowel movement for a few days.  Please Note:  You might notice some irritation and congestion in your nose or some drainage.  This is from the oxygen used during your procedure.  There is no need for concern and it should clear up in a day or so.  SYMPTOMS TO REPORT IMMEDIATELY:  Following lower endoscopy (colonoscopy):  Excessive amounts of blood in the stool  Significant tenderness or worsening of abdominal pains  Swelling of the abdomen that is new, acute  Fever of 100F or higher  For urgent or emergent issues, a gastroenterologist can be reached at any hour by calling [phone]. Do not use MyChart messaging for urgent concerns.   DIET:  We do recommend a small meal at first, but then you may proceed to your regular diet.   Drink plenty of fluids but you should avoid alcoholic beverages for 24 hours.  ACTIVITY:  You should plan to take it easy for the rest of today and you should NOT DRIVE or use heavy machinery until tomorrow (because of the sedation medicines used during the test).    FOLLOW UP: Our staff will call the number listed on your records the next business day following your procedure.  We will call around 7:15- 8:00 am to check on you and address any questions or concerns that you may have regarding the information given to you following your procedure. If we do not reach you, we will leave a message.     If any biopsies were taken you will be contacted by phone or by letter within the next 1-3 weeks.  Please call us at [phone] if you have not heard about the biopsies in 3 weeks.    SIGNATURES/CONFIDENTIALITY: You and/or your care partner have signed paperwork which will be entered into your electronic medical record.  These signatures attest to the fact that that the information above on your After Visit Summary has been reviewed and is understood.  Full responsibility of the confidentiality of this discharge information lies with you and/or your care-partner.

## 2022-04-07 NOTE — Progress Notes (Signed)
Chief Complaint: For colonoscopy  Referring Provider:  Reynold Bowen, MD      ASSESSMENT AND PLAN;   #1. H/O polyps  #2. A Fib/CVA on eliquis  Plan: -Colon after cardio clearence and hold eliquis x 24 hrs.   Discussed risks & benefits of colonoscopy. Risks including rare perforation req laparotomy, bleeding after bx/polypectomy req blood transfusion, rarely missing neoplasms, risks of anesthesia/sedation, rare risk of damage to internal organs. Benefits outweigh the risks. Patient agrees to proceed. All the questions were answered. Pt consents to proceed. HPI:    Shaun Ball is a 72 y.o. male  With A Fib/CVA on eliquis, HTN, Nl EF 60-65%  For colonoscopy  No nausea, vomiting, heartburn, regurgitation, odynophagia or dysphagia.  No significant diarrhea or constipation.  No melena or hematochezia. No unintentional weight loss. No abdominal pain.  H/O polyps.   Previous GI workup: Colonoscopy 10/2007 at [facility] colonic polyp s/p polypectomy, internal hemorrhoids. Bx- TA Rpt 5 yrs   Past Medical History:  Diagnosis Date   Arthritis    Bladder cancer ([place])    CVA (cerebral vascular accident) ([place]) 09/2018   History of kidney stones 2012   passed / has renal cyst to evaluated at later time   Hypertension    Wears glasses     Past Surgical History:  Procedure Laterality Date   BUBBLE STUDY  10/01/2018   Procedure: BUBBLE STUDY;  Surgeon: Fay Records, MD;  Location: [facility];  Service: Cardiovascular;;   COLONOSCOPY  10/25/2007   [facility]. Dr Christian Mate. Polyp(s) of the right colon Minimal Hemorrhoidal disease.   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 left 03-25-2006;  right 07-07-2006   and Gastroc Slide   fattytumorremoved from back      LOOP RECORDER INSERTION N/A 10/01/2018   Procedure: LOOP RECORDER INSERTION;  Surgeon: Constance Haw, MD;  Location: [place] [facility];  Service: Cardiovascular;  Laterality: N/A;   TEE WITHOUT CARDIOVERSION N/A 10/01/2018   Procedure: TRANSESOPHAGEAL ECHOCARDIOGRAM (TEE);  Surgeon: Fay Records, MD;  Location: [place];  Service: Cardiovascular;  Laterality: N/A;   TRANSURETHRAL RESECTION OF BLADDER TUMOR N/A 11/05/2016   Procedure: TRANSURETHRAL RESECTION OF BLADDER TUMOR (TURBT);  Surgeon: Ceasar Mons, MD;  Location[facility];  Service: Urology;  Laterality: N/A;    Family History  Problem Relation Age of Onset   Blindness Mother    Cataracts Mother    Diabetes Mother    Glaucoma Mother    Macular degeneration Mother    Cataracts Father    Diabetes Father    Diabetes Sister    Diabetes Brother    Blindness Maternal Uncle    Macular degeneration Maternal Uncle    Macular degeneration Paternal Aunt    Amblyopia Neg Hx    Retinal detachment Neg Hx    Strabismus Neg Hx    Retinitis  pigmentosa Neg Hx     Social History   Tobacco Use   Smoking status: Former    Years: 28.00    Types: Cigarettes    Quit date: 10/22/1999    Years since quitting: 22.4   Smokeless tobacco: Never   Tobacco comments:    Former smoker 11/22/2020  Vaping Use   Vaping Use: Never used  Substance Use Topics   Alcohol use: Yes    Alcohol/week: 5.0 - 6.0 standard drinks of alcohol     Types: 5 - 6 Cans of beer per week    Comment: 5-6 beers in a sitting 05/30/21   Drug use: No    Current Outpatient Medications  Medication Sig Dispense Refill   ELIQUIS 5 MG TABS tablet Take 1 tablet by mouth twice daily 60 tablet 11   lisinopril (PRINIVIL,ZESTRIL) 40 MG tablet Take 40 mg by mouth every evening.      montelukast (SINGULAIR) 10 MG tablet Take 10 mg by mouth daily as needed (allergies).   6   acetaminophen (TYLENOL) 500 MG tablet Take 500 mg by mouth every 6 (six) hours as needed for moderate pain.     diphenhydrAMINE (BENADRYL) 25 mg capsule Take 25 mg by mouth every 6 (six) hours as needed for allergies.     DULoxetine (CYMBALTA) 30 MG capsule Take 30 mg by mouth daily. (Patient not taking: Reported on 03/10/2022)     fluticasone (FLONASE) 50 MCG/ACT nasal spray Place 1 spray into both nostrils as needed for allergies or rhinitis.     Melatonin 5 MG TABS Take 5 mg by mouth at bedtime.     REPATHA SURECLICK [id number] MG/ML SOAJ Inject as directed every 14 (fourteen) days.     sildenafil (VIAGRA) 100 MG tablet Take 100 mg by mouth daily as needed for erectile dysfunction.     tamsulosin (FLOMAX) 0.4 MG CAPS capsule Take 1 capsule (0.4 mg total) by mouth daily. 30 capsule 11   Current Facility-Administered Medications  Medication Dose Route Frequency Provider Last Rate Last Admin   0.9 %  sodium chloride infusion  500 mL Intravenous Once Jackquline Denmark, MD        Allergies  Allergen Reactions   Statins Hives and Rash    whelps,liver enzymes elevated   Iohexol Other (See Comments)    Pt had sneezing after his CT with IV contrast, lasting only 10- 15 minutes, no intervention was needed, per radiologist    Review of Systems:  neg Psychiatric/Behavioral: H/O anxiety or depression     Physical Exam:    BP 124/65   Pulse 61   Temp 98.6 F (37 C)   Ht '5\' 11"'$  (1.803 m)   Wt 192 lb (87.1 kg)   SpO2 99%   BMI 26.78 kg/m  Wt Readings from Last 3 Encounters:  04/07/22  192 lb (87.1 kg)  03/10/22 189 lb (85.7 kg)  01/29/22 192 lb 2 oz (87.1 kg)   Constitutional:  Well-developed, in no acute distress. Psychiatric: Normal mood and affect. Behavior is normal. HEENT: Pupils normal.  Conjunctivae are normal. No scleral icterus. Cardiovascular: Normal rate, regular rhythm. No edema.  Has implantable rhythm monitor. Pulmonary/chest: Effort normal and breath sounds normal. No wheezing, rales or rhonchi. Abdominal: Soft, nondistended. Nontender. Bowel sounds active throughout. There are no masses palpable. No hepatomegaly. Rectal: Deferred Neurological: Alert and oriented to person place and time. Skin: Skin is warm and dry. No rashes noted.  Data Reviewed: I have personally reviewed following labs  and imaging studies  CBC:    Latest Ref Rng & Units 05/30/2021    1:42 PM 05/23/2020   11:30 AM 10/22/2018    9:49 AM  CBC  WBC 4.0 - 10.5 K/uL 5.9  6.0  6.6   Hemoglobin 13.0 - 17.0 g/dL 15.3  16.6  16.3   Hematocrit 39.0 - 52.0 % 46.7  50.1  49.6   Platelets 150 - 400 K/uL 178  206  202     CMP:    Latest Ref Rng & Units 05/30/2021    1:42 PM 05/23/2020   11:30 AM 10/22/2018    9:49 AM  CMP  Glucose 70 - 99 mg/dL 117  91  103   BUN 8 - 23 mg/dL '17  14  18   '$ Creatinine 0.61 - 1.24 mg/dL 0.94  0.96  1.08   Sodium 135 - 145 mmol/L 138  139  136   Potassium 3.5 - 5.1 mmol/L 4.4  4.1  4.5   Chloride 98 - 111 mmol/L 109  106  102   CO2 22 - 32 mmol/L '22  28  25   '$ Calcium 8.9 - 10.3 mg/dL 8.9  9.2  9.7       Radiology Studies: CUP PACEART REMOTE DEVICE CHECK  Result Date: 03/16/2022 ILR summary report received. Battery status OK. Normal device function. No new symptom, tachy, brady, or pause episodes. No new AF episodes.  AF burden is 0% of the time.  Monthly summary reports and ROV/PRN Kathy Breach, RN, CCDS, [employer]  CUP PACEART INCLINIC DEVICE CHECK  Result Date: 03/10/2022 Loop check in clinic. Battery status: good__. R-waves _0.34_mV. 0  symptom episodes, 0 tachy episodes, 0 pause episodes, 0 brady episodes. 0 AF episodes (0% burden). Monthly summary reports and ROV with _EP service in a year again, sooner if needed__.     Carmell Austria, MD 04/07/2022, 10:46 AM  Cc: Reynold Bowen, MD

## 2022-04-07 NOTE — Op Note (Signed)
[facility] Patient Name: Haruto Ries Procedure Date: 04/07/2022 10:55 AM MRN: [id number] Endoscopist: Jackquline Denmark , MD, [id number] Age: 72 Referring MD:  Date of Birth: [birth year] Gender: Male Account #: [id number] Procedure:                Colonoscopy Indications:              High risk colon cancer surveillance: Personal                            history of colonic polyps Medicines:                Monitored Anesthesia Care Procedure:                Pre-Anesthesia Assessment:                           - Prior to the procedure, a History and Physical                            was performed, and patient medications and                            allergies were reviewed. The patient's tolerance of                            previous anesthesia was also reviewed. The risks                            and benefits of the procedure and the sedation                            options and risks were discussed with the patient.                            All questions were answered, and informed consent                            was obtained. Prior Anticoagulants: The patient has                            taken Eliquis (apixaban), last dose was 1 day prior                            to procedure. ASA Grade Assessment: III - A patient                            with severe systemic disease. After reviewing the                            risks and benefits, the patient was deemed in                            satisfactory condition to undergo the procedure.  After obtaining informed consent, the colonoscope                            was passed under direct vision. Throughout the                            procedure, the patient's blood pressure, pulse, and                            oxygen saturations were monitored continuously. The                            Olympus CF-HQ190L [id number]) Colonoscope was                            introduced through  the anus and advanced to the the                            cecum, identified by appendiceal orifice and                            ileocecal valve. The colonoscopy was performed                            without difficulty. The patient tolerated the                            procedure well. The quality of the bowel                            preparation was good. The ileocecal valve,                            appendiceal orifice, and rectum were photographed. Scope In: 11:03:11 AM Scope Out: [id number] AM Scope Withdrawal Time: 0 hours 11 minutes 27 seconds  Total Procedure Duration: 0 hours 13 minutes 37 seconds  Findings:                 Three sessile polyps were found in the proximal                            transverse colon and mid ascending colon. The                            polyps were 4 to 6 mm in size. These polyps were                            removed with a cold snare. Resection and retrieval                            were complete.                           A few small-mouthed diverticula were found in the  sigmoid colon.                           Non-bleeding internal hemorrhoids were found during                            retroflexion. The hemorrhoids were small and Grade                            I (internal hemorrhoids that do not prolapse).                           The exam was otherwise without abnormality on                            direct and retroflexion views. Complications:            No immediate complications. Estimated Blood Loss:     Estimated blood loss: none. Impression:               - Three 4 to 6 mm polyps in the proximal transverse                            colon and in the mid ascending colon, removed with                            a cold snare. Resected and retrieved.                           - Mild sigmoid diverticulosis.                           - Non-bleeding internal hemorrhoids.                            - The examination was otherwise normal on direct                            and retroflexion views. Recommendation:           - Patient has a contact number available for                            emergencies. The signs and symptoms of potential                            delayed complications were discussed with the                            patient. Return to normal activities tomorrow.                            Written discharge instructions were provided to the                            patient.                           -  High fiber diet.                           - Continue present medications.                           - Await pathology results.                           - Resume Eliquis (apixaban) at 2/28                           - Repeat colonoscopy for surveillance based on                            pathology results.                           - The findings and recommendations were discussed                            with the patient's family. Jackquline Denmark, MD 04/07/2022 11:21:44 AM This report has been signed electronically.

## 2022-04-07 NOTE — Progress Notes (Signed)
Pt resting comfortably. VSS. Airway intact. SBAR complete to RN. All questions answered.   

## 2022-04-07 NOTE — Progress Notes (Signed)
Called to room to assist during endoscopic procedure.  Patient ID and intended procedure confirmed with present staff. Received instructions for my participation in the procedure from the performing physician.  

## 2022-04-08 ENCOUNTER — Telehealth: Payer: Self-pay | Admitting: *Deleted

## 2022-04-08 NOTE — Telephone Encounter (Signed)
Follow up call attempt.  No answer or ability to leave a message.

## 2022-04-10 ENCOUNTER — Ambulatory Visit (HOSPITAL_COMMUNITY)
Admission: RE | Admit: 2022-04-10 | Discharge: 2022-04-10 | Disposition: A | Discharge status: Home / Self Care | Payer: Medicare Other | Source: Ambulatory Visit | Attending: Vascular Surgery | Admitting: Vascular Surgery

## 2022-04-10 ENCOUNTER — Other Ambulatory Visit: Payer: Self-pay | Admitting: *Deleted

## 2022-04-10 ENCOUNTER — Ambulatory Visit: Payer: Medicare Other | Admitting: Physician Assistant

## 2022-04-10 VITALS — BP 137/74 | HR 77 | Temp 97.5°F | Resp 20 | Ht 71.0 in | Wt 187.6 lb

## 2022-04-10 DIAGNOSIS — I6529 Occlusion and stenosis of unspecified carotid artery: Secondary | ICD-10-CM

## 2022-04-10 NOTE — Progress Notes (Signed)
Office Note     CC:  follow up Requesting Provider:  Reynold Bowen, MD  HPI: Shaun Ball is a 72 y.o. (13-May-1950) male who presents for surveillance of carotid artery stenosis.  He was seen in the office in January 2023.  Since that time he has not been diagnosed with a CVA or TIA.  He also denies any strokelike symptoms including slurring speech, changes in vision, or one-sided weakness.  He does not have any drop attacks or arm claudication.  He follows regularly with his PCP every 6 months.  He is excited about his results from using [facility].  He has a statin allergy.  He is on Eliquis for atrial flutter which is prescribed by his cardiologist.  He also denies tobacco use.   Past Medical History:  Diagnosis Date   Arthritis    Bladder cancer ([place])    CVA (cerebral vascular accident) ([place]) 09/2018   History of kidney stones 2012   passed / has renal cyst to evaluated at later time   Hypertension    Wears glasses     Past Surgical History:  Procedure Laterality Date   BUBBLE STUDY  10/01/2018   Procedure: BUBBLE STUDY;  Surgeon: Fay Records, MD;  Location: [facility] ENDOSCOPY;  Service: Cardiovascular;;   COLONOSCOPY  10/25/2007   [facility]. Dr Christian Mate. Polyp(s) of the right colon Minimal Hemorrhoidal disease.   CYSTOSCOPY N/A 11/05/2016   Procedure: CYSTOSCOPY;  Surgeon: Ceasar Mons, MD;  Location: [facility];  Service: Urology;  Laterality: N/A;   CYSTOSCOPY WITH BIOPSY Bilateral 10/24/2016   Procedure: CYSTOSCOPY WITH BLADDER BIOPSY/ BILATERAL RETROGRADE [id number];  Surgeon: Ceasar Mons, MD;  Location: [facility];  Service: Urology;  Laterality: Bilateral;   CYSTOSCOPY WITH BIOPSY N/A 05/12/2018   Procedure: CYSTOSCOPY WITH BLADDER BIOPSY/ BILATERAL RETROGRADE PYELOGRAM BIOPSY;  Surgeon: Ceasar Mons, MD;  Location: [facility];  Service: Urology;  Laterality: N/A;   EXCISION HAGLUND'S  DEFORMITY WITH ACHILLES TENDON REPAIR Bilateral left 03-25-2006;  right 07-07-2006   and Gastroc Slide   fattytumorremoved from back      LOOP RECORDER INSERTION N/A 10/01/2018   Procedure: LOOP RECORDER INSERTION;  Surgeon: Constance Haw, MD;  Location: [facility];  Service: Cardiovascular;  Laterality: N/A;   TEE WITHOUT CARDIOVERSION N/A 10/01/2018   Procedure: TRANSESOPHAGEAL ECHOCARDIOGRAM (TEE);  Surgeon: Fay Records, MD;  Location: [facility];  Service: Cardiovascular;  Laterality: N/A;   TRANSURETHRAL RESECTION OF BLADDER TUMOR N/A 11/05/2016   Procedure: TRANSURETHRAL RESECTION OF BLADDER TUMOR (TURBT);  Surgeon: Ceasar Mons, MD;  Location: [facility];  Service: Urology;  Laterality: N/A;    Social History   Socioeconomic History   Marital status: Legally Separated    Spouse name: Not on file   Number of children: Not on file   Years of education: Not on file   Highest education level: Not on file  Occupational History   Occupation: retired  Tobacco Use   Smoking status: Former    Years: 28.00    Types: Cigarettes    Quit date: 10/22/1999    Years since quitting: 22.4    Passive exposure: Never   Smokeless tobacco: Never   Tobacco comments:    Former smoker 11/22/2020  Vaping Use   Vaping Use: Never used  Substance and Sexual Activity   Alcohol use: Yes    Alcohol/week: 5.0 - 6.0 standard drinks of alcohol  Types: 5 - 6 Cans of beer per week    Comment: 5-6 beers in a sitting 05/30/21   Drug use: No   Sexual activity: Not on file  Other Topics Concern   Not on file  Social History Narrative   Mr Shaun Ball a 72 year old retired legally separated male He lives at home and has the assistance of his son and Shaun Ball    Social Determinants of Health   Financial Resource Strain: Low Risk  (10/06/2018)   Overall Financial Resource Strain (CARDIA)    Difficulty of Paying Living Expenses: Not hard at all  Food  Insecurity: No Food Insecurity (10/06/2018)   Hunger Vital Sign    Worried About Running Out of Food in the Last Year: Never true    [place] in the Last Year: Never true  Transportation Needs: No Transportation Needs (10/06/2018)   PRAPARE - Hydrologist (Medical): No    Lack of Transportation (Non-Medical): No  Physical Activity: Inactive (10/06/2018)   Exercise Vital Sign    Days of Exercise per Week: 0 days    Minutes of Exercise per Session: 0 min  Stress: No Stress Concern Present (10/06/2018)   [place]    Feeling of Stress : Not at all  Social Connections: Not on file  Intimate Partner Violence: Not on file    Family History  Problem Relation Age of Onset   Blindness Mother    Cataracts Mother    Diabetes Mother    Glaucoma Mother    Macular degeneration Mother    Cataracts Father    Diabetes Father    Diabetes Sister    Diabetes Brother    Blindness Maternal Uncle    Macular degeneration Maternal Uncle    Macular degeneration Paternal Aunt    Amblyopia Neg Hx    Retinal detachment Neg Hx    Strabismus Neg Hx    Retinitis pigmentosa Neg Hx     Current Outpatient Medications  Medication Sig Dispense Refill   acetaminophen (TYLENOL) 500 MG tablet Take 500 mg by mouth every 6 (six) hours as needed for moderate pain.     diphenhydrAMINE (BENADRYL) 25 mg capsule Take 25 mg by mouth every 6 (six) hours as needed for allergies.     ELIQUIS 5 MG TABS tablet Take 1 tablet by mouth twice daily 60 tablet 11   fluticasone (FLONASE) 50 MCG/ACT nasal spray Place 1 spray into both nostrils as needed for allergies or rhinitis.     lisinopril (PRINIVIL,ZESTRIL) 40 MG tablet Take 40 mg by mouth every evening.      Melatonin 5 MG TABS Take 5 mg by mouth at bedtime.     montelukast (SINGULAIR) 10 MG tablet Take 10 mg by mouth daily as needed (allergies).   6   REPATHA SURECLICK [id number]  MG/ML SOAJ Inject as directed every 14 (fourteen) days.     sildenafil (VIAGRA) 100 MG tablet Take 100 mg by mouth daily as needed for erectile dysfunction.     tamsulosin (FLOMAX) 0.4 MG CAPS capsule Take 1 capsule (0.4 mg total) by mouth daily. 30 capsule 11   DULoxetine (CYMBALTA) 30 MG capsule Take 30 mg by mouth daily. (Patient not taking: Reported on 03/10/2022)     No current facility-administered medications for this visit.    Allergies  Allergen Reactions   Statins Hives and Rash    whelps,liver enzymes  elevated   Iohexol Other (See Comments)    Pt had sneezing after his CT with IV contrast, lasting only 10- 15 minutes, no intervention was needed, per radiologist     REVIEW OF SYSTEMS:   '[X]'$  denotes positive finding, '[ ]'$  denotes negative finding Cardiac  Comments:  Chest pain or chest pressure:    Shortness of breath upon exertion:    Short of breath when lying flat:    Irregular heart rhythm:        Vascular    Pain in calf, thigh, or hip brought on by ambulation:    Pain in feet at night that wakes you up from your sleep:     Blood clot in your veins:    Leg swelling:         Pulmonary    Oxygen at home:    Productive cough:     Wheezing:         Neurologic    Sudden weakness in arms or legs:     Sudden numbness in arms or legs:     Sudden onset of difficulty speaking or slurred speech:    Temporary loss of vision in one eye:     Problems with dizziness:         Gastrointestinal    Blood in stool:     Vomited blood:         Genitourinary    Burning when urinating:     Blood in urine:        Psychiatric    Major depression:         Hematologic    Bleeding problems:    Problems with blood clotting too easily:        Skin    Rashes or ulcers:        Constitutional    Fever or chills:      PHYSICAL EXAMINATION:  Vitals:   04/10/22 0854 04/10/22 0856  BP: 127/71 137/74  Pulse: 77   Resp: 20   Temp: (!) 97.5 F (36.4 C)   TempSrc:  Temporal   SpO2: 99%   Weight: 187 lb 9.6 oz (85.1 kg)   Height: '5\' 11"'$  (1.803 m)     General:  WDWN in NAD; vital signs documented above Gait: Not observed HENT: WNL, normocephalic Pulmonary: normal non-labored breathing , without Rales, rhonchi,  wheezing Cardiac: regular HR Abdomen: soft, NT, no masses Skin: without rashes Vascular Exam/Pulses:  Right Left  Radial 2+ (normal) 2+ (normal)   Extremities: without ischemic changes, without Gangrene , without cellulitis; without open wounds;  Musculoskeletal: no muscle wasting or atrophy  Neurologic: A&O X 3; CN grossly intact Psychiatric:  The pt has Normal affect.   Non-Invasive Vascular Imaging:   Right ICA 1 to 39% Left ICA 1 to 39% Antegrade vertebral arteries bilaterally Subclavian arteries are within normal limits bilaterally    ASSESSMENT/PLAN:: 72 y.o. male here for follow up for surveillance of carotid artery stenosis  -Subjectively, the patient has not had any neuro events since last office visit -Carotid duplex is stable.  He has 1 to 39% stenosis of bilateral internal carotid arteries. -He will continue regular follow-up with his PCP for management of chronic medical conditions including hyperlipidemia -We will recheck his carotid duplex in 1 year   Dagoberto Ligas, PA-C [facility] [phone]  Clinic MD:   Scot Dock

## 2022-04-13 ENCOUNTER — Encounter: Payer: Self-pay | Admitting: Gastroenterology

## 2022-04-21 ENCOUNTER — Ambulatory Visit: Payer: Medicare Other

## 2022-04-21 DIAGNOSIS — I63411 Cerebral infarction due to embolism of right middle cerebral artery: Secondary | ICD-10-CM

## 2022-04-22 LAB — CUP PACEART REMOTE DEVICE CHECK
Date Time Interrogation Session: 20240310230738
Implantable Pulse Generator Implant Date: 20200821

## 2022-04-30 NOTE — Progress Notes (Signed)
Carelink Summary Report / Loop Recorder 

## 2022-05-04 ENCOUNTER — Other Ambulatory Visit: Payer: Self-pay

## 2022-05-04 ENCOUNTER — Emergency Department (HOSPITAL_BASED_OUTPATIENT_CLINIC_OR_DEPARTMENT_OTHER): Payer: Medicare Other | Admitting: Certified Registered Nurse Anesthetist

## 2022-05-04 ENCOUNTER — Encounter (HOSPITAL_COMMUNITY)
Admission: EM | Disposition: A | Discharge status: Home / Self Care | Payer: Self-pay | Source: Home / Self Care | Attending: Emergency Medicine

## 2022-05-04 ENCOUNTER — Emergency Department (HOSPITAL_COMMUNITY): Payer: Medicare Other

## 2022-05-04 ENCOUNTER — Encounter (HOSPITAL_COMMUNITY): Payer: Self-pay

## 2022-05-04 ENCOUNTER — Ambulatory Visit (HOSPITAL_COMMUNITY)
Admission: EM | Admit: 2022-05-04 | Discharge: 2022-05-04 | Disposition: A | Discharge status: Home / Self Care | Payer: Medicare Other | Attending: Emergency Medicine | Admitting: Emergency Medicine

## 2022-05-04 ENCOUNTER — Emergency Department (HOSPITAL_COMMUNITY): Payer: Medicare Other | Admitting: Certified Registered Nurse Anesthetist

## 2022-05-04 DIAGNOSIS — T18128A Food in esophagus causing other injury, initial encounter: Secondary | ICD-10-CM | POA: Diagnosis not present

## 2022-05-04 DIAGNOSIS — K2970 Gastritis, unspecified, without bleeding: Secondary | ICD-10-CM | POA: Insufficient documentation

## 2022-05-04 DIAGNOSIS — Z8551 Personal history of malignant neoplasm of bladder: Secondary | ICD-10-CM | POA: Insufficient documentation

## 2022-05-04 DIAGNOSIS — D7218 Eosinophilia in diseases classified elsewhere: Secondary | ICD-10-CM | POA: Insufficient documentation

## 2022-05-04 DIAGNOSIS — B9681 Helicobacter pylori [H. pylori] as the cause of diseases classified elsewhere: Secondary | ICD-10-CM | POA: Diagnosis not present

## 2022-05-04 DIAGNOSIS — W44F3XA Food entering into or through a natural orifice, initial encounter: Secondary | ICD-10-CM

## 2022-05-04 DIAGNOSIS — Z8673 Personal history of transient ischemic attack (TIA), and cerebral infarction without residual deficits: Secondary | ICD-10-CM | POA: Diagnosis not present

## 2022-05-04 DIAGNOSIS — R1310 Dysphagia, unspecified: Secondary | ICD-10-CM

## 2022-05-04 DIAGNOSIS — Z87891 Personal history of nicotine dependence: Secondary | ICD-10-CM | POA: Diagnosis not present

## 2022-05-04 DIAGNOSIS — Z7901 Long term (current) use of anticoagulants: Secondary | ICD-10-CM | POA: Insufficient documentation

## 2022-05-04 DIAGNOSIS — K2950 Unspecified chronic gastritis without bleeding: Secondary | ICD-10-CM

## 2022-05-04 DIAGNOSIS — K2210 Ulcer of esophagus without bleeding: Secondary | ICD-10-CM | POA: Diagnosis not present

## 2022-05-04 DIAGNOSIS — K208 Other esophagitis without bleeding: Secondary | ICD-10-CM | POA: Insufficient documentation

## 2022-05-04 DIAGNOSIS — K449 Diaphragmatic hernia without obstruction or gangrene: Secondary | ICD-10-CM | POA: Insufficient documentation

## 2022-05-04 DIAGNOSIS — K2289 Other specified disease of esophagus: Secondary | ICD-10-CM | POA: Diagnosis not present

## 2022-05-04 DIAGNOSIS — R1319 Other dysphagia: Secondary | ICD-10-CM

## 2022-05-04 DIAGNOSIS — K3189 Other diseases of stomach and duodenum: Secondary | ICD-10-CM | POA: Diagnosis not present

## 2022-05-04 DIAGNOSIS — M1990 Unspecified osteoarthritis, unspecified site: Secondary | ICD-10-CM | POA: Diagnosis not present

## 2022-05-04 DIAGNOSIS — I10 Essential (primary) hypertension: Secondary | ICD-10-CM | POA: Insufficient documentation

## 2022-05-04 DIAGNOSIS — Q402 Other specified congenital malformations of stomach: Secondary | ICD-10-CM

## 2022-05-04 HISTORY — PX: ESOPHAGOGASTRODUODENOSCOPY: SHX5428

## 2022-05-04 HISTORY — PX: BIOPSY: SHX5522

## 2022-05-04 HISTORY — PX: FOREIGN BODY REMOVAL: SHX962

## 2022-05-04 LAB — CBC WITH DIFFERENTIAL/PLATELET
Abs Immature Granulocytes: 0.02 10*3/uL (ref 0.00–0.07)
Basophils Absolute: 0.1 10*3/uL (ref 0.0–0.1)
Basophils Relative: 1 %
Eosinophils Absolute: 0.1 10*3/uL (ref 0.0–0.5)
Eosinophils Relative: 1 %
HCT: 47.3 % (ref 39.0–52.0)
Hemoglobin: 15.9 g/dL (ref 13.0–17.0)
Immature Granulocytes: 0 %
Lymphocytes Relative: 28 %
Lymphs Abs: 2.4 10*3/uL (ref 0.7–4.0)
MCH: 30.3 pg (ref 26.0–34.0)
MCHC: 33.6 g/dL (ref 30.0–36.0)
MCV: 90.3 fL (ref 80.0–100.0)
Monocytes Absolute: 0.9 10*3/uL (ref 0.1–1.0)
Monocytes Relative: 10 %
Neutro Abs: 5 10*3/uL (ref 1.7–7.7)
Neutrophils Relative %: 60 %
Platelets: 179 10*3/uL (ref 150–400)
RBC: 5.24 MIL/uL (ref 4.22–5.81)
RDW: 12.3 % (ref 11.5–15.5)
WBC: 8.4 10*3/uL (ref 4.0–10.5)
nRBC: 0 % (ref 0.0–0.2)

## 2022-05-04 LAB — BASIC METABOLIC PANEL
Anion gap: 11 (ref 5–15)
BUN: 14 mg/dL (ref 8–23)
CO2: 21 mmol/L — ABNORMAL LOW (ref 22–32)
Calcium: 9.1 mg/dL (ref 8.9–10.3)
Chloride: 105 mmol/L (ref 98–111)
Creatinine, Ser: 0.97 mg/dL (ref 0.61–1.24)
GFR, Estimated: >60 mL/min (ref >60)
Glucose, Bld: 132 mg/dL — ABNORMAL HIGH (ref 70–99)
Potassium: 3.7 mmol/L (ref 3.5–5.1)
Sodium: 137 mmol/L (ref 135–145)

## 2022-05-04 SURGERY — EGD (ESOPHAGOGASTRODUODENOSCOPY)
Anesthesia: Monitor Anesthesia Care

## 2022-05-04 SURGERY — EGD (ESOPHAGOGASTRODUODENOSCOPY)
Anesthesia: General

## 2022-05-04 MED ORDER — PROPOFOL 10 MG/ML IV BOLUS
INTRAVENOUS | Status: AC
  Filled 2022-05-04: qty 20

## 2022-05-04 MED ORDER — SUCRALFATE 1 GM/10ML PO SUSP: 1.0000 g | Freq: Two times a day (BID) | ORAL | 1 refills | Status: DC

## 2022-05-04 MED ORDER — LIDOCAINE 2% (20 MG/ML) 5 ML SYRINGE
INTRAMUSCULAR | Status: DC | PRN
  Administered 2022-05-04: 60 mg via INTRAVENOUS

## 2022-05-04 MED ORDER — ONDANSETRON HCL 4 MG/2ML IJ SOLN
INTRAMUSCULAR | Status: DC | PRN
  Administered 2022-05-04: 4 mg via INTRAVENOUS

## 2022-05-04 MED ORDER — GLUCAGON HCL RDNA (DIAGNOSTIC) 1 MG IJ SOLR
1.0000 mg | Freq: Once | INTRAMUSCULAR | Status: AC
  Administered 2022-05-04: 1 mg via INTRAVENOUS
  Filled 2022-05-04: qty 1

## 2022-05-04 MED ORDER — APIXABAN 5 MG PO TABS: 5.0000 mg | ORAL_TABLET | Freq: Two times a day (BID) | ORAL | 11 refills | Status: AC

## 2022-05-04 MED ORDER — SUCCINYLCHOLINE CHLORIDE 200 MG/10ML IV SOSY
PREFILLED_SYRINGE | INTRAVENOUS | Status: DC | PRN
  Administered 2022-05-04: 120 mg via INTRAVENOUS

## 2022-05-04 MED ORDER — DEXAMETHASONE SODIUM PHOSPHATE 10 MG/ML IJ SOLN
INTRAMUSCULAR | Status: DC | PRN
  Administered 2022-05-04: 8 mg via INTRAVENOUS

## 2022-05-04 MED ORDER — PROPOFOL 10 MG/ML IV BOLUS
INTRAVENOUS | Status: DC | PRN
  Administered 2022-05-04: 200 mg via INTRAVENOUS

## 2022-05-04 MED ORDER — SUGAMMADEX SODIUM 200 MG/2ML IV SOLN
INTRAVENOUS | Status: DC | PRN
  Administered 2022-05-04: 200 mg via INTRAVENOUS

## 2022-05-04 MED ORDER — ROCURONIUM BROMIDE 10 MG/ML (PF) SYRINGE
PREFILLED_SYRINGE | INTRAVENOUS | Status: DC | PRN
  Administered 2022-05-04: 30 mg via INTRAVENOUS

## 2022-05-04 MED ORDER — NITROGLYCERIN 0.4 MG SL SUBL
0.4000 mg | SUBLINGUAL_TABLET | SUBLINGUAL | Status: DC | PRN
  Administered 2022-05-04: 0.4 mg via SUBLINGUAL
  Filled 2022-05-04: qty 1

## 2022-05-04 MED ORDER — FENTANYL CITRATE (PF) 100 MCG/2ML IJ SOLN
INTRAMUSCULAR | Status: DC | PRN
  Administered 2022-05-04: 100 ug via INTRAVENOUS

## 2022-05-04 MED ORDER — LACTATED RINGERS IV SOLN: INTRAVENOUS | Status: DC

## 2022-05-04 MED ORDER — PANTOPRAZOLE SODIUM 40 MG PO TBEC: 40.0000 mg | DELAYED_RELEASE_TABLET | Freq: Two times a day (BID) | ORAL | 12 refills | Status: DC

## 2022-05-04 MED ORDER — FENTANYL CITRATE (PF) 100 MCG/2ML IJ SOLN
INTRAMUSCULAR | Status: AC
  Filled 2022-05-04: qty 2

## 2022-05-04 NOTE — ED Notes (Signed)
Pt. In ENDO.

## 2022-05-04 NOTE — Anesthesia Preprocedure Evaluation (Signed)
Anesthesia Evaluation  Patient identified by MRN, date of birth, ID band Patient awake    Reviewed: Allergy & Precautions, H&P , NPO status , Patient's Chart, lab work & pertinent test results  Airway Mallampati: II   Neck ROM: full    Dental   Pulmonary former smoker   breath sounds clear to auscultation       Cardiovascular hypertension,  Rhythm:regular Rate:Normal     Neuro/Psych CVA    GI/Hepatic   Endo/Other    Renal/GU      Musculoskeletal  (+) Arthritis ,    Abdominal   Peds  Hematology   Anesthesia Other Findings   Reproductive/Obstetrics                             Anesthesia Physical Anesthesia Plan  ASA: 3 and emergent  Anesthesia Plan: General   Post-op Pain Management:    Induction: Intravenous, Rapid sequence and Cricoid pressure planned  PONV Risk Score and Plan: 2 and Ondansetron, Dexamethasone, Midazolam and Treatment may vary due to age or medical condition  Airway Management Planned: Oral ETT  Additional Equipment:   Intra-op Plan:   Post-operative Plan: Extubation in OR  Informed Consent: I have reviewed the patients History and Physical, chart, labs and discussed the procedure including the risks, benefits and alternatives for the proposed anesthesia with the patient or authorized representative who has indicated his/her understanding and acceptance.     Dental advisory given  Plan Discussed with: CRNA, Anesthesiologist and Surgeon  Anesthesia Plan Comments:        Anesthesia Quick Evaluation

## 2022-05-04 NOTE — H&P (Signed)
GASTROENTEROLOGY PROCEDURE H&P NOTE   Primary Care Physician: Reynold Bowen, MD  HPI: Shaun Ball is a 72 y.o. male who presents for EGD for evaluation of dysphagia and concern for acute food impaction.  Past Medical History:  Diagnosis Date   Arthritis    Bladder cancer ([place])    CVA (cerebral vascular accident) ([place]) 09/2018   History of kidney stones 2012   passed / has renal cyst to evaluated at later time   Hypertension    Wears glasses    Past Surgical History:  Procedure Laterality Date   BUBBLE STUDY  10/01/2018   Procedure: BUBBLE STUDY;  Surgeon: Fay Records, MD;  Location: [facility] ENDOSCOPY;  Service: Cardiovascular;;   COLONOSCOPY  10/25/2007   [facility]. Dr Christian Mate. Polyp(s) of the right colon Minimal Hemorrhoidal disease.   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 left 03-25-2006;  right 07-07-2006   and Gastroc Slide   fattytumorremoved from back      LOOP RECORDER INSERTION N/A 10/01/2018   Procedure: LOOP RECORDER INSERTION;  Surgeon: Constance Haw, MD;  Location: [facility];  Service: Cardiovascular;  Laterality: N/A;   TEE WITHOUT CARDIOVERSION N/A 10/01/2018   Procedure: TRANSESOPHAGEAL ECHOCARDIOGRAM (TEE);  Surgeon: Fay Records, MD;  Location: [facility];   Service: Cardiovascular;  Laterality: N/A;   TRANSURETHRAL RESECTION OF BLADDER TUMOR N/A 11/05/2016   Procedure: TRANSURETHRAL RESECTION OF BLADDER TUMOR (TURBT);  Surgeon: Ceasar Mons, MD;  Location: [facility];  Service: Urology;  Laterality: N/A;   Current Facility-Administered Medications  Medication Dose Route Frequency Provider Last Rate Last Admin   lactated ringers infusion   Intravenous Continuous Lacretia Leigh, MD 125 mL/hr at 05/04/22 1740 New Bag at 05/04/22 1740   nitroGLYCERIN (NITROSTAT) SL tablet 0.4 mg  0.4 mg Sublingual Q5 min PRN Lacretia Leigh, MD   0.4 mg at 05/04/22 1814   Current Outpatient Medications  Medication Sig Dispense Refill   acetaminophen (TYLENOL) 500 MG tablet Take 500 mg by mouth every 6 (six) hours as needed for moderate pain.     diphenhydrAMINE (BENADRYL) 25 mg capsule Take 25 mg by mouth every 6 (six) hours as needed for allergies.     DULoxetine (CYMBALTA) 30 MG capsule Take 30 mg by mouth daily. (Patient not taking: Reported on 03/10/2022)     ELIQUIS 5 MG TABS tablet Take 1 tablet by mouth twice daily 60 tablet 11   fluticasone (FLONASE) 50 MCG/ACT nasal spray Place 1 spray into both nostrils as needed for allergies or rhinitis.     lisinopril (PRINIVIL,ZESTRIL) 40 MG tablet Take 40 mg by mouth every evening.      Melatonin 5 MG TABS Take 5 mg by mouth at bedtime.     montelukast (SINGULAIR) 10 MG tablet Take 10 mg by mouth daily as needed (allergies).   [place] [id number] MG/ML  SOAJ Inject as directed every 14 (fourteen) days.     sildenafil (VIAGRA) 100 MG tablet Take 100 mg by mouth daily as needed for erectile dysfunction.     tamsulosin (FLOMAX) 0.4 MG CAPS capsule Take 1 capsule (0.4 mg total) by mouth daily. 30 capsule 11    Current Facility-Administered Medications:    lactated ringers infusion, , Intravenous, Continuous, Lacretia Leigh, MD, Last Rate: 125 mL/hr at 05/04/22 1740, New Bag at 05/04/22  1740   nitroGLYCERIN (NITROSTAT) SL tablet 0.4 mg, 0.4 mg, Sublingual, Q5 min PRN, Lacretia Leigh, MD, 0.4 mg at 05/04/22 1814  Current Outpatient Medications:    acetaminophen (TYLENOL) 500 MG tablet, Take 500 mg by mouth every 6 (six) hours as needed for moderate pain., Disp: , Rfl:    diphenhydrAMINE (BENADRYL) 25 mg capsule, Take 25 mg by mouth every 6 (six) hours as needed for allergies., Disp: , Rfl:    DULoxetine (CYMBALTA) 30 MG capsule, Take 30 mg by mouth daily. (Patient not taking: Reported on 03/10/2022), Disp: , Rfl:    ELIQUIS 5 MG TABS tablet, Take 1 tablet by mouth twice daily, Disp: 60 tablet, Rfl: 11   fluticasone (FLONASE) 50 MCG/ACT nasal spray, Place 1 spray into both nostrils as needed for allergies or rhinitis., Disp: , Rfl:    lisinopril (PRINIVIL,ZESTRIL) 40 MG tablet, Take 40 mg by mouth every evening. , Disp: , Rfl:    Melatonin 5 MG TABS, Take 5 mg by mouth at bedtime., Disp: , Rfl:    montelukast (SINGULAIR) 10 MG tablet, Take 10 mg by mouth daily as needed (allergies). , Disp: , Rfl: 6   REPATHA SURECLICK [id number] MG/ML SOAJ, Inject as directed every 14 (fourteen) days., Disp: , Rfl:    sildenafil (VIAGRA) 100 MG tablet, Take 100 mg by mouth daily as needed for erectile dysfunction., Disp: , Rfl:    tamsulosin (FLOMAX) 0.4 MG CAPS capsule, Take 1 capsule (0.4 mg total) by mouth daily., Disp: 30 capsule, Rfl: 11 Allergies  Allergen Reactions   Statins Hives and Rash    whelps,liver enzymes elevated   Iohexol Other (See Comments)    Pt had sneezing after his CT with IV contrast, lasting only 10- 15 minutes, no intervention was needed, per radiologist   Family History  Problem Relation Age of Onset   Blindness Mother    Cataracts Mother    Diabetes Mother    Glaucoma Mother    Macular degeneration Mother    Cataracts Father    Diabetes Father    Diabetes Sister    Diabetes Brother    Blindness Maternal Uncle    Macular degeneration Maternal Uncle    Macular  degeneration Paternal Aunt    Amblyopia Neg Hx    Retinal detachment Neg Hx    Strabismus Neg Hx    Retinitis pigmentosa Neg Hx    Social History   Socioeconomic History   Marital status: Legally Separated    Spouse name: Not on file   Number of children: Not on file   Years of education: Not on file   Highest education level: Not on file  Occupational History   Occupation: retired  Tobacco Use   Smoking status: Former    Years: 28    Types: Cigarettes    Quit date: 10/22/1999    Years since quitting: 22.5    Passive exposure: Never   Smokeless tobacco: Never   Tobacco comments:    Former smoker 11/22/2020  Vaping Use  Vaping Use: Never used  Substance and Sexual Activity   Alcohol use: Yes    Alcohol/week: 5.0 - 6.0 standard drinks of alcohol    Types: 5 - 6 Cans of beer per week    Comment: 5-6 beers in a sitting 05/30/21   Drug use: No   Sexual activity: Not on file  Other Topics Concern   Not on file  Social History Narrative   [name] a 72 year old retired legally separated male He lives at home and has the assistance of his son and Margreta Journey    Social Determinants of Health   Financial Resource Strain: Low Risk  (10/06/2018)   Overall Financial Resource Strain (CARDIA)    Difficulty of Paying Living Expenses: Not hard at all  Food Insecurity: No Food Insecurity (10/06/2018)   Hunger Vital Sign    Worried About Running Out of Food in the Last Year: Never true    [place] in the Last Year: Never true  Transportation Needs: No Transportation Needs (10/06/2018)   PRAPARE - Hydrologist (Medical): No    Lack of Transportation (Non-Medical): No  Physical Activity: Inactive (10/06/2018)   Exercise Vital Sign    Days of Exercise per Week: 0 days    Minutes of Exercise per Session: 0 min  Stress: No Stress Concern Present (10/06/2018)   [place]     Feeling of Stress : Not at all  Social Connections: Not on file  Intimate Partner Violence: Not on file    Physical Exam: Today's Vitals   05/04/22 1624 05/04/22 1626 05/04/22 1816 05/04/22 1835  BP: (!) 151/69  135/78 (!) 140/77  Pulse: 80  73 67  Resp: (!) 22  18 18   Temp: 98.2 F (36.8 C)     TempSrc: Oral     SpO2: 100%  100% 100%  Weight:  84.8 kg    Height:  5\' 11"  (1.803 m)    PainSc:  2  2     Body mass index is 26.08 kg/m. GEN: NAD EYE: Sclerae anicteric ENT: MMM CV: Non-tachycardic GI: Soft, NT/ND NEURO:  Alert & Oriented x 3  Lab Results: Recent Labs    05/04/22 1827  WBC 8.4  HGB 15.9  HCT 47.3  PLT 179   BMET Recent Labs    05/04/22 1827  NA 137  K 3.7  CL 105  CO2 21*  GLUCOSE 132*  BUN 14  CREATININE 0.97  CALCIUM 9.1   LFT No results for input(s): "PROT", "ALBUMIN", "AST", "ALT", "ALKPHOS", "BILITOT", "BILIDIR", "IBILI" in the last 72 hours. PT/INR No results for input(s): "LABPROT", "INR" in the last 72 hours.   Impression / Plan: This is a 72 y.o.male who presents for EGD for evaluation of dysphagia and concern for acute food impaction.  The risks and benefits of endoscopic evaluation/treatment were discussed with the patient and/or family; these include but are not limited to the risk of perforation, infection, bleeding, missed lesions, lack of diagnosis, severe illness requiring hospitalization, as well as anesthesia and sedation related illnesses.  The patient's history has been reviewed, patient examined, no change in status, and deemed stable for procedure.  The patient and/or family is agreeable to proceed.    Justice Britain, MD [facility] Office # [phone]

## 2022-05-04 NOTE — Consult Note (Signed)
Gastroenterology Inpatient Consultation   Attending Requesting Consult No att. providers found  [facility] Day: 1  Reason for Consult Acute dysphagia and food impaction    History of Present Illness  Shaun Ball is a 72 y.o. male with a pmh significant for prior CVA (on Eliquis), nephrolithiasis, hypertension, bladder cancer, colon polyps.  The GI service is consulted for evaluation and management of acute food impaction and dysphagia.  The patient has been in his normal state of health.  Over the course the last 2 to 3 weeks, the patient has had progressive dysphagia symptoms that have come on suddenly.  He is even having issues with liquids in addition to solids.  He has had to regurgitate food at times.  He ate some self prepared pork ribs and barbecue but around 1 PM he was eating this and then he had acute onset of difficulty swallowing.  He has been regurgitating liquids since that time.  He came into the emergency department for further evaluation.  He was given glucagon without effect.  Patient continues to have nausea and vomiting.  He is having some mild chest discomfort as a result of the retching but was not having any of that prior to any of this.  Patient states he has never had this type of symptoms previously before the last 2 to 3 weeks.  He has not had any unintentional weight loss.  The patient denies any family history of GI malignancies.  He has never had an upper endoscopy.  He has taken Tums/Rolaids for heartburn symptoms in the last few weeks.  He is not taking a PPI.  The patient's last dose of Eliquis was this morning.  GI Review of Systems Positive as above Negative for odynophagia, abdominal pain, melena, hematochezia, change in bowel habits   Review of Systems  General: Denies fevers/chills/weight loss unintentionally HEENT: Denies oral lesions Cardiovascular: Denies palpitations Pulmonary: Denies shortness of breath Gastroenterological: See  HPI Genitourinary: Denies darkened urine Hematological: Denies easy bruising/bleeding Dermatological: Denies jaundice Psychological: Mood is anxious to feel better   Histories  Past Medical History Past Medical History:  Diagnosis Date   Arthritis    Bladder cancer ([place])    CVA (cerebral vascular accident) ([place]) 09/2018   History of kidney stones 2012   passed / has renal cyst to evaluated at later time   Hypertension    Wears glasses    Past Surgical History:  Procedure Laterality Date   BUBBLE STUDY  10/01/2018   Procedure: BUBBLE STUDY;  Surgeon: Fay Records, MD;  Location: [facility]OSCOPY;  Service: Cardiovascular;;   COLONOSCOPY  10/25/2007   [facility]. Dr Christian Mate. Polyp(s) of the right colon Minimal Hemorrhoidal disease.   CYSTOSCOPY N/A 11/05/2016   Procedure: CYSTOSCOPY;  Surgeon: Ceasar Mons, MD;  Location: [facility];  Service: Urology;  Laterality: N/A;   CYSTOSCOPY WITH BIOPSY Bilateral 10/24/2016   Procedure: CYSTOSCOPY WITH BLADDER BIOPSY/ BILATERAL RETROGRADE [id number];  Surgeon: Ceasar Mons, MD;  Location: [facility];  Service: Urology;  Laterality: Bilateral;   CYSTOSCOPY WITH BIOPSY N/A 05/12/2018   Procedure: CYSTOSCOPY WITH BLADDER BIOPSY/ BILATERAL RETROGRADE PYELOGRAM BIOPSY;  Surgeon: Ceasar Mons, MD;  Location: [facility];  Service: Urology;  Laterality: N/A;   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 left 03-25-2006;  right 07-07-2006   and Gastroc Slide   fattytumorremoved from back      LOOP RECORDER INSERTION N/A  10/01/2018   Procedure: LOOP RECORDER INSERTION;  Surgeon: Constance Haw, MD;  Location: [place] [facility];  Service: Cardiovascular;  Laterality: N/A;   TEE WITHOUT CARDIOVERSION N/A 10/01/2018   Procedure: TRANSESOPHAGEAL ECHOCARDIOGRAM (TEE);  Surgeon: Fay Records, MD;  Location: [place];  Service:  Cardiovascular;  Laterality: N/A;   TRANSURETHRAL RESECTION OF BLADDER TUMOR N/A 11/05/2016   Procedure: TRANSURETHRAL RESECTION OF BLADDER TUMOR (TURBT);  Surgeon: Ceasar Mons, MD;  Location: [facility];  Service: Urology;  Laterality: N/A;    Allergies Allergies  Allergen Reactions   Statins Hives and Rash    whelps,liver enzymes elevated   Iohexol Other (See Comments)    Pt had sneezing after his CT with IV contrast, lasting only 10- 15 minutes, no intervention was needed, per radiologist    Family History Family History  Problem Relation Age of Onset   Blindness Mother    Cataracts Mother    Diabetes Mother    Glaucoma Mother    Macular degeneration Mother    Cataracts Father    Diabetes Father    Diabetes Sister    Diabetes Brother    Blindness Maternal Uncle    Macular degeneration Maternal Uncle    Macular degeneration Paternal Aunt    Amblyopia Neg Hx    Retinal detachment Neg Hx    Strabismus Neg Hx    Retinitis pigmentosa Neg Hx    The patient's FH is negative for IBD/IBS/Liver Disease/GI Malignancies.  Social History Social History   Socioeconomic History   Marital status: Legally Separated    Spouse name: Not on file   Number of children: Not on file   Years of education: Not on file   Highest education level: Not on file  Occupational History   Occupation: retired  Tobacco Use   Smoking status: Former    Years: 28    Types: Cigarettes    Quit date: 10/22/1999    Years since quitting: 22.5    Passive exposure: Never   Smokeless tobacco: Never   Tobacco comments:    Former smoker 11/22/2020  Vaping Use   Vaping Use: Never used  Substance and Sexual Activity   Alcohol use: Yes    Alcohol/week: 5.0 - 6.0 standard drinks of alcohol    Types: 5 - 6 Cans of beer per week    Comment: 5-6 beers in a sitting 05/30/21   Drug use: No   Sexual activity: Not on file  Other Topics Concern   Not on file  Social History  Narrative   Shaun Ball a 72 year old retired legally separated male He lives at home and has the assistance of his son and Margreta Journey    Social Determinants of Health   Financial Resource Strain: Low Risk  (10/06/2018)   Overall Financial Resource Strain (CARDIA)    Difficulty of Paying Living Expenses: Not hard at all  Food Insecurity: No Food Insecurity (10/06/2018)   Hunger Vital Sign    Worried About Running Out of Food in the Last Year: Never true    [place] in the Last Year: Never true  Transportation Needs: No Transportation Needs (10/06/2018)   PRAPARE - Hydrologist (Medical): No    Lack of Transportation (Non-Medical): No  Physical Activity: Inactive (10/06/2018)   Exercise Vital Sign    Days of Exercise per Week: 0 days    Minutes of Exercise per Session: 0 min  Stress: No Stress Concern Present (10/06/2018)   [place]    Feeling of Stress : Not at all  Social Connections: Not on file  Intimate Partner Violence: Not on file    Medications  Home Medications No current facility-administered medications on file prior to encounter.   Current Outpatient Medications on File Prior to Encounter  Medication Sig Dispense Refill   acetaminophen (TYLENOL) 500 MG tablet Take 500 mg by mouth every 6 (six) hours as needed for moderate pain.     diphenhydrAMINE (BENADRYL) 25 mg capsule Take 25 mg by mouth every 6 (six) hours as needed for allergies.     DULoxetine (CYMBALTA) 30 MG capsule Take 30 mg by mouth daily. (Patient not taking: Reported on 03/10/2022)     ELIQUIS 5 MG TABS tablet Take 1 tablet by mouth twice daily 60 tablet 11   fluticasone (FLONASE) 50 MCG/ACT nasal spray Place 1 spray into both nostrils as needed for allergies or rhinitis.     lisinopril (PRINIVIL,ZESTRIL) 40 MG tablet Take 40 mg by mouth every evening.      Melatonin 5 MG TABS Take 5 mg by mouth at  bedtime.     montelukast (SINGULAIR) 10 MG tablet Take 10 mg by mouth daily as needed (allergies).   6   REPATHA SURECLICK [id number] MG/ML SOAJ Inject as directed every 14 (fourteen) days.     sildenafil (VIAGRA) 100 MG tablet Take 100 mg by mouth daily as needed for erectile dysfunction.     tamsulosin (FLOMAX) 0.4 MG CAPS capsule Take 1 capsule (0.4 mg total) by mouth daily. 30 capsule 11   Scheduled Inpatient Medications  Continuous Inpatient Infusions  lactated ringers 125 mL/hr at 05/04/22 1940   PRN Inpatient Medications [MAR Hold] nitroGLYCERIN   Physical Examination  BP (!) 152/60   Pulse 70   Temp 98.5 F (36.9 C) (Temporal)   Resp (!) 22   Ht 5\' 11"  (1.803 m)   Wt 84.8 kg   SpO2 100%   BMI 26.08 kg/m  GEN: Retching during evaluation, appears stated age, appears mildly toxic  PSYCH: Cooperative, without pressured speech EYE: Conjunctivae pink, sclerae anicteric ENT: MMM CV: Nontachycardic RESP: No audible wheezing GI: NABS, soft, protuberant abdomen, NT/ND, without rebound MSK/EXT: No significant lower extremity edema SKIN: No jaundice NEURO:  Alert & Oriented x 3, no focal deficits   Review of Data  I reviewed the following data at the time of this encounter:  Laboratory Studies   Recent Labs  Lab 05/04/22 1827  NA 137  K 3.7  CL 105  CO2 21*  BUN 14  CREATININE 0.97  GLUCOSE 132*  CALCIUM 9.1   No results for input(s): "AST", "ALT", "GGT", "ALKPHOS" in the last 168 hours.  Invalid input(s): "TBILI", "CONJBILI", "ALB"  Recent Labs  Lab 05/04/22 1827  WBC 8.4  HGB 15.9  HCT 47.3  PLT 179   No results for input(s): "APTT", "INR" in the last 168 hours.  Imaging Studies  Chest x-ray IMPRESSION: No acute abnormalities  GI Procedures and Studies  No relevant upper GI studies to review   Assessment  Shaun. Duffer is a 72 y.o. male with a pmh significant for prior CVA (on Eliquis), nephrolithiasis, hypertension, bladder cancer, colon polyps.  The  GI service is consulted for evaluation and management of acute food impaction and dysphagia.  The patient is hemodynamically stable.  Clinically, he has evidence of what appears to be an  acute food impaction.  The etiology of his food impaction could be stricturing disease or a mass or lesion or dysmotility since he has had issues with liquids as well.  Hopefully will be able to evacuate fluid/bolus.  He is having multiple episodes of retching with his spit up bag present.  Appreciate our anesthesia colleagues being available for his procedure today.  The risks and benefits of endoscopic evaluation were discussed with the patient; these include but are not limited to the risk of perforation, infection, bleeding, missed lesions, lack of diagnosis, severe illness requiring hospitalization, as well as anesthesia and sedation related illnesses.  The patient and/or family is agreeable to proceed.  All patient questions were answered to the best of my ability, and the patient agrees to the aforementioned plan of action with follow-up as indicated.  Pending all goes well, patient will be discharged back to the emergency department and he will be able to go home otherwise if there are any contraindications or issues he will be brought in for observation through the ED.   Plan/Recommendations  Remain n.p.o. at this time Proceed with EGD with anesthesia assistance this evening Further recommendations after EGD is completed   Thank you for this consult.  We will continue to follow.  Please page/call with questions or concerns.   Justice Britain, MD [facility] Office # [phone]

## 2022-05-04 NOTE — Op Note (Signed)
[facility] Patient Name: Shaun Ball Procedure Date: 05/04/2022 MRN: [id number] Attending MD: Justice Britain , MD, [id number] Date of Birth: August 19, 1950 CSN: [id number] Age: 72 Admit Type: Inpatient Procedure:                Upper GI endoscopy Indications:              Dysphagia Providers:                Justice Britain, MD, Doristine Johns, RN,                            Frazier Richards, Technician Referring MD:             Jackquline Denmark, MD, ED Provider Medicines:                General Anesthesia Complications:            No immediate complications. Estimated Blood Loss:     Estimated blood loss was minimal. Procedure:                Pre-Anesthesia Assessment:                           - Prior to the procedure, a History and Physical                            was performed, and patient medications and                            allergies were reviewed. The patient's tolerance of                            previous anesthesia was also reviewed. The risks                            and benefits of the procedure and the sedation                            options and risks were discussed with the patient.                            All questions were answered, and informed consent                            was obtained. Prior Anticoagulants: The patient has                            taken Eliquis (apixaban), last dose was day of                            procedure. ASA Grade Assessment: III - A patient                            with severe systemic disease. After reviewing the  risks and benefits, the patient was deemed in                            satisfactory condition to undergo the procedure.                           After obtaining informed consent, the endoscope was                            passed under direct vision. Throughout the                            procedure, the patient's blood pressure, pulse, and                             oxygen saturations were monitored continuously. The                            [id number] Olympus endoscope was introduced                            through the mouth, and advanced to the second part                            of duodenum. The upper GI endoscopy was                            accomplished without difficulty. The patient                            tolerated the procedure. 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      No other gross lesions were noted in the proximal esophagus and in the       mid esophagus.      Food was found in the distal esophagus. Removal was accomplished with a       with use of endoscope and air insufflation and slow maneuvering of the       endoscope to push the foodstuffs into the stomach.      LA Grade D (one or more mucosal breaks involving at least 75% of       esophageal circumference) esophagitis with no bleeding was found in the       distal 4 cm of the esophagus likely from recent impaction.      The Z-line was irregular and was found 40 cm from the incisors.      Biopsies were taken with a cold forceps in the entire esophagus for       histology to rule out EOE/LOE.      A 3 cm hiatal hernia was present.      Patchy moderately erythematous mucosa without bleeding was found in the       entire examined stomach. Biopsies were taken with a cold forceps for       histology and Helicobacter pylori testing.  No gross lesions were noted in the duodenal bulb, in the first portion       of the duodenum and in the second portion of the duodenum. 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                           - No other gross lesions in the proximal esophagus                            and in the mid esophagus.                           -  Food in the distal esophagus. Removal was                            successful with careful movement of the foodstuffs                            into the stomach.                           - LA Grade D erosive esophagitis with no bleeding                            was found in the distal esophagus after food                            impaction removal.                           - Z-line irregular, 40 cm from the incisors.                           - Biopsies were taken with a cold forceps for                            histology in the entire esophagus to rule out                            EOE/LOE.                           - 3 cm hiatal hernia.                           - Erythematous mucosa in the stomach. Biopsied.                           - No gross lesions in the duodenal bulb, in the  first portion of the duodenum and in the second                            portion of the duodenum. Moderate Sedation:      Not Applicable - Patient had care per Anesthesia. Recommendation:           - The patient will be observed post-procedure,                            until all discharge criteria are met.                           - Return patient to ED for possible discharge same                            day.                           - Observe patient's clinical course.                           - Await pathology results.                           - CLD for 24 hours and then advance to full liquid                            diet for 24 hours and then back to soft diet.                           - May restart Eliquis on 3/25 PM.                           - Start PPI 40 mg twice daily.                           - Carafate liquid twice daily for 1 month.                           - Follow up with Dr. Bettey Mare will be                            arranged in coming weeks.                           - Repeat EGD in 49-months for followup esophagitis                             and rule out Barretis, possible dilation +/-                            further workup for dysmotility if no other issues  arise.                           - The findings and recommendations were discussed                            with the patient.                           - The findings and recommendations were discussed                            with the referring physician. Procedure Code(s):        --- Professional ---                           [phone], Esophagogastroduodenoscopy, flexible,                            transoral; with removal of foreign body(s)                           43239, Esophagogastroduodenoscopy, flexible,                            transoral; with biopsy, single or multiple Diagnosis Code(s):        --- Professional ---                           Q40.2, Other specified congenital malformations of                            stomach                           T18.128A, Food in esophagus causing other injury,                            initial encounter                           K20.80, Other esophagitis without bleeding                           K22.89, Other specified disease of esophagus                           K44.9, Diaphragmatic hernia without obstruction or                            gangrene                           K31.89, Other diseases of stomach and duodenum                           R13.10, Dysphagia, unspecified CPT copyright 2022 American [employer] Association. All rights reserved. The codes documented in this report are preliminary  and upon coder review may  be revised to meet current compliance requirements. Justice Britain, MD 05/04/2022 8:12:52 PM Number of Addenda: 0

## 2022-05-04 NOTE — ED Notes (Signed)
Pt in bed, pt denies chest pain, states that he feels like something is stuck in his throat, NTG Sl given per MD Zenia Resides instructions.  Pt in bed, pt sporadically spitting up/vomiting clear liquid.

## 2022-05-04 NOTE — Transfer of Care (Signed)
Immediate Anesthesia Transfer of Care Note  Patient: Shaun Ball  Procedure(s) Performed: ESOPHAGOGASTRODUODENOSCOPY (EGD) BIOPSY  Patient Location: PACU  Anesthesia Type:General  Level of Consciousness: drowsy and patient cooperative  Airway & Oxygen Therapy: Patient Spontanous Breathing and Patient connected to face mask oxygen  Post-op Assessment: Report given to RN and Post -op Vital signs reviewed and stable  Post vital signs: Reviewed and stable  Last Vitals:  Vitals Value Taken Time  BP 148/74 05/04/22 2008  Temp 36.4 C 05/04/22 2008  Pulse 95 05/04/22 2010  Resp 16 05/04/22 2010  SpO2 97 % 05/04/22 2010  Vitals shown include unvalidated device data.  Last Pain:  Vitals:   05/04/22 2008  TempSrc: Axillary  PainSc: 0-No pain         Complications: No notable events documented.

## 2022-05-04 NOTE — ED Provider Notes (Signed)
[place] EM[facility]ENC[facility] DEPARTMENT AT [facility] Provider Note   CSN: [id number] Arrival date & time: 05/04/22  1615     History  Chief Complaint  Patient presents with   Dysphagia    Shaun Ball is a 72 y.o. male.  Patient presents complaining of food bolus in his esophagus.  States that he has some barbecue and feels like it is stuck in his lower esophagus.  Has had not been able to swallow his spit.  Has had this before in the past but has never had to have an EGD.  Denies any history of esophageal strictures.  No treatment use prior to arrival       Home Medications Prior to Admission medications   Medication Sig Start Date End Date Taking? Authorizing Provider  acetaminophen (TYLENOL) 500 MG tablet Take 500 mg by mouth every 6 (six) hours as needed for moderate pain.    [provider]  diphenhydrAMINE (BENADRYL) 25 mg capsule Take 25 mg by mouth every 6 (six) hours as needed for allergies.    [provider]  DULoxetine (CYMBALTA) 30 MG capsule Take 30 mg by mouth daily. Patient not taking: Reported on 03/10/2022    [provider]  ELIQUIS 5 MG TABS tablet Take 1 tablet by mouth twice daily 12/09/21   Fenton, Clint R, PA  fluticasone (FLONASE) 50 MCG/ACT nasal spray Place 1 spray into both nostrils as needed for allergies or rhinitis.    [provider]  lisinopril (PRINIVIL,ZESTRIL) 40 MG tablet Take 40 mg by mouth every evening.     [provider]  Melatonin 5 MG TABS Take 5 mg by mouth at bedtime.    [provider]  montelukast (SINGULAIR) 10 MG tablet Take 10 mg by mouth daily as needed (allergies).  07/14/17   [provider]  REPATHA SURECLICK [id number] MG/ML SOAJ Inject as directed every 14 (fourteen) days. 12/01/19   [provider]  sildenafil (VIAGRA) 100 MG tablet Take 100 mg by mouth daily as needed for erectile dysfunction.    [provider]  tamsulosin (FLOMAX) 0.4 MG CAPS  capsule Take 1 capsule (0.4 mg total) by mouth daily. 10/24/16   Ceasar Mons, MD      Allergies    Statins and Iohexol    Review of Systems   Review of Systems  All other systems reviewed and are negative.   Physical Exam Updated Vital Signs BP (!) 151/69 (BP Location: Left Arm)   Pulse 80   Temp 98.2 F (36.8 C) (Oral)   Resp (!) 22   Ht 1.803 m (5\' 11" )   Wt 84.8 kg   SpO2 100%   BMI 26.08 kg/m  Physical Exam Vitals and nursing note reviewed.  Constitutional:      General: He is not in acute distress.    Appearance: Normal appearance. He is well-developed. He is not toxic-appearing.  HENT:     Head: Normocephalic and atraumatic.  Eyes:     General: Lids are normal.     Conjunctiva/sclera: Conjunctivae normal.     Pupils: Pupils are equal, round, and reactive to light.  Neck:     Thyroid: No thyroid mass.     Trachea: No tracheal deviation.  Cardiovascular:     Rate and Rhythm: Normal rate and regular rhythm.     Heart sounds: Normal heart sounds. No murmur heard.    No gallop.  Pulmonary:     Effort: Pulmonary effort is  normal. No respiratory distress.     Breath sounds: Normal breath sounds. No stridor. No decreased breath sounds, wheezing, rhonchi or rales.  Abdominal:     General: There is no distension.     Palpations: Abdomen is soft.     Tenderness: There is no abdominal tenderness. There is no rebound.  Musculoskeletal:        General: No tenderness. Normal range of motion.     Cervical back: Normal range of motion and neck supple.  Skin:    General: Skin is warm and dry.     Findings: No abrasion or rash.  Neurological:     Mental Status: He is alert and oriented to person, place, and time. Mental status is at baseline.     GCS: GCS eye subscore is 4. GCS verbal subscore is 5. GCS motor subscore is 6.     Cranial Nerves: No cranial nerve deficit.     Sensory: No sensory deficit.     Motor: Motor function is intact.  Psychiatric:         Attention and Perception: Attention normal.        Speech: Speech normal.        Behavior: Behavior normal.     ED Results / Procedures / Treatments   Labs (all labs ordered are listed, but only abnormal results are displayed) Labs Reviewed - No data to display  EKG None  Radiology No results found.  Procedures Procedures    Medications Ordered in ED Medications  lactated ringers infusion (has no administration in time range)  nitroGLYCERIN (NITROSTAT) SL tablet 0.4 mg (has no administration in time range)  glucagon (human recombinant) (GLUCAGEN) injection 1 mg (has no administration in time range)    ED Course/ Medical Decision Making/ A&P                             Medical Decision Making Risk Prescription drug management.   Patient given glucagon with relief of his symptoms.  Continues to not be able to swallow his spit.  Will consult GI for emergent EGD due to likely food bolus impaction        Final Clinical Impression(s) / ED Diagnoses Final diagnoses:  None    Rx / DC Orders ED Discharge Orders     None         Lacretia Leigh, MD 05/04/22 1809

## 2022-05-04 NOTE — Discharge Instructions (Signed)
YOU HAD AN ENDOSCOPIC PROCEDURE TODAY: Refer to the procedure report and other information in the discharge instructions given to you for any specific questions about what was found during the examination. If this information does not answer your questions, please call [place] office at [phone] to clarify.  ° °YOU SHOULD EXPECT: Some feelings of bloating in the abdomen. Passage of more gas than usual. Walking can help get rid of the air that was put into your GI tract during the procedure and reduce the bloating. If you had a lower endoscopy (such as a colonoscopy or flexible sigmoidoscopy) you may notice spotting of blood in your stool or on the toilet paper. Some abdominal soreness may be present for a day or two, also. ° °DIET: Your first meal following the procedure should be a light meal and then it is ok to progress to your normal diet. A half-sandwich or bowl of soup is an example of a good first meal. Heavy or fried foods are harder to digest and may make you feel nauseous or bloated. Drink plenty of fluids but you should avoid alcoholic beverages for 24 hours. If you had a esophageal dilation, please see attached instructions for diet.   ° °ACTIVITY: Your care partner should take you home directly after the procedure. You should plan to take it easy, moving slowly for the rest of the day. You can resume normal activity the day after the procedure however YOU SHOULD NOT DRIVE, use power tools, machinery or perform tasks that involve climbing or major physical exertion for 24 hours (because of the sedation medicines used during the test).  ° °SYMPTOMS TO REPORT IMMEDIATELY: °A gastroenterologist can be reached at any hour. Please call [phone]  for any of the following symptoms:  °Following lower endoscopy (colonoscopy, flexible sigmoidoscopy) °Excessive amounts of blood in the stool  °Significant tenderness, worsening of abdominal pains  °Swelling of the abdomen that is new, acute  °Fever of 100° or  higher  °Following upper endoscopy (EGD, EUS, ERCP, esophageal dilation) °Vomiting of blood or coffee ground material  °New, significant abdominal pain  °New, significant chest pain or pain under the shoulder blades  °Painful or persistently difficult swallowing  °New shortness of breath  °Black, tarry-looking or red, bloody stools ° °FOLLOW UP:  °If any biopsies were taken you will be contacted by phone or by letter within the next 1-3 weeks. Call [phone]  if you have not heard about the biopsies in 3 weeks.  °Please also call with any specific questions about appointments or follow up tests. ° °

## 2022-05-04 NOTE — Anesthesia Procedure Notes (Signed)
Procedure Name: Intubation Date/Time: 05/04/2022 7:44 PM  Performed by: Montel Clock, CRNAPre-anesthesia Checklist: Patient identified, Emergency Drugs available, Suction available, Patient being monitored and Timeout performed Patient Re-evaluated:Patient Re-evaluated prior to induction Oxygen Delivery Method: Circle system utilized Preoxygenation: Pre-oxygenation with 100% oxygen Induction Type: IV induction, Rapid sequence and Cricoid Pressure applied Laryngoscope Size: Mac and 3 Grade View: Grade II Tube type: Oral Tube size: 7.5 mm Number of attempts: 1 Airway Equipment and Method: Stylet Placement Confirmation: ETT inserted through vocal cords under direct vision, positive ETCO2 and breath sounds checked- equal and bilateral Secured at: 23 cm Tube secured with: Tape Dental Injury: Teeth and Oropharynx as per pre-operative assessment

## 2022-05-04 NOTE — ED Triage Notes (Signed)
Patient is here for evaluation of difficulty swallowing. States that it feels like something is stuck down in the lower part of his throat, not blocking his airway. Reports vomiting.

## 2022-05-05 ENCOUNTER — Telehealth: Payer: Self-pay

## 2022-05-05 NOTE — Telephone Encounter (Signed)
Pt was notified of Dr. Rush Landmark recommendations: Pt was scheduled for an office visit on 06/17/2022 at 8:30 AM with Dr. Lyndel Safe. Pt made aware. Address Provided. Pt stated that he wish to scheduled hs EGD at the time of his office visit.  Pt verbalized understanding with all questions answered.

## 2022-05-05 NOTE — Telephone Encounter (Signed)
Message Received: Shaun Ball, Telford Nab., MD  Jackquline Denmark, MD; Gillermina Hu, RN EGD completed for food impaction acute dysphagia.  Has been sent on PPI twice daily as well as Carafate.  Follow-up in clinic with one of the APP's or Dr. Lyndel Safe in 6 to 8 weeks.  Repeat EGD with Dr. Lyndel Safe in 3 months.  I will update you if anything is found on the pathology.  He is going to be discharged late this evening.  Thanks. GM

## 2022-05-06 ENCOUNTER — Encounter (HOSPITAL_COMMUNITY): Payer: Self-pay | Admitting: Gastroenterology

## 2022-05-06 ENCOUNTER — Encounter: Payer: Self-pay | Admitting: Gastroenterology

## 2022-05-06 LAB — SURGICAL PATHOLOGY

## 2022-05-06 NOTE — Anesthesia Postprocedure Evaluation (Addendum)
Anesthesia Post Note  Patient: Shaun Ball  Procedure(s) Performed: ESOPHAGOGASTRODUODENOSCOPY (EGD) BIOPSY     Patient location during evaluation: PACU Anesthesia Type: General Level of consciousness: awake and alert Pain management: pain level controlled Vital Signs Assessment: post-procedure vital signs reviewed and stable Respiratory status: spontaneous breathing, nonlabored ventilation, respiratory function stable and patient connected to nasal cannula oxygen Cardiovascular status: blood pressure returned to baseline and stable Postop Assessment: no apparent nausea or vomiting Anesthetic complications: no   No notable events documented.  Last Vitals:  Vitals:   05/04/22 2017 05/04/22 2032  BP: (!) 143/79 (!) 143/74  Pulse: 95 84  Resp: 12 10  Temp:    SpO2: 95% 97%    Last Pain:  Vitals:   05/04/22 2032  TempSrc:   PainSc: 0-No pain                 Ericson Nafziger S

## 2022-05-08 ENCOUNTER — Telehealth: Payer: Self-pay | Admitting: *Deleted

## 2022-05-08 ENCOUNTER — Telehealth: Payer: Self-pay | Admitting: Gastroenterology

## 2022-05-08 DIAGNOSIS — A048 Other specified bacterial intestinal infections: Secondary | ICD-10-CM

## 2022-05-08 MED ORDER — BISMUTH/METRONIDAZ/TETRACYCLIN 140-125-125 MG PO CAPS: 3.0000 | ORAL_CAPSULE | Freq: Four times a day (QID) | ORAL | 0 refills | Status: DC

## 2022-05-08 NOTE — Telephone Encounter (Signed)
See lab note from surgery pathology dated 05/04/22 for additional information. 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 I have confirmed that he does not know of any antibiotic allergies. I explained that he will take 3 capsules 4 times daily x 10 days. He already takes pantoprazole twice daily, so no additional PPI needed with the Pylera. In addition, I advised that he needs repeat endoscopy completed in 3 months with Dr Lyndel Safe for confirmation of p pylori irradication and to re-evaluate his inflamed esophagus. He has agreed to go ahead and schedule this on 07/31/22. He will see Dr Lyndel Safe in the office on 06/17/22 and will get instructions at that time. In the meantime, we will request eliquis hold okay from cardiology. Patient verbalizes understanding of all information and denies any questions.

## 2022-05-08 NOTE — Telephone Encounter (Signed)
PT is returning call to discuss lab results. Please advise.

## 2022-05-08 NOTE — Telephone Encounter (Signed)
Request for surgical clearance:     Endoscopy Procedure  What type of surgery is being performed?     Endoscopy   When is this surgery scheduled?     07/31/22  What type of clearance is required ?   Pharmacy  Are there any medications that need to be held prior to surgery and how long? Eliquis, 2 days  Practice name and name of physician performing surgery?      [facility]  What is your office phone and fax number?      Phone- [phone]  [phone]  Anesthesia type (None, local, MAC, general) ?       MAC

## 2022-05-09 NOTE — Telephone Encounter (Signed)
   Patient Name: Shaun Ball  DOB: 1950/05/22 MRN: [id number]  Primary Cardiologist: None  Clinical pharmacists have reviewed the patient's past medical history, labs, and current medications as part of preoperative protocol coverage. The following recommendations have been made:   Patient with diagnosis of atrial fibrillation on Eliquis for anticoagulation.     Procedure: endoscopy Date of procedure: 07/31/2022     CHA2DS2-VASc Score = 4   This indicates a 4.8% annual risk of stroke. 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   Patient will not need bridging with Lovenox (enoxaparin) around procedure. Please resume Eliquis as soon as possible postprocedure, at the discretion of the surgeon.   I will route this recommendation to the requesting party via Epic fax function and remove from pre-op pool.  Please call with questions.  Lenna Sciara, NP 05/09/2022, 3:28 PM

## 2022-05-09 NOTE — Telephone Encounter (Signed)
Patient with diagnosis of atrial fibrillation on Eliquis for anticoagulation.    Procedure: endoscopy Date of procedure: 07/31/2022   CHA2DS2-VASc Score = 4   This indicates a 4.8% annual risk of stroke. 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   Patient will not need bridging with Lovenox (enoxaparin) around procedure.  **This guidance is not considered finalized until pre-operative APP has relayed final recommendations.**

## 2022-05-12 ENCOUNTER — Other Ambulatory Visit: Payer: Self-pay

## 2022-05-22 NOTE — Telephone Encounter (Signed)
Dr Chales Abrahams- Patient has been cleared to hold Eliquis 1 day prior to upcoming procedure with you rather than 2 day hold. Is this ok?

## 2022-05-26 ENCOUNTER — Ambulatory Visit (INDEPENDENT_AMBULATORY_CARE_PROVIDER_SITE_OTHER): Payer: Medicare Other

## 2022-05-26 DIAGNOSIS — I63411 Cerebral infarction due to embolism of right middle cerebral artery: Secondary | ICD-10-CM | POA: Diagnosis not present

## 2022-05-26 LAB — CUP PACEART REMOTE DEVICE CHECK
Date Time Interrogation Session: 20240414230241
Implantable Pulse Generator Implant Date: 20200821

## 2022-05-27 NOTE — Telephone Encounter (Signed)
Perfectly fine RG

## 2022-05-27 NOTE — Telephone Encounter (Signed)
Left VM for patient to return call regarding holding Eliquis one day prior to his procedure.

## 2022-05-28 NOTE — Telephone Encounter (Signed)
Patient advised that per cardiology, he may hold his Eliquis 1 day prior to his upcoming endoscopy procedure on 07/31/22. Patient verbalizes understanding of this information and denies any additional questions.

## 2022-05-29 NOTE — Progress Notes (Signed)
Carelink Summary Report / Loop Recorder 

## 2022-05-30 ENCOUNTER — Ambulatory Visit (HOSPITAL_COMMUNITY)
Admission: RE | Admit: 2022-05-30 | Discharge: 2022-05-30 | Disposition: A | Discharge status: Home / Self Care | Payer: Medicare Other | Source: Ambulatory Visit | Attending: Physician Assistant | Admitting: Physician Assistant

## 2022-05-30 ENCOUNTER — Encounter (HOSPITAL_COMMUNITY): Payer: Self-pay | Admitting: Physician Assistant

## 2022-05-30 VITALS — BP 122/68 | HR 64 | Ht 71.0 in | Wt 186.4 lb

## 2022-05-30 DIAGNOSIS — Z7901 Long term (current) use of anticoagulants: Secondary | ICD-10-CM | POA: Insufficient documentation

## 2022-05-30 DIAGNOSIS — D6869 Other thrombophilia: Secondary | ICD-10-CM | POA: Insufficient documentation

## 2022-05-30 DIAGNOSIS — I4892 Unspecified atrial flutter: Secondary | ICD-10-CM | POA: Insufficient documentation

## 2022-05-30 DIAGNOSIS — I10 Essential (primary) hypertension: Secondary | ICD-10-CM | POA: Diagnosis not present

## 2022-05-30 NOTE — Progress Notes (Signed)
Primary Care Physician: Adrian Prince, MD Primary Electrophysiologist: Dr Elberta Fortis Referring Physician: Dr Shelda Jakes is a 72 y.o. male with a history of HTN, bladder cancer, prior cryptogenic stroke with ILR implant 09/2018, and atrial flutter who presents for follow up in the [facility]. The patient was initially diagnosed with atrial flutter 04/01/20 on his ILR. Patient has a CHADS2VASC score of 4. He denies any awareness of the arrhythmia. He denies significant snoring or alcohol use.   On follow up today, patient reports that he has done well from a cardiac standpoint. He denies any tachypalpitations. No bleeding issues on anticoagulation. ILR shows 0% afib burden.   Today, he denies symptoms of palpitations, chest pain, shortness of breath, orthopnea, PND, lower extremity edema, dizziness, presyncope, syncope, snoring, daytime somnolence, bleeding, or neurologic sequela. The patient is tolerating medications without difficulties and is otherwise without complaint today.    Atrial Fibrillation Risk Factors:  he does not have symptoms or diagnosis of sleep apnea. he does not have a history of rheumatic fever. he does not have a history of alcohol use. The patient does not have a history of early familial atrial fibrillation or other arrhythmias.  he has a BMI of Body mass index is 26 kg/m.Marland Kitchen Filed Weights   05/30/22 0945  Weight: 84.6 kg   Family History  Problem Relation Age of Onset   Blindness Mother    Cataracts Mother    Diabetes Mother    Glaucoma Mother    Macular degeneration Mother    Cataracts Father    Diabetes Father    Diabetes Sister    Diabetes Brother    Blindness Maternal Uncle    Macular degeneration Maternal Uncle    Macular degeneration Paternal Aunt    Amblyopia Neg Hx    Retinal detachment Neg Hx    Strabismus Neg Hx    Retinitis pigmentosa Neg Hx      Atrial Fibrillation Management  history:  Previous antiarrhythmic drugs: none Previous cardioversions: none Previous ablations: none CHADS2VASC score: 4 Anticoagulation history: Eliquis   Past Medical History:  Diagnosis Date   Arthritis    Bladder cancer    CVA (cerebral vascular accident) 09/2018   History of kidney stones 2012   passed / has renal cyst to evaluated at later time   Hypertension    Wears glasses    Past Surgical History:  Procedure Laterality Date   BIOPSY  05/04/2022   Procedure: BIOPSY;  Surgeon: Lemar Lofty., MD;  Location: [facility];  Service: Gastroenterology;;   Thressa Sheller STUDY  10/01/2018   Procedure: BUBBLE STUDY;  Surgeon: Pricilla Riffle, MD;  Location: [facility] END[facility];  Service: Cardiovascular;;   COLONOSCOPY  10/25/2007   [facility]. Dr Claudine Mouton. Polyp(s) of the right colon Minimal Hemorrhoidal disease.   CYSTOSCOPY N/A 11/05/2016   Procedure: CYSTOSCOPY;  Surgeon: Rene Paci, MD;  Location: [facility];  Service: Urology;  Laterality: N/A;   CYSTOSCOPY WITH BIOPSY Bilateral 10/24/2016   Procedure: CYSTOSCOPY WITH BLADDER BIOPSY/ BILATERAL RETROGRADE [id number];  Surgeon: Rene Paci, MD;  Location: [facility];  Service: Urology;  Laterality: Bilateral;   CYSTOSCOPY WITH BIOPSY N/A 05/12/2018   Procedure: CYSTOSCOPY WITH BLADDER BIOPSY/ BILATERAL RETROGRADE PYELOGRAM BIOPSY;  Surgeon: Rene Paci, MD;  Location: [facility];  Service: Urology;  Laterality: N/A;   ESOPHAGOGASTRODUODENOSCOPY N/A 05/04/2022   Procedure: ESOPHAGOGASTRODUODENOSCOPY (EGD);  Surgeon: Corliss Parish  Montez Hageman., MD;  Location: [facility];  Service: Gastroenterology;  Laterality: N/A;   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 left 03-25-2006;  right 07-07-2006   and Gastroc Slide   fattytumorremoved from back      FOREIGN BODY REMOVAL  05/04/2022   Procedure: FOREIGN BODY REMOVAL;   Surgeon: Meridee Score Netty Starring., MD;  Location: [facility];  Service: Gastroenterology;;   LOOP RECORDER INSERTION N/A 10/01/2018   Procedure: LOOP RECORDER INSERTION;  Surgeon: Regan Lemming, MD;  Location: [facility];  Service: Cardiovascular;  Laterality: N/A;   TEE WITHOUT CARDIOVERSION N/A 10/01/2018   Procedure: TRANSESOPHAGEAL ECHOCARDIOGRAM (TEE);  Surgeon: Pricilla Riffle, MD;  Location: [facility];  Service: Cardiovascular;  Laterality: N/A;   TRANSURETHRAL RESECTION OF BLADDER TUMOR N/A 11/05/2016   Procedure: TRANSURETHRAL RESECTION OF BLADDER TUMOR (TURBT);  Surgeon: Rene Paci, MD;  Location: [facility];  Service: Urology;  Laterality: N/A;    Current Outpatient Medications  Medication Sig Dispense Refill   acetaminophen (TYLENOL) 500 MG tablet Take 500 mg by mouth every 6 (six) hours as needed for moderate pain.     apixaban (ELIQUIS) 5 MG TABS tablet Take 1 tablet (5 mg total) by mouth 2 (two) times daily. 60 tablet 11   diphenhydrAMINE (BENADRYL) 25 mg capsule Take 25 mg by mouth every 6 (six) hours as needed for allergies.     DULoxetine (CYMBALTA) 30 MG capsule Take 30 mg by mouth daily.     fluticasone (FLONASE) 50 MCG/ACT nasal spray Place 1 spray into both nostrils as needed for allergies or rhinitis.     lisinopril (PRINIVIL,ZESTRIL) 40 MG tablet Take 40 mg by mouth every evening.      Melatonin 5 MG TABS Take 5 mg by mouth at bedtime.     montelukast (SINGULAIR) 10 MG tablet Take 10 mg by mouth daily as needed (allergies).   6   oxybutynin (DITROPAN-XL) 5 MG 24 hr tablet Take 5 mg by mouth daily.     pantoprazole (PROTONIX) 40 MG tablet Take 1 tablet (40 mg total) by mouth 2 (two) times daily before a meal. 60 tablet 12   REPATHA SURECLICK 140 MG/ML SOAJ Inject as directed every 14 (fourteen) days.     sildenafil (VIAGRA) 100 MG tablet Take 100 mg by mouth daily as needed for erectile dysfunction.     sucralfate  (CARAFATE) 1 GM/10ML suspension Take 10 mLs (1 g total) by mouth 2 (two) times daily. 420 mL 1   tamsulosin (FLOMAX) 0.4 MG CAPS capsule Take 1 capsule (0.4 mg total) by mouth daily. 30 capsule 11   Bismuth/Metronidaz/Tetracyclin (PYLERA) 140-125-125 MG CAPS Take 3 capsules by mouth in the morning, at noon, in the evening, and at bedtime for 10 days. 120 capsule 0   No current facility-administered medications for this encounter.    Allergies  Allergen Reactions   Statins Hives and Rash    whelps,liver enzymes elevated   Iohexol Other (See Comments)    Pt had sneezing after his CT with IV contrast, lasting only 10- 15 minutes, no intervention was needed, per radiologist    Social History   Socioeconomic History   Marital status: Legally Separated    Spouse name: Not on file   Number of children: Not on file   Years of education: Not on file   Highest education level: Not on file  Occupational History   Occupation: retired  Tobacco Use   Smoking status: Former  Years: 77    Types: Cigarettes    Quit date: 10/22/1999    Years since quitting: 22.6    Passive exposure: Never   Smokeless tobacco: Never   Tobacco comments:    Former smoker 11/22/2020  Vaping Use   Vaping Use: Never used  Substance and Sexual Activity   Alcohol use: Yes    Alcohol/week: 5.0 - 6.0 standard drinks of alcohol    Types: 5 - 6 Cans of beer per week    Comment: 5-6 beers in a sitting 05/30/21   Drug use: No   Sexual activity: Not on file  Other Topics Concern   Not on file  Social History Narrative   Shaun Ball a 72 year old retired legally separated male He lives at home and has the assistance of his son and Trula Ore    Social Determinants of Health   Financial Resource Strain: Low Risk  (10/06/2018)   Overall Financial Resource Strain (CARDIA)    Difficulty of Paying Living Expenses: Not hard at all  Food Insecurity: No Food Insecurity (10/06/2018)   Hunger Vital Sign    Worried  About Running Out of Food in the Last Year: Never true    Ran Out of Food in the Last Year: Never true  Transportation Needs: No Transportation Needs (10/06/2018)   PRAPARE - Administrator, Civil Service (Medical): No    Lack of Transportation (Non-Medical): No  Physical Activity: Inactive (10/06/2018)   Exercise Vital Sign    Days of Exercise per Week: 0 days    Minutes of Exercise per Session: 0 min  Stress: No Stress Concern Present (10/06/2018)   [employer] of Occupational Health - Occupational Stress Questionnaire    Feeling of Stress : Not at all  Social Connections: Not on file  Intimate Partner Violence: Not on file     ROS- All systems are reviewed and negative except as per the HPI above.  Physical Exam: Vitals:   05/30/22 0945  BP: 122/68  Pulse: 64  Weight: 84.6 kg  Height:  (1.803 m)    GEN- The patient is a well appearing male, alert and oriented x 3 today.   HEENT-head normocephalic, atraumatic, sclera clear, conjunctiva pink, hearing intact, trachea midline. Lungs- Clear to ausculation bilaterally, normal work of breathing Heart- Regular rate and rhythm, no murmurs, rubs or gallops  GI- soft, NT, ND, + BS Extremities- no clubbing, cyanosis, or edema MS- no significant deformity or atrophy Skin- no rash or lesion Psych- euthymic mood, full affect Neuro- strength and sensation are intact   Wt Readings from Last 3 Encounters:  05/30/22 84.6 kg  05/04/22 84.8 kg  04/10/22 85.1 kg    EKG today demonstrates  SR Vent. rate 64 BPM PR interval 192 ms QRS duration 86 ms QT/QTcB 376/387 ms  Echo 09/29/18 demonstrated  1. The left ventricle has normal systolic function with an ejection  fraction of 60-65%. The cavity size was normal. Left ventricular diastolic parameters were normal.   2. The right ventricle has normal systolic function. The cavity was  normal. There is no increase in right ventricular wall thickness.   3. Mild  thickening of the mitral valve leaflet.   4. The aortic valve is tricuspid. Mild thickening of the aortic valve.  Sclerosis without any evidence of stenosis of the aortic valve.   5. The aorta is normal unless otherwise noted.   Epic records are reviewed at length today  CHA2DS2-VASc  Score = 4  The patient's score is based upon: CHF History: 0 HTN History: 1 Diabetes History: 0 Stroke History: 2 Vascular Disease History: 0 Age Score: 1 Gender Score: 0       ASSESSMENT AND PLAN: 1. Atrial flutter The patient's CHA2DS2-VASc score is 4, indicating a 4.8% annual risk of stroke.   ILR continues to show 0% afib burden. Continue Eliquis 5 mg BID  2. Secondary Hypercoagulable State (ICD10:  D68.69) The patient is at significant risk for stroke/thromboembolism based upon his CHA2DS2-VASc Score of 4.  Continue Apixaban (Eliquis).   3. HTN Stable, no changes today.   Follow up with Dr Elberta Fortis or EP APP per recall. AF clinic as needed.    Jorja Loa PA-C [facility] [address] [place], [place] [address] [phone] 05/30/2022 10:19 AM

## 2022-06-17 ENCOUNTER — Ambulatory Visit: Payer: Medicare Other | Admitting: Gastroenterology

## 2022-06-17 ENCOUNTER — Encounter: Payer: Self-pay | Admitting: Gastroenterology

## 2022-06-17 VITALS — BP 130/78 | HR 62 | Ht 71.0 in | Wt 187.0 lb

## 2022-06-17 DIAGNOSIS — A048 Other specified bacterial intestinal infections: Secondary | ICD-10-CM

## 2022-06-17 DIAGNOSIS — R1310 Dysphagia, unspecified: Secondary | ICD-10-CM | POA: Diagnosis not present

## 2022-06-17 DIAGNOSIS — K219 Gastro-esophageal reflux disease without esophagitis: Secondary | ICD-10-CM

## 2022-06-17 NOTE — Patient Instructions (Signed)
_______________________________________________________  If your blood pressure at your visit was 140/90 or greater, please contact your primary care physician to follow up on this.  _______________________________________________________  If you are age 73 or older, your body mass index should be between 23-30. Your Body mass index is 26.08 kg/m. If this is out of the aforementioned range listed, please consider follow up with your Primary Care Provider.  If you are age 25 or younger, your body mass index should be between 19-25. Your Body mass index is 26.08 kg/m. If this is out of the aformentioned range listed, please consider follow up with your Primary Care Provider.   ________________________________________________________  The [facility] providers would like to encourage you to use [facility] to communicate with providers for non-urgent requests or questions.  Due to long hold times on the telephone, sending your provider a message by [facility] may be a faster and more efficient way to get a response.  Please allow 48 business hours for a response.  Please remember that this is for non-urgent requests.  _______________________________________________________  Bonita Quin have been scheduled for an endoscopy. Please follow written instructions given to you at your visit today. If you use inhalers (even only as needed), please bring them with you on the day of your procedure.   Continue protonix Stop carafate  Hold Eliquis 1 day prior to your upcoming procedure.  Please call with any questions or concerns.  Thank you,  Dr. Lynann Bologna

## 2022-06-17 NOTE — Progress Notes (Signed)
Chief Complaint: For EGD with dil  Referring Provider:  Adrian Prince, MD      ASSESSMENT AND PLAN;   #1. GERD/HH/EE Gd IV with food impaction s/p endo disimpaction 05/04/2022. 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 HP gastritis. Treated with Pylera 04/2022.  #3. H/O polyps. Rpt colon 03/2025  #4. A Fib/CVA on eliquis  Plan: -Rpt EGD with dil and Bx (scheduled)- after cardio clearence and hold eliquis x 24 hrs. -Continue protonix 40mg  po BID  -Stop carafate -Chew food especially meats and breads well and eat slowly.   HPI:    Shaun Ball is a 72 y.o. male  With multiple medical problems as below including A Fib/CVA on eliquis, HTN, Nl EF 60-65%, bladder cancer, prior cryptogenic stroke with ILR implant 09/2018  FU from food impaction s/p endoscopic disimpaction 05/04/2022 EGD showed LA grade D esophagitis, 3 cm HH, H. pylori gastritis treated with triple drug therapy.  Esophageal bx-consistent with reflux.  Eosinophils 12 per high-power field (less than 15 to meet definite EOE criteria)  Has done very well on Protonix 40 mg p.o. twice daily and Carafate.  No further problems.  No further dysphagia.  No heartburn.  Here to set up repeat EGD with dilation.  No melena or hematochezia.  No significant diarrhea or constipation.  No melena or hematochezia. No unintentional weight loss. No abdominal pain.  H/O polyps.   Previous GI workup:  EGD with food disimpaction 05/04/2022 - Small ectopic gastric mucosa in the proximal esophagus noted. - No other gross lesions in the proximal esophagus and in the mid esophagus. - Food in the distal esophagus. Removal was successful with careful movement of the foodstuffs into the stomach. - LA Grade D erosive esophagitis with no bleeding was found in the distal esophagus after food impaction removal. - Z-line irregular, 40 cm from the incisors. - Biopsies were taken with a cold forceps for histology in the entire esophagus to rule out EOE/LOE. - 3 cm hiatal  hernia. - Erythematous mucosa in the stomach. Biopsied. - No gross lesions in the duodenal -Bx- + HP, GERD.  Eos 12/HPF-reflux or mild EOE   Colonoscopy 04/07/2022 -Colonic polyps s/p polypectomy. Bx- TA -Repeat in 3 years.   Colonoscopy 10/2007 at [facility] colonic polyp s/p polypectomy, internal hemorrhoids. Bx- TA Rpt 5 yrs   Past Medical History:  Diagnosis Date   Arthritis    Bladder cancer (HCC)    CVA (cerebral vascular accident) (HCC) 09/2018   History of kidney stones 2012   passed / has renal cyst to evaluated at later time   Hypertension    Wears glasses     Past Surgical History:  Procedure Laterality Date   BIOPSY  05/04/2022   Procedure: BIOPSY;  Surgeon: Lemar Lofty., MD;  Location: [facility];  Service: Gastroenterology;;   Thressa Sheller STUDY  10/01/2018   Procedure: BUBBLE STUDY;  Surgeon: Pricilla Riffle, MD;  Location: [facility];  Service: Cardiovascular;;   COLONOSCOPY  10/25/2007   [facility]. Dr Claudine Mouton. Polyp(s) of the right colon Minimal Hemorrhoidal disease.   CYSTOSCOPY N/A 11/05/2016   Procedure: CYSTOSCOPY;  Surgeon: Rene Paci, MD;  Location: [facility];  Service: Urology;  Laterality: N/A;   CYSTOSCOPY WITH BIOPSY Bilateral 10/24/2016   Procedure: CYSTOSCOPY WITH BLADDER BIOPSY/ BILATERAL RETROGRADE [id number];  Surgeon: Rene Paci, MD;  Location: [facility];  Service: Urology;  Laterality: Bilateral;   CYSTOSCOPY WITH BIOPSY N/A 05/12/2018  Procedure: CYSTOSCOPY WITH BLADDER BIOPSY/ BILATERAL RETROGRADE PYELOGRAM BIOPSY;  Surgeon: Rene Paci, MD;  Location: [facility];  Service: Urology;  Laterality: N/A;   ESOPHAGOGASTRODUODENOSCOPY N/A 05/04/2022   Procedure: ESOPHAGOGASTRODUODENOSCOPY (EGD);  Surgeon: Lemar Lofty., MD;  Location: [facility];  Service: Gastroenterology;  Laterality: N/A;   EXCISION HAGLUND'S DEFORMITY WITH  ACHILLES TENDON REPAIR Bilateral left 03-25-2006;  right 07-07-2006   and Gastroc Slide   fattytumorremoved from back      FOREIGN BODY REMOVAL  05/04/2022   Procedure: FOREIGN BODY REMOVAL;  Surgeon: Meridee Score Netty Starring., MD;  Location: [facility];  Service: Gastroenterology;;   LOOP RECORDER INSERTION N/A 10/01/2018   Procedure: LOOP RECORDER INSERTION;  Surgeon: Regan Lemming, MD;  Location: [facility];  Service: Cardiovascular;  Laterality: N/A;   TEE WITHOUT CARDIOVERSION N/A 10/01/2018   Procedure: TRANSESOPHAGEAL ECHOCARDIOGRAM (TEE);  Surgeon: Pricilla Riffle, MD;  Location: [facility];  Service: Cardiovascular;  Laterality: N/A;   TRANSURETHRAL RESECTION OF BLADDER TUMOR N/A 11/05/2016   Procedure: TRANSURETHRAL RESECTION OF BLADDER TUMOR (TURBT);  Surgeon: Rene Paci, MD;  Location: [facility];  Service: Urology;  Laterality: N/A;    Family History  Problem Relation Age of Onset   Blindness Mother    Cataracts Mother    Diabetes Mother    Glaucoma Mother    Macular degeneration Mother    Cataracts Father    Diabetes Father    Diabetes Sister    Diabetes Brother    Blindness Maternal Uncle    Macular degeneration Maternal Uncle    Macular degeneration Paternal Aunt    Amblyopia Neg Hx    Retinal detachment Neg Hx    Strabismus Neg Hx    Retinitis pigmentosa Neg Hx     Social History   Tobacco Use   Smoking status: Former    Years: 28    Types: Cigarettes    Quit date: 10/22/1999    Years since quitting: 22.6    Passive exposure: Never   Smokeless tobacco: Never   Tobacco comments:    Former smoker 11/22/2020  Vaping Use   Vaping Use: Never used  Substance Use Topics   Alcohol use: Yes    Alcohol/week: 5.0 - 6.0 standard drinks of alcohol    Types: 5 - 6 Cans of beer per week    Comment: 5-6 beers in a sitting 05/30/21   Drug use: No    Current Outpatient Medications  Medication Sig Dispense Refill    acetaminophen (TYLENOL) 500 MG tablet Take 500 mg by mouth every 6 (six) hours as needed for moderate pain.     apixaban (ELIQUIS) 5 MG TABS tablet Take 1 tablet (5 mg total) by mouth 2 (two) times daily. 60 tablet 11   diphenhydrAMINE (BENADRYL) 25 mg capsule Take 25 mg by mouth every 6 (six) hours as needed for allergies.     fluticasone (FLONASE) 50 MCG/ACT nasal spray Place 1 spray into both nostrils as needed for allergies or rhinitis.     lisinopril (PRINIVIL,ZESTRIL) 40 MG tablet Take 40 mg by mouth every evening.      Melatonin 5 MG TABS Take 5 mg by mouth at bedtime.     montelukast (SINGULAIR) 10 MG tablet Take 10 mg by mouth daily as needed (allergies).   6   oxybutynin (DITROPAN-XL) 5 MG 24 hr tablet Take 5 mg by mouth daily.     pantoprazole (PROTONIX) 40 MG tablet Take 1 tablet (40  mg total) by mouth 2 (two) times daily before a meal. 60 tablet 12   REPATHA SURECLICK 140 MG/ML SOAJ Inject as directed every 14 (fourteen) days.     sildenafil (VIAGRA) 100 MG tablet Take 100 mg by mouth daily as needed for erectile dysfunction.     sucralfate (CARAFATE) 1 GM/10ML suspension Take 10 mLs (1 g total) by mouth 2 (two) times daily. 420 mL 1   tamsulosin (FLOMAX) 0.4 MG CAPS capsule Take 1 capsule (0.4 mg total) by mouth daily. 30 capsule 11   DULoxetine (CYMBALTA) 30 MG capsule Take 30 mg by mouth daily. (Patient not taking: Reported on 06/17/2022)     No current facility-administered medications for this visit.    Allergies  Allergen Reactions   Statins Hives and Rash    whelps,liver enzymes elevated   Iohexol Other (See Comments)    Pt had sneezing after his CT with IV contrast, lasting only 10- 15 minutes, no intervention was needed, per radiologist    Review of Systems:  neg Psychiatric/Behavioral: H/O anxiety or depression     Physical Exam:    BP 130/78   Pulse 62   Ht 5\' 11"  (1.803 m)   Wt 187 lb (84.8 kg)   BMI 26.08 kg/m  Wt Readings from Last 3 Encounters:   06/17/22 187 lb (84.8 kg)  05/30/22 186 lb 6.4 oz (84.6 kg)  05/04/22 187 lb (84.8 kg)   Constitutional:  Well-developed, in no acute distress. Psychiatric: Normal mood and affect. Behavior is normal. HEENT: Pupils normal.  Conjunctivae are normal. No scleral icterus. Cardiovascular: Normal rate, regular rhythm. No edema.  Has implantable rhythm monitor. Pulmonary/chest: Effort normal and breath sounds normal. No wheezing, rales or rhonchi. Abdominal: Soft, nondistended. Nontender. Bowel sounds active throughout. There are no masses palpable. No hepatomegaly. Rectal: Deferred Neurological: Alert and oriented to person place and time. Skin: Skin is warm and dry. No rashes noted.  Data Reviewed: I have personally reviewed following labs and imaging studies  CBC:    Latest Ref Rng & Units 05/04/2022    6:27 PM 05/30/2021    1:42 PM 05/23/2020   11:30 AM  CBC  WBC 4.0 - 10.5 K/uL 8.4  5.9  6.0   Hemoglobin 13.0 - 17.0 g/dL 16.1  09.6  04.5   Hematocrit 39.0 - 52.0 % 47.3  46.7  50.1   Platelets 150 - 400 K/uL 179  178  206     CMP:    Latest Ref Rng & Units 05/04/2022    6:27 PM 05/30/2021    1:42 PM 05/23/2020   11:30 AM  CMP  Glucose 70 - 99 mg/dL 409  811  91   BUN 8 - 23 mg/dL 14  17  14    Creatinine 0.61 - 1.24 mg/dL 9.14  7.82  9.56   Sodium 135 - 145 mmol/L 137  138  139   Potassium 3.5 - 5.1 mmol/L 3.7  4.4  4.1   Chloride 98 - 111 mmol/L 105  109  106   CO2 22 - 32 mmol/L 21  22  28    Calcium 8.9 - 10.3 mg/dL 9.1  8.9  9.2       Radiology Studies: CUP PACEART REMOTE DEVICE CHECK  Result Date: 05/26/2022 ILR summary report received. Battery status OK. Normal device function. No new symptom, tachy, brady, or pause episodes. No new AF episodes. Monthly summary reports and ROV/PRN LA, CVRS     Edman Circle, MD 06/17/2022,  8:40 AM  Cc: Adrian Prince, MD

## 2022-06-30 ENCOUNTER — Ambulatory Visit (INDEPENDENT_AMBULATORY_CARE_PROVIDER_SITE_OTHER): Payer: Medicare Other

## 2022-06-30 DIAGNOSIS — I4892 Unspecified atrial flutter: Secondary | ICD-10-CM | POA: Diagnosis not present

## 2022-06-30 LAB — CUP PACEART REMOTE DEVICE CHECK
Date Time Interrogation Session: 20240517230502
Implantable Pulse Generator Implant Date: 20200821

## 2022-06-30 NOTE — Progress Notes (Signed)
Carelink Summary Report / Loop Recorder 

## 2022-07-09 ENCOUNTER — Ambulatory Visit: Payer: Medicare Other | Admitting: Gastroenterology

## 2022-07-25 NOTE — Progress Notes (Signed)
Carelink Summary Report / Loop Recorder 

## 2022-07-31 ENCOUNTER — Ambulatory Visit: Payer: Medicare Other | Admitting: Gastroenterology

## 2022-07-31 ENCOUNTER — Encounter: Payer: Self-pay | Admitting: Gastroenterology

## 2022-07-31 VITALS — BP 133/69 | HR 65 | Temp 99.1°F | Ht 71.0 in | Wt 187.0 lb

## 2022-07-31 DIAGNOSIS — K219 Gastro-esophageal reflux disease without esophagitis: Secondary | ICD-10-CM

## 2022-07-31 DIAGNOSIS — R1310 Dysphagia, unspecified: Secondary | ICD-10-CM

## 2022-07-31 MED ORDER — SODIUM CHLORIDE 0.9 % IV SOLN: 500.0000 mL | INTRAVENOUS | Status: DC

## 2022-07-31 NOTE — Progress Notes (Signed)
He did not stop his Eliquis. EGD with biopsy/dilatation rescheduled RG

## 2022-07-31 NOTE — Progress Notes (Signed)
Pt did not hold his Eliquis.  Procedure has been rescheduled.

## 2022-08-04 ENCOUNTER — Ambulatory Visit (INDEPENDENT_AMBULATORY_CARE_PROVIDER_SITE_OTHER): Payer: Medicare Other

## 2022-08-04 DIAGNOSIS — I63411 Cerebral infarction due to embolism of right middle cerebral artery: Secondary | ICD-10-CM

## 2022-08-04 LAB — CUP PACEART REMOTE DEVICE CHECK
Date Time Interrogation Session: 20240623230048
Implantable Pulse Generator Implant Date: 20200821

## 2022-08-08 ENCOUNTER — Ambulatory Visit (AMBULATORY_SURGERY_CENTER): Payer: Medicare Other | Admitting: Gastroenterology

## 2022-08-08 ENCOUNTER — Encounter: Payer: Self-pay | Admitting: Gastroenterology

## 2022-08-08 VITALS — BP 115/72 | HR 86 | Temp 98.9°F | Resp 23 | Ht 71.0 in | Wt 187.0 lb

## 2022-08-08 DIAGNOSIS — K219 Gastro-esophageal reflux disease without esophagitis: Secondary | ICD-10-CM

## 2022-08-08 DIAGNOSIS — K222 Esophageal obstruction: Secondary | ICD-10-CM | POA: Diagnosis not present

## 2022-08-08 DIAGNOSIS — K2289 Other specified disease of esophagus: Secondary | ICD-10-CM | POA: Diagnosis not present

## 2022-08-08 DIAGNOSIS — K2950 Unspecified chronic gastritis without bleeding: Secondary | ICD-10-CM | POA: Diagnosis not present

## 2022-08-08 MED ORDER — SODIUM CHLORIDE 0.9 % IV SOLN: 500.0000 mL | Freq: Once | INTRAVENOUS | Status: DC

## 2022-08-08 NOTE — Op Note (Signed)
[facility] Patient Name: Shaun Ball Procedure Date: 08/08/2022 10:41 AM MRN: [id number] Endoscopist: Lynann Bologna , MD, [id number] Age: 72 Referring MD:  Date of Birth: 1950-04-09 Gender: Male Account #: [id number] Procedure:                Upper GI endoscopy Indications:              Dysphagia- Pt with Food impaction s/p endoscopic                            disimpaction 05/04/2022. Previous history of H.                            pylori gastritis. Also previous esophageal biopsies                            suspicious for EOE. Medicines:                Monitored Anesthesia Care Procedure:                Pre-Anesthesia Assessment:                           - Prior to the procedure, a History and Physical                            was performed, and patient medications and                            allergies were reviewed. The patient's tolerance of                            previous anesthesia was also reviewed. The risks                            and benefits of the procedure and the sedation                            options and risks were discussed with the patient.                            All questions were answered, and informed consent                            was obtained. Prior Anticoagulants: Eliquis was                            held 1 day prior. ASA Grade Assessment: III - A                            patient with severe systemic disease. After                            reviewing the risks and benefits, the patient was  deemed in satisfactory condition to undergo the                            procedure.                           After obtaining informed consent, the endoscope was                            passed under direct vision. Throughout the                            procedure, the patient's blood pressure, pulse, and                            oxygen saturations were monitored continuously. The                             GIF [id number] #[id number] was introduced through the                            mouth, and advanced to the second part of duodenum.                            The upper GI endoscopy was accomplished without                            difficulty. The patient tolerated the procedure                            well. Scope In: Scope Out: Findings:                 Incidental inlet patch was noted in the proximal                            esophagus. The lower third of the esophagus was                            mildly tortuous. Biopsies were obtained from the                            proximal and distal esophagus with cold forceps for                            histology of suspected eosinophilic esophagitis.                           One benign-appearing, intrinsic mild stenosis was                            found 38 cm from the incisors, At GE junction..                            This stenosis measured 1.4 cm (inner  diameter). The                            stenosis was traversed. The scope was withdrawn.                            Dilation was performed with a Maloney dilator with                            mild resistance at 52 Fr. The Z-line was mildly                            irregular.Biopsies were taken with a cold forceps                            for histology.                           A 2 cm hiatal hernia was present.                           The entire examined stomach was normal. Biopsies                            were taken with a cold forceps for histology to                            ensure eradication of HP.                           The examined duodenum was normal. Complications:            No immediate complications. Estimated Blood Loss:     Estimated blood loss: none. Impression:               - Benign-appearing esophageal stenosis. Dilated.                            Biopsied.                           - 2 cm hiatal hernia. Recommendation:           -  Patient has a contact number available for                            emergencies. The signs and symptoms of potential                            delayed complications were discussed with the                            patient. Return to normal activities tomorrow.                            Written discharge instructions were provided to the  patient.                           - Post dilatation diet.                           - Continue present medications. Can reduce Protonix                            to 40 mg p.o. daily                           - Await pathology results.                           - Resume Eliquis 6/29 PM                           - Chew foods well and eat slowly.                           - The findings and recommendations were discussed                            with the patient's family. Lynann Bologna, MD 08/08/2022 11:07:57 AM This report has been signed electronically.

## 2022-08-08 NOTE — Progress Notes (Signed)
Pt's states no medical or surgical changes since previsit or office visit. 

## 2022-08-08 NOTE — Progress Notes (Signed)
Chief Complaint: For EGD with dil  Referring Provider:  Adrian Prince, MD      ASSESSMENT AND PLAN;   #1. GERD/HH/EE Gd IV with food impaction s/p endo disimpaction 05/04/2022. 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 HP gastritis. Treated with Pylera 04/2022.  #3. H/O polyps. Rpt colon 03/2025  #4. A Fib/CVA on eliquis  Plan: -Rpt EGD with dil and Bx (scheduled)- after cardio clearence and hold eliquis x 24 hrs. -Continue protonix 40mg  po BID  -Stop carafate -Chew food especially meats and breads well and eat slowly.   HPI:    Shaun Ball is a 72 y.o. male  With multiple medical problems as below including A Fib/CVA on eliquis, HTN, Nl EF 60-65%, bladder cancer, prior cryptogenic stroke with ILR implant 09/2018  FU from food impaction s/p endoscopic disimpaction 05/04/2022 EGD showed LA grade D esophagitis, 3 cm HH, H. pylori gastritis treated with triple drug therapy.  Esophageal bx-consistent with reflux.  Eosinophils 12 per high-power field (less than 15 to meet definite EOE criteria)  Has done very well on Protonix 40 mg p.o. twice daily and Carafate.  No further problems.  No further dysphagia.  No heartburn.  Here to set up repeat EGD with dilation.  No melena or hematochezia.  No significant diarrhea or constipation.  No melena or hematochezia. No unintentional weight loss. No abdominal pain.  H/O polyps.   Previous GI workup:  EGD with food disimpaction 05/04/2022 - Small ectopic gastric mucosa in the proximal esophagus noted. - No other gross lesions in the proximal esophagus and in the mid esophagus. - Food in the distal esophagus. Removal was successful with careful movement of the foodstuffs into the stomach. - LA Grade D erosive esophagitis with no bleeding was found in the distal esophagus after food impaction removal. - Z-line irregular, 40 cm from the incisors. - Biopsies were taken with a cold forceps for histology in the entire esophagus to rule out EOE/LOE. - 3 cm hiatal  hernia. - Erythematous mucosa in the stomach. Biopsied. - No gross lesions in the duodenal -Bx- + HP, GERD.  Eos 12/HPF-reflux or mild EOE   Colonoscopy 04/07/2022 -Colonic polyps s/p polypectomy. Bx- TA -Repeat in 3 years.   Colonoscopy 10/2007 at [facility] colonic polyp s/p polypectomy, internal hemorrhoids. Bx- TA Rpt 5 yrs   Past Medical History:  Diagnosis Date   Arthritis    Bladder cancer (HCC)    CVA (cerebral vascular accident) (HCC) 09/2018   History of kidney stones 2012   passed / has renal cyst to evaluated at later time   Hypertension    Wears glasses     Past Surgical History:  Procedure Laterality Date   BIOPSY  05/04/2022   Procedure: BIOPSY;  Surgeon: Lemar Lofty., MD;  Location: [facility];  Service: Gastroenterology;;   Thressa Sheller STUDY  10/01/2018   Procedure: BUBBLE STUDY;  Surgeon: Pricilla Riffle, MD;  Location: [facility] END[facility];  Service: Cardiovascular;;   COLONOSCOPY  10/25/2007   [facility]. Dr Claudine Mouton. Polyp(s) of the right colon Minimal Hemorrhoidal disease.   CYSTOSCOPY N/A 11/05/2016   Procedure: CYSTOSCOPY;  Surgeon: Rene Paci, MD;  Location: [facility];  Service: Urology;  Laterality: N/A;   CYSTOSCOPY WITH BIOPSY Bilateral 10/24/2016   Procedure: CYSTOSCOPY WITH BLADDER BIOPSY/ BILATERAL RETROGRADE [id number];  Surgeon: Rene Paci, MD;  Location: [facility];  Service: Urology;  Laterality: Bilateral;   CYSTOSCOPY WITH BIOPSY N/A 05/12/2018  Procedure: CYSTOSCOPY WITH BLADDER BIOPSY/ BILATERAL RETROGRADE PYELOGRAM BIOPSY;  Surgeon: Rene Paci, MD;  Location: [facility];  Service: Urology;  Laterality: N/A;   ESOPHAGOGASTRODUODENOSCOPY N/A 05/04/2022   Procedure: ESOPHAGOGASTRODUODENOSCOPY (EGD);  Surgeon: Lemar Lofty., MD;  Location: [facility];  Service: Gastroenterology;  Laterality: N/A;   EXCISION HAGLUND'S DEFORMITY WITH  ACHILLES TENDON REPAIR Bilateral left 03-25-2006;  right 07-07-2006   and Gastroc Slide   fattytumorremoved from back      FOREIGN BODY REMOVAL  05/04/2022   Procedure: FOREIGN BODY REMOVAL;  Surgeon: Meridee Score Netty Starring., MD;  Location: [facility];  Service: Gastroenterology;;   LOOP RECORDER INSERTION N/A 10/01/2018   Procedure: LOOP RECORDER INSERTION;  Surgeon: Regan Lemming, MD;  Location: [facility];  Service: Cardiovascular;  Laterality: N/A;   TEE WITHOUT CARDIOVERSION N/A 10/01/2018   Procedure: TRANSESOPHAGEAL ECHOCARDIOGRAM (TEE);  Surgeon: Pricilla Riffle, MD;  Location: [facility] ENDOSCOPY;  Service: Cardiovascular;  Laterality: N/A;   TRANSURETHRAL RESECTION OF BLADDER TUMOR N/A 11/05/2016   Procedure: TRANSURETHRAL RESECTION OF BLADDER TUMOR (TURBT);  Surgeon: Rene Paci, MD;  Location: [facility];  Service: Urology;  Laterality: N/A;    Family History  Problem Relation Age of Onset   Blindness Mother    Cataracts Mother    Diabetes Mother    Glaucoma Mother    Macular degeneration Mother    Cataracts Father    Diabetes Father    Diabetes Sister    Diabetes Brother    Blindness Maternal Uncle    Macular degeneration Maternal Uncle    Macular degeneration Paternal Aunt    Amblyopia Neg Hx    Retinal detachment Neg Hx    Strabismus Neg Hx    Retinitis pigmentosa Neg Hx     Social History   Tobacco Use   Smoking status: Former    Years: 28    Types: Cigarettes    Quit date: 10/22/1999    Years since quitting: 22.8    Passive exposure: Never   Smokeless tobacco: Never   Tobacco comments:    Former smoker 11/22/2020  Vaping Use   Vaping Use: Never used  Substance Use Topics   Alcohol use: Yes    Alcohol/week: 5.0 - 6.0 standard drinks of alcohol    Types: 5 - 6 Cans of beer per week    Comment: 5-6 beers in a sitting 05/30/21   Drug use: No    Current Outpatient Medications  Medication Sig Dispense Refill    acetaminophen (TYLENOL) 500 MG tablet Take 500 mg by mouth every 6 (six) hours as needed for moderate pain.     DULoxetine (CYMBALTA) 30 MG capsule Take 30 mg by mouth daily.     fluticasone (FLONASE) 50 MCG/ACT nasal spray Place 1 spray into both nostrils as needed for allergies or rhinitis.     lisinopril (PRINIVIL,ZESTRIL) 40 MG tablet Take 40 mg by mouth every evening.      montelukast (SINGULAIR) 10 MG tablet Take 10 mg by mouth daily as needed (allergies).   6   oxybutynin (DITROPAN-XL) 5 MG 24 hr tablet Take 5 mg by mouth daily.     pantoprazole (PROTONIX) 40 MG tablet Take 1 tablet (40 mg total) by mouth 2 (two) times daily before a meal. 60 tablet 12   REPATHA SURECLICK 140 MG/ML SOAJ Inject as directed every 14 (fourteen) days.     tamsulosin (FLOMAX) 0.4 MG CAPS capsule Take 1 capsule (0.4 mg total) by  mouth daily. 30 capsule 11   apixaban (ELIQUIS) 5 MG TABS tablet Take 1 tablet (5 mg total) by mouth 2 (two) times daily. 60 tablet 11   diphenhydrAMINE (BENADRYL) 25 mg capsule Take 25 mg by mouth every 6 (six) hours as needed for allergies.     Melatonin 5 MG TABS Take 5 mg by mouth at bedtime.     sildenafil (VIAGRA) 100 MG tablet Take 100 mg by mouth daily as needed for erectile dysfunction.     sucralfate (CARAFATE) 1 GM/10ML suspension Take 10 mLs (1 g total) by mouth 2 (two) times daily. 420 mL 1   Current Facility-Administered Medications  Medication Dose Route Frequency Provider Last Rate Last Admin   0.9 %  sodium chloride infusion  500 mL Intravenous Continuous Lynann Bologna, MD        Allergies  Allergen Reactions   Statins Hives and Rash    whelps,liver enzymes elevated   Iohexol Other (See Comments)    Pt had sneezing after his CT with IV contrast, lasting only 10- 15 minutes, no intervention was needed, per radiologist    Review of Systems:  neg Psychiatric/Behavioral: H/O anxiety or depression     Physical Exam:    BP 115/72   Pulse 86   Temp 98.9 F  (37.2 C)   Resp (!) 23   Ht 5\' 11"  (1.803 m)   Wt 187 lb (84.8 kg)   SpO2 94%   BMI 26.08 kg/m  Wt Readings from Last 3 Encounters:  08/08/22 187 lb (84.8 kg)  07/31/22 187 lb (84.8 kg)  06/17/22 187 lb (84.8 kg)   Constitutional:  Well-developed, in no acute distress. Psychiatric: Normal mood and affect. Behavior is normal. HEENT: Pupils normal.  Conjunctivae are normal. No scleral icterus. Cardiovascular: Normal rate, regular rhythm. No edema.  Has implantable rhythm monitor. Pulmonary/chest: Effort normal and breath sounds normal. No wheezing, rales or rhonchi. Abdominal: Soft, nondistended. Nontender. Bowel sounds active throughout. There are no masses palpable. No hepatomegaly. Rectal: Deferred Neurological: Alert and oriented to person place and time. Skin: Skin is warm and dry. No rashes noted.  Data Reviewed: I have personally reviewed following labs and imaging studies  CBC:    Latest Ref Rng & Units 05/04/2022    6:27 PM 05/30/2021    1:42 PM 05/23/2020   11:30 AM  CBC  WBC 4.0 - 10.5 K/uL 8.4  5.9  6.0   Hemoglobin 13.0 - 17.0 g/dL 78.2  95.6  21.3   Hematocrit 39.0 - 52.0 % 47.3  46.7  50.1   Platelets 150 - 400 K/uL 179  178  206     CMP:    Latest Ref Rng & Units 05/04/2022    6:27 PM 05/30/2021    1:42 PM 05/23/2020   11:30 AM  CMP  Glucose 70 - 99 mg/dL 086  578  91   BUN 8 - 23 mg/dL 14  17  14    Creatinine 0.61 - 1.24 mg/dL 4.69  6.29  5.28   Sodium 135 - 145 mmol/L 137  138  139   Potassium 3.5 - 5.1 mmol/L 3.7  4.4  4.1   Chloride 98 - 111 mmol/L 105  109  106   CO2 22 - 32 mmol/L 21  22  28    Calcium 8.9 - 10.3 mg/dL 9.1  8.9  9.2       Radiology Studies: CUP PACEART REMOTE DEVICE CHECK  Result Date: 08/04/2022 ILR summary report  received. Battery status OK. Normal device function. No new symptom, tachy, brady, or pause episodes. No new AF episodes. Monthly summary reports and ROV/PRN. [facility], [facility].     Edman Circle, MD 08/08/2022, 5:04 PM  Cc:  Adrian Prince, MD

## 2022-08-08 NOTE — Progress Notes (Signed)
Vss nad trans to pacu 

## 2022-08-08 NOTE — Patient Instructions (Signed)
Resume Eliquis 08/09/22 in the evening. Reduce Protonix to 40mg  daily.  Nothing by mouth until 12:00 pm, clear liquids until 1:00 pm, soft foods for the rest of today.  Chew foods well and eat slowly.        YOU HAD AN ENDOSCOPIC PROCEDURE TODAY AT THE [facility]:   Refer to the procedure report that was given to you for any specific questions about what was found during the examination.  If the procedure report does not answer your questions, please call your gastroenterologist to clarify.  If you requested that your care partner not be given the details of your procedure findings, then the procedure report has been included in a sealed envelope for you to review at your convenience later.  YOU SHOULD EXPECT: Some feelings of bloating in the abdomen. Passage of more gas than usual.  Walking can help get rid of the air that was put into your GI tract during the procedure and reduce the bloating. If you had a lower endoscopy (such as a colonoscopy or flexible sigmoidoscopy) you may notice spotting of blood in your stool or on the toilet paper. If you underwent a bowel prep for your procedure, you may not have a normal bowel movement for a few days.  Please Note:  You might notice some irritation and congestion in your nose or some drainage.  This is from the oxygen used during your procedure.  There is no need for concern and it should clear up in a day or so.  SYMPTOMS TO REPORT IMMEDIATELY:   Following upper endoscopy (EGD)  Vomiting of blood or coffee ground material  New chest pain or pain under the shoulder blades  Painful or persistently difficult swallowing  New shortness of breath  Fever of 100F or higher  Black, tarry-looking stools  For urgent or emergent issues, a gastroenterologist can be reached at any hour by calling [phone]. Do not use MyChart messaging for urgent concerns.    DIET:  Nothing by mouth until 12:00pm, , clear liquids until 1:00 pm, soft  foods for the rest of today. Tomorrow you may proceed to your regular diet.  Drink plenty of fluids but you should avoid alcoholic beverages for 24 hours.  ACTIVITY:  You should plan to take it easy for the rest of today and you should NOT DRIVE or use heavy machinery until tomorrow (because of the sedation medicines used during the test).    FOLLOW UP: Our staff will call the number listed on your records the next business day following your procedure.  We will call around 7:15- 8:00 am to check on you and address any questions or concerns that you may have regarding the information given to you following your procedure. If we do not reach you, we will leave a message.     If any biopsies were taken you will be contacted by phone or by letter within the next 1-3 weeks.  Please call us at [phone] if you have not heard about the biopsies in 3 weeks.    SIGNATURES/CONFIDENTIALITY: You and/or your care partner have signed paperwork which will be entered into your electronic medical record.  These signatures attest to the fact that that the information above on your After Visit Summary has been reviewed and is understood.  Full responsibility of the confidentiality of this discharge information lies with you and/or your care-partner.

## 2022-08-08 NOTE — Progress Notes (Signed)
Called to room to assist during endoscopic procedure.  Patient ID and intended procedure confirmed with present staff. Received instructions for my participation in the procedure from the performing physician.  

## 2022-08-11 ENCOUNTER — Telehealth: Payer: Self-pay

## 2022-08-11 NOTE — Telephone Encounter (Signed)
No answer, left message to call if having any issues or concerns, B.Charde Macfarlane RN 

## 2022-08-13 LAB — SURGICAL PATHOLOGY

## 2022-08-15 DIAGNOSIS — C672 Malignant neoplasm of lateral wall of bladder: Secondary | ICD-10-CM | POA: Diagnosis not present

## 2022-08-22 NOTE — Progress Notes (Signed)
Carelink Summary Report / Loop Recorder 

## 2022-09-04 ENCOUNTER — Encounter: Payer: Self-pay | Admitting: Gastroenterology

## 2022-09-08 ENCOUNTER — Ambulatory Visit (INDEPENDENT_AMBULATORY_CARE_PROVIDER_SITE_OTHER): Payer: Medicare HMO

## 2022-09-08 DIAGNOSIS — I63411 Cerebral infarction due to embolism of right middle cerebral artery: Secondary | ICD-10-CM | POA: Diagnosis not present

## 2022-09-24 NOTE — Progress Notes (Signed)
Carelink Summary Report / Loop Recorder 

## 2022-11-10 ENCOUNTER — Ambulatory Visit (INDEPENDENT_AMBULATORY_CARE_PROVIDER_SITE_OTHER): Payer: Medicare HMO

## 2022-11-10 DIAGNOSIS — I63411 Cerebral infarction due to embolism of right middle cerebral artery: Secondary | ICD-10-CM

## 2022-11-11 DIAGNOSIS — I10 Essential (primary) hypertension: Secondary | ICD-10-CM | POA: Diagnosis not present

## 2022-11-11 DIAGNOSIS — Z008 Encounter for other general examination: Secondary | ICD-10-CM | POA: Diagnosis not present

## 2022-11-11 DIAGNOSIS — R7301 Impaired fasting glucose: Secondary | ICD-10-CM | POA: Diagnosis not present

## 2022-11-11 DIAGNOSIS — Z1212 Encounter for screening for malignant neoplasm of rectum: Secondary | ICD-10-CM | POA: Diagnosis not present

## 2022-11-11 LAB — CUP PACEART REMOTE DEVICE CHECK
Date Time Interrogation Session: 20240930231006
Implantable Pulse Generator Implant Date: 20200821

## 2022-11-18 DIAGNOSIS — I4892 Unspecified atrial flutter: Secondary | ICD-10-CM | POA: Diagnosis not present

## 2022-11-18 DIAGNOSIS — I6523 Occlusion and stenosis of bilateral carotid arteries: Secondary | ICD-10-CM | POA: Diagnosis not present

## 2022-11-18 DIAGNOSIS — C679 Malignant neoplasm of bladder, unspecified: Secondary | ICD-10-CM | POA: Diagnosis not present

## 2022-11-18 DIAGNOSIS — Z1339 Encounter for screening examination for other mental health and behavioral disorders: Secondary | ICD-10-CM | POA: Diagnosis not present

## 2022-11-18 DIAGNOSIS — R82998 Other abnormal findings in urine: Secondary | ICD-10-CM | POA: Diagnosis not present

## 2022-11-18 DIAGNOSIS — Z23 Encounter for immunization: Secondary | ICD-10-CM | POA: Diagnosis not present

## 2022-11-18 DIAGNOSIS — E785 Hyperlipidemia, unspecified: Secondary | ICD-10-CM | POA: Diagnosis not present

## 2022-11-18 DIAGNOSIS — Z1331 Encounter for screening for depression: Secondary | ICD-10-CM | POA: Diagnosis not present

## 2022-11-18 DIAGNOSIS — D126 Benign neoplasm of colon, unspecified: Secondary | ICD-10-CM | POA: Diagnosis not present

## 2022-11-18 DIAGNOSIS — I10 Essential (primary) hypertension: Secondary | ICD-10-CM | POA: Diagnosis not present

## 2022-11-18 DIAGNOSIS — I700 Atherosclerosis of aorta: Secondary | ICD-10-CM | POA: Diagnosis not present

## 2022-11-18 DIAGNOSIS — K219 Gastro-esophageal reflux disease without esophagitis: Secondary | ICD-10-CM | POA: Diagnosis not present

## 2022-11-18 DIAGNOSIS — Z0000 Encounter for general adult medical examination without abnormal findings: Secondary | ICD-10-CM | POA: Diagnosis not present

## 2022-11-24 NOTE — Progress Notes (Signed)
Carelink Summary Report / Loop Recorder 

## 2022-12-09 DIAGNOSIS — H524 Presbyopia: Secondary | ICD-10-CM | POA: Diagnosis not present

## 2022-12-09 DIAGNOSIS — H2513 Age-related nuclear cataract, bilateral: Secondary | ICD-10-CM | POA: Diagnosis not present

## 2022-12-09 DIAGNOSIS — H3563 Retinal hemorrhage, bilateral: Secondary | ICD-10-CM | POA: Diagnosis not present

## 2022-12-09 DIAGNOSIS — H18593 Other hereditary corneal dystrophies, bilateral: Secondary | ICD-10-CM | POA: Diagnosis not present

## 2022-12-15 ENCOUNTER — Ambulatory Visit (INDEPENDENT_AMBULATORY_CARE_PROVIDER_SITE_OTHER): Payer: Medicare HMO

## 2022-12-15 DIAGNOSIS — I63411 Cerebral infarction due to embolism of right middle cerebral artery: Secondary | ICD-10-CM

## 2022-12-15 LAB — CUP PACEART REMOTE DEVICE CHECK
Date Time Interrogation Session: 20241102230030
Implantable Pulse Generator Implant Date: 20200821

## 2023-01-01 ENCOUNTER — Telehealth: Payer: Self-pay

## 2023-01-01 NOTE — Telephone Encounter (Signed)
Pt called in spoke with scheduler saying he didn't understand why we are still remote monitoring his ILR. I have called pt to get and understanding as to why and LVM for pt to call back. I have canceled all remotes for now until I hear back from patient

## 2023-01-05 NOTE — Progress Notes (Signed)
Carelink Summary Report / Loop Recorder 

## 2023-01-19 ENCOUNTER — Ambulatory Visit: Payer: Medicare HMO

## 2023-01-19 DIAGNOSIS — I63411 Cerebral infarction due to embolism of right middle cerebral artery: Secondary | ICD-10-CM

## 2023-01-22 LAB — CUP PACEART REMOTE DEVICE CHECK
Date Time Interrogation Session: 20241208230301
Implantable Pulse Generator Implant Date: 20200821

## 2023-02-16 ENCOUNTER — Encounter: Payer: Self-pay | Admitting: Gastroenterology

## 2023-02-26 DIAGNOSIS — R3915 Urgency of urination: Secondary | ICD-10-CM | POA: Diagnosis not present

## 2023-02-26 DIAGNOSIS — Z8551 Personal history of malignant neoplasm of bladder: Secondary | ICD-10-CM | POA: Diagnosis not present

## 2023-02-26 DIAGNOSIS — N401 Enlarged prostate with lower urinary tract symptoms: Secondary | ICD-10-CM | POA: Diagnosis not present

## 2023-03-06 ENCOUNTER — Emergency Department (HOSPITAL_COMMUNITY): Payer: HMO | Admitting: Anesthesiology

## 2023-03-06 ENCOUNTER — Other Ambulatory Visit: Payer: Self-pay

## 2023-03-06 ENCOUNTER — Encounter (HOSPITAL_COMMUNITY)
Admission: EM | Disposition: A | Discharge status: Home / Self Care | Payer: Self-pay | Source: Home / Self Care | Attending: Emergency Medicine

## 2023-03-06 ENCOUNTER — Encounter (HOSPITAL_COMMUNITY): Payer: Self-pay

## 2023-03-06 ENCOUNTER — Emergency Department (EMERGENCY_DEPARTMENT_HOSPITAL): Payer: HMO | Admitting: Anesthesiology

## 2023-03-06 ENCOUNTER — Ambulatory Visit (HOSPITAL_COMMUNITY)
Admission: EM | Admit: 2023-03-06 | Discharge: 2023-03-06 | Disposition: A | Discharge status: Home / Self Care | Payer: HMO | Attending: Gastroenterology | Admitting: Gastroenterology

## 2023-03-06 DIAGNOSIS — K2100 Gastro-esophageal reflux disease with esophagitis, without bleeding: Secondary | ICD-10-CM | POA: Diagnosis not present

## 2023-03-06 DIAGNOSIS — T18128A Food in esophagus causing other injury, initial encounter: Secondary | ICD-10-CM

## 2023-03-06 DIAGNOSIS — I10 Essential (primary) hypertension: Secondary | ICD-10-CM | POA: Diagnosis not present

## 2023-03-06 DIAGNOSIS — K31819 Angiodysplasia of stomach and duodenum without bleeding: Secondary | ICD-10-CM | POA: Insufficient documentation

## 2023-03-06 DIAGNOSIS — Z7901 Long term (current) use of anticoagulants: Secondary | ICD-10-CM | POA: Insufficient documentation

## 2023-03-06 DIAGNOSIS — R1310 Dysphagia, unspecified: Secondary | ICD-10-CM | POA: Diagnosis not present

## 2023-03-06 DIAGNOSIS — Z87891 Personal history of nicotine dependence: Secondary | ICD-10-CM | POA: Insufficient documentation

## 2023-03-06 DIAGNOSIS — W44F3XA Food entering into or through a natural orifice, initial encounter: Secondary | ICD-10-CM | POA: Diagnosis not present

## 2023-03-06 DIAGNOSIS — K2289 Other specified disease of esophagus: Secondary | ICD-10-CM | POA: Diagnosis not present

## 2023-03-06 DIAGNOSIS — R1319 Other dysphagia: Secondary | ICD-10-CM

## 2023-03-06 HISTORY — PX: FOREIGN BODY REMOVAL: SHX962

## 2023-03-06 HISTORY — PX: BIOPSY: SHX5522

## 2023-03-06 HISTORY — PX: ESOPHAGOGASTRODUODENOSCOPY: SHX5428

## 2023-03-06 SURGERY — EGD (ESOPHAGOGASTRODUODENOSCOPY)
Anesthesia: General

## 2023-03-06 MED ORDER — SUCRALFATE 1 GM/10ML PO SUSP: 1.0000 g | Freq: Three times a day (TID) | ORAL | 2 refills | Status: AC

## 2023-03-06 MED ORDER — ONDANSETRON HCL 4 MG/2ML IJ SOLN
INTRAMUSCULAR | Status: DC | PRN
  Administered 2023-03-06: 4 mg via INTRAVENOUS

## 2023-03-06 MED ORDER — PROPOFOL 10 MG/ML IV BOLUS
INTRAVENOUS | Status: AC
  Filled 2023-03-06: qty 20

## 2023-03-06 MED ORDER — GLUCAGON HCL RDNA (DIAGNOSTIC) 1 MG IJ SOLR
1.0000 mg | Freq: Once | INTRAMUSCULAR | Status: DC
  Filled 2023-03-06: qty 1

## 2023-03-06 MED ORDER — PANTOPRAZOLE SODIUM 40 MG PO TBEC: 40.0000 mg | DELAYED_RELEASE_TABLET | Freq: Two times a day (BID) | ORAL | 1 refills | Status: DC

## 2023-03-06 MED ORDER — LIDOCAINE HCL (CARDIAC) PF 100 MG/5ML IV SOSY
PREFILLED_SYRINGE | INTRAVENOUS | Status: DC | PRN
  Administered 2023-03-06: 40 mg via INTRAVENOUS

## 2023-03-06 MED ORDER — FENTANYL CITRATE (PF) 250 MCG/5ML IJ SOLN
INTRAMUSCULAR | Status: AC
  Filled 2023-03-06: qty 5

## 2023-03-06 MED ORDER — GLUCAGON HCL RDNA (DIAGNOSTIC) 1 MG IJ SOLR
1.0000 mg | Freq: Once | INTRAMUSCULAR | Status: AC
  Administered 2023-03-06: 1 mg via INTRAMUSCULAR

## 2023-03-06 MED ORDER — FENTANYL CITRATE (PF) 100 MCG/2ML IJ SOLN
INTRAMUSCULAR | Status: DC | PRN
  Administered 2023-03-06 (×2): 50 ug via INTRAVENOUS

## 2023-03-06 MED ORDER — SUCCINYLCHOLINE CHLORIDE 200 MG/10ML IV SOSY
PREFILLED_SYRINGE | INTRAVENOUS | Status: DC | PRN
  Administered 2023-03-06: 120 mg via INTRAVENOUS

## 2023-03-06 MED ORDER — PROPOFOL 500 MG/50ML IV EMUL
INTRAVENOUS | Status: AC
  Filled 2023-03-06: qty 50

## 2023-03-06 MED ORDER — SODIUM CHLORIDE 0.9 % IV SOLN: INTRAVENOUS | Status: DC

## 2023-03-06 NOTE — Discharge Instructions (Signed)
YOU HAD AN ENDOSCOPIC PROCEDURE TODAY: Refer to the procedure report and other information in the discharge instructions given to you for any specific questions about what was found during the examination. If this information does not answer your questions, please call [place] office at [phone] to clarify.   YOU SHOULD EXPECT: Some feelings of bloating in the abdomen. Passage of more gas than usual. Walking can help get rid of the air that was put into your GI tract during the procedure and reduce the bloating. If you had a lower endoscopy (such as a colonoscopy or flexible sigmoidoscopy) you may notice spotting of blood in your stool or on the toilet paper. Some abdominal soreness may be present for a day or two, also.  DIET: Your first meal following the procedure should be a light meal and then it is ok to progress to your normal diet. A half-sandwich or bowl of soup is an example of a good first meal. Heavy or fried foods are harder to digest and may make you feel nauseous or bloated. Drink plenty of fluids but you should avoid alcoholic beverages for 24 hours. If you had a esophageal dilation, please see attached instructions for diet.    ACTIVITY: Your care partner should take you home directly after the procedure. You should plan to take it easy, moving slowly for the rest of the day. You can resume normal activity the day after the procedure however YOU SHOULD NOT DRIVE, use power tools, machinery or perform tasks that involve climbing or major physical exertion for 24 hours (because of the sedation medicines used during the test).   SYMPTOMS TO REPORT IMMEDIATELY: A gastroenterologist can be reached at any hour. Please call [phone]  for any of the following symptoms:   Following upper endoscopy (EGD, EUS, ERCP, esophageal dilation) Vomiting of blood or coffee ground material  New, significant abdominal pain  New, significant chest pain or pain under the shoulder blades  Painful or  persistently difficult swallowing  New shortness of breath  Black, tarry-looking or red, bloody stools  FOLLOW UP:  If any biopsies were taken you will be contacted by phone or by letter within the next 1-3 weeks. Call [phone]  if you have not heard about the biopsies in 3 weeks.  Please also call with any specific questions about appointments or follow up tests.

## 2023-03-06 NOTE — H&P (View-Only) (Signed)
Attending physician's note   I have taken a history, reviewed the chart, and examined the patient. I performed a substantive portion of this encounter, including complete performance of at least one of the key components, in conjunction with the APP. I agree with the APP's note, impression, and recommendations with my edits.   73 year old male with medical history as outlined below, to include history of A-fib (on Eliquis), CVA, and GERD complicated by erosive esophagitis and peptic stricture, presents with acute esophageal food impaction.  Similar presentation in 04/2022.  Emergent EGD at that time with LA Grade D erosive esophagitis, 3 cm hiatal hernia, and food advanced into the stomach.  Esophageal biopsies with 12 eosinophils, more consistent with reflux rather than EOE.  Was also diagnosed with H. pylori gastritis and treated with quadruple therapy along with high-dose PPI and Carafate.  Repeat endoscopy in 07/31/2022 with lower esophageal presbyesophagus, esophageal biopsies with mild reflux changes only but no EOE, stenosis at 38 cm/GE junction dilated with 52 French Maloney dilator, 2 cm hiatal hernia, and otherwise normal-appearing stomach with biopsies negative for H. pylori confirming eradication.  Protonix was reduced to 40 mg daily.  Had been doing well, then acute recurrence of food impaction after eating overcooked hamburger yesterday evening.  No improvement with trial of soda and glucagon.  - Emergent EGD for food retrieval - Last dose of Eliquis was yesterday - Additional recommendations pending endoscopic findings  Shaun Ball [phone] office           Consultation  Referring Provider:  ED  Primary Care Physician:  Shaun Prince, MD Primary Gastroenterologist:  Dr. Chales Abrahams       Reason for Consultation:     Food impaction  LOS: 0 days          HPI:   Shaun Ball is a 73 y.o. male with past medical history significant for CVA (on  Eliquis), hypertension, bladder cancer, food impaction, presents for evaluation of food impaction.  Recently admitted in March for food impaction.  Today he presents with what he thinks is a food impaction.  Yesterday evening he over cooked hamburger and after eating it he felt like it has been stuck in his esophagus.  He is tolerating secretions but anything he tries to eat including an ice cream bar, cracker, or soda he instantly vomits back up.  He feels this is similar to his previous food impaction.  His last dose of Eliquis was 1/23 AM.  In Triage, EDP did a trial of soda and glucagon.  He instantly vomited soda back up.  GI was then consulted for consideration of EGD.   PREVIOUS GI WORKUP   EGD 08/08/2022 - Benign- appearing esophageal stenosis. Dilated. Biopsied.  - 2 cm hiatal hernia.  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  - No other gross lesions in the proximal esophagus and in the mid esophagus.  - Food in the distal esophagus. Removal was successful with careful movement of the foodstuffs into the stomach.  - LA Grade D erosive esophagitis with no bleeding was found in the distal esophagus after food impaction removal.  - Z- line irregular, 40 cm from the incisors.  - Biopsies were taken with a cold forceps for histology in the entire esophagus to rule out EOE/ LOE.  - 3 cm hiatal hernia.  - Erythematous mucosa in the stomach. Biopsied.  - No gross lesions in the duodenal bulb, in  the first portion of the duodenum and in the second portion of the duodenum.  Past Medical History:  Diagnosis Date   Arthritis    Bladder cancer (HCC)    CVA (cerebral vascular accident) (HCC) 09/2018   History of kidney stones 2012   passed / has renal cyst to evaluated at later time   Hypertension    Wears glasses     Surgical History:  He  has a past surgical history that includes Excision haglund's deformity with achilles tendon repair (Bilateral,  left 03-25-2006;  right 07-07-2006); Cystoscopy with biopsy (Bilateral, 10/24/2016); Transurethral resection of bladder tumor (N/A, 11/05/2016); Cystoscopy (N/A, 11/05/2016); fattytumorremoved from back ; Cystoscopy with biopsy (N/A, 05/12/2018); TEE without cardioversion (N/A, 10/01/2018); Bubble study (10/01/2018); LOOP RECORDER INSERTION (N/A, 10/01/2018); Colonoscopy (10/25/2007); Esophagogastroduodenoscopy (N/A, 05/04/2022); biopsy (05/04/2022); and Foreign Body Removal (05/04/2022). Family History:  His family history includes Blindness in his maternal uncle and mother; Cataracts in his father and mother; Diabetes in his brother, father, mother, and sister; Glaucoma in his mother; Macular degeneration in his maternal uncle, mother, and paternal aunt. Social History:   reports that he quit smoking about 23 years ago. His smoking use included cigarettes. He started smoking about 51 years ago. He has never been exposed to tobacco smoke. He has never used smokeless tobacco. He reports current alcohol use of about 5.0 - 6.0 standard drinks of alcohol per week. He reports that he does not use drugs.  Prior to Admission medications   Medication Sig Start Date End Date Taking? Authorizing Provider  acetaminophen (TYLENOL) 500 MG tablet Take 500 mg by mouth every 6 (six) hours as needed for moderate pain.    [provider]  apixaban (ELIQUIS) 5 MG TABS tablet Take 1 tablet (5 mg total) by mouth 2 (two) times daily. 05/05/22   Mansouraty, Netty Starring., MD  diphenhydrAMINE (BENADRYL) 25 mg capsule Take 25 mg by mouth every 6 (six) hours as needed for allergies.    [provider]  DULoxetine (CYMBALTA) 30 MG capsule Take 30 mg by mouth daily.    [provider]  fluticasone (FLONASE) 50 MCG/ACT nasal spray Place 1 spray into both nostrils as needed for allergies or rhinitis.    [provider]  lisinopril (PRINIVIL,ZESTRIL) 40 MG tablet Take 40 mg by mouth every evening.      [provider]  Melatonin 5 MG TABS Take 5 mg by mouth at bedtime.    [provider]  montelukast (SINGULAIR) 10 MG tablet Take 10 mg by mouth daily as needed (allergies).  07/14/17   [provider]  oxybutynin (DITROPAN-XL) 5 MG 24 hr tablet Take 5 mg by mouth daily. 05/18/22   [provider]  pantoprazole (PROTONIX) 40 MG tablet Take 1 tablet (40 mg total) by mouth 2 (two) times daily before a meal. 05/04/22   Mansouraty, Netty Starring., MD  REPATHA SURECLICK 140 MG/ML SOAJ Inject as directed every 14 (fourteen) days. 12/01/19   [provider]  sildenafil (VIAGRA) 100 MG tablet Take 100 mg by mouth daily as needed for erectile dysfunction.    [provider]  sucralfate (CARAFATE) 1 GM/10ML suspension Take 10 mLs (1 g total) by mouth 2 (two) times daily. 05/04/22 05/04/23  Mansouraty, Netty Starring., MD  tamsulosin (FLOMAX) 0.4 MG CAPS capsule Take 1 capsule (0.4 mg total) by mouth daily. 10/24/16   Rene Paci, MD    Current Facility-Administered Medications  Medication Dose Route Frequency Provider Last Rate Last  Admin   0.9 %  sodium chloride infusion  500 mL Intravenous Continuous Lynann Bologna, MD       Current Outpatient Medications  Medication Sig Dispense Refill   acetaminophen (TYLENOL) 500 MG tablet Take 500 mg by mouth every 6 (six) hours as needed for moderate pain.     apixaban (ELIQUIS) 5 MG TABS tablet Take 1 tablet (5 mg total) by mouth 2 (two) times daily. 60 tablet 11   diphenhydrAMINE (BENADRYL) 25 mg capsule Take 25 mg by mouth every 6 (six) hours as needed for allergies.     DULoxetine (CYMBALTA) 30 MG capsule Take 30 mg by mouth daily.     fluticasone (FLONASE) 50 MCG/ACT nasal spray Place 1 spray into both nostrils as needed for allergies or rhinitis.     lisinopril (PRINIVIL,ZESTRIL) 40 MG tablet Take 40 mg by mouth every evening.      Melatonin 5 MG TABS Take 5 mg by mouth at bedtime.     montelukast  (SINGULAIR) 10 MG tablet Take 10 mg by mouth daily as needed (allergies).   6   oxybutynin (DITROPAN-XL) 5 MG 24 hr tablet Take 5 mg by mouth daily.     pantoprazole (PROTONIX) 40 MG tablet Take 1 tablet (40 mg total) by mouth 2 (two) times daily before a meal. 60 tablet 12   REPATHA SURECLICK 140 MG/ML SOAJ Inject as directed every 14 (fourteen) days.     sildenafil (VIAGRA) 100 MG tablet Take 100 mg by mouth daily as needed for erectile dysfunction.     sucralfate (CARAFATE) 1 GM/10ML suspension Take 10 mLs (1 g total) by mouth 2 (two) times daily. 420 mL 1   tamsulosin (FLOMAX) 0.4 MG CAPS capsule Take 1 capsule (0.4 mg total) by mouth daily. 30 capsule 11    Allergies as of 03/06/2023 - Review Complete 03/06/2023  Allergen Reaction Noted   Statins Hives and Rash 10/21/2016   Iohexol Other (See Comments) 09/29/2018    Review of Systems  Constitutional:  Negative for chills and fever.  HENT:  Negative for hearing loss and tinnitus.   Eyes:  Negative for blurred vision and double vision.  Respiratory:  Negative for cough and hemoptysis.   Cardiovascular:  Negative for chest pain and palpitations.  Gastrointestinal:  Positive for nausea and vomiting. Negative for abdominal pain, blood in stool, constipation, diarrhea, heartburn and melena.  Genitourinary:  Negative for dysuria and urgency.  Musculoskeletal:  Negative for myalgias and neck pain.  Skin:  Negative for itching and rash.  Neurological:  Negative for dizziness and headaches.  Psychiatric/Behavioral:  Negative for depression and suicidal ideas.        Physical Exam:  Vital signs in last 24 hours: Temp:  [97.7 F (36.5 C)] 97.7 F (36.5 C) (01/24 1037) Pulse Rate:  [69-74] 69 (01/24 1230) Resp:  [16] 16 (01/24 1230) BP: (140-152)/(76) 140/76 (01/24 1230) SpO2:  [99 %] 99 % (01/24 1230)   Last BM recorded by nurses in past 5 days No data recorded  Physical Exam Constitutional:      Appearance: Normal appearance.   HENT:     Head: Normocephalic and atraumatic.     Nose: Nose normal. No congestion.     Mouth/Throat:     Mouth: Mucous membranes are moist.     Pharynx: Oropharynx is clear.  Eyes:     Extraocular Movements: Extraocular movements intact.     Conjunctiva/sclera: Conjunctivae normal.  Cardiovascular:     Rate and Rhythm:  Normal rate and regular rhythm.  Pulmonary:     Effort: Pulmonary effort is normal. No respiratory distress.  Abdominal:     General: Bowel sounds are normal. There is no distension.     Palpations: Abdomen is soft. There is no mass.     Tenderness: There is no abdominal tenderness. There is no guarding or rebound.     Hernia: No hernia is present.  Musculoskeletal:        General: No swelling. Normal range of motion.     Cervical back: Normal range of motion. No rigidity.  Skin:    General: Skin is warm and dry.  Neurological:     General: No focal deficit present.     Mental Status: He is alert.  Psychiatric:        Mood and Affect: Mood normal.        Behavior: Behavior normal.        Thought Content: Thought content normal.        Judgment: Judgment normal.      LAB RESULTS: No results for input(s): "WBC", "HGB", "HCT", "PLT" in the last 72 hours. BMET No results for input(s): "NA", "K", "CL", "CO2", "GLUCOSE", "BUN", "CREATININE", "CALCIUM" in the last 72 hours. LFT No results for input(s): "PROT", "ALBUMIN", "AST", "ALT", "ALKPHOS", "BILITOT", "BILIDIR", "IBILI" in the last 72 hours. PT/INR No results for input(s): "LABPROT", "INR" in the last 72 hours.  STUDIES: No results found.    Impression    Food impaction No improvement after glucagon/soda.  Tolerating secretions.  Unable to tolerate food/liquid.  Previous history of food impaction March 2024   Plan   Plan for EGD today. I thoroughly discussed the procedures to include nature, alternatives, benefits, and risks including but not limited to bleeding, perforation, infection,  anesthesia/cardiac and pulmonary complications. Patient provides understanding and gave verbal consent to proceed. NPO PPI BID    Thank you for your kind consultation, we will continue to follow.   Bayley Leanna Sato  03/06/2023, 12:45 PM

## 2023-03-06 NOTE — Transfer of Care (Signed)
Immediate Anesthesia Transfer of Care Note  Patient: Shaun Ball  Procedure(s) Performed: ESOPHAGOGASTRODUODENOSCOPY (EGD) BIOPSY FOREIGN BODY REMOVAL  Patient Location: PACU  Anesthesia Type:General  Level of Consciousness: awake  Airway & Oxygen Therapy: Patient Spontanous Breathing and Patient connected to face mask oxygen  Post-op Assessment: Report given to RN and Post -op Vital signs reviewed and stable  Post vital signs: Reviewed and stable  Last Vitals:  Vitals Value Taken Time  BP 163/63 03/06/23 1455  Temp 36.8 C 03/06/23 1455  Pulse 83 03/06/23 1456  Resp 12 03/06/23 1456  SpO2 100 % 03/06/23 1456  Vitals shown include unfiled device data.  Last Pain:  Vitals:   03/06/23 1455  TempSrc: Temporal  PainSc: 0-No pain         Complications: No notable events documented.

## 2023-03-06 NOTE — ED Provider Notes (Signed)
[place] EMERGENCY DEPARTMENT AT [facility] Provider Note   CSN: [id number] Arrival date & time: 03/06/23  1030     History  No chief complaint on file.   Shaun Ball is a 73 y.o. male.  The history is provided by the patient and medical records. No language interpreter was used.  Illness Location:  Hamburger stuck in throat Severity:  Moderate Onset quality:  Sudden Duration:  1 day Timing:  Constant Progression:  Unchanged Chronicity:  Recurrent Context:  Choked on hamburger last night and still feels like it is stuck in the distal throat Associated symptoms: vomiting   Associated symptoms: no abdominal pain, no chest pain, no congestion, no cough, no diarrhea, no fatigue, no fever, no headaches, no nausea, no shortness of breath and no wheezing        Home Medications Prior to Admission medications   Medication Sig Start Date End Date Taking? Authorizing Provider  acetaminophen (TYLENOL) 500 MG tablet Take 500 mg by mouth every 6 (six) hours as needed for moderate pain.    [provider]  apixaban (ELIQUIS) 5 MG TABS tablet Take 1 tablet (5 mg total) by mouth 2 (two) times daily. 05/05/22   Mansouraty, Netty Starring., MD  diphenhydrAMINE (BENADRYL) 25 mg capsule Take 25 mg by mouth every 6 (six) hours as needed for allergies.    [provider]  DULoxetine (CYMBALTA) 30 MG capsule Take 30 mg by mouth daily.    [provider]  fluticasone (FLONASE) 50 MCG/ACT nasal spray Place 1 spray into both nostrils as needed for allergies or rhinitis.    [provider]  lisinopril (PRINIVIL,ZESTRIL) 40 MG tablet Take 40 mg by mouth every evening.     [provider]  Melatonin 5 MG TABS Take 5 mg by mouth at bedtime.    [provider]  montelukast (SINGULAIR) 10 MG tablet Take 10 mg by mouth daily as needed (allergies).  07/14/17   [provider]  oxybutynin (DITROPAN-XL) 5 MG 24 hr tablet Take 5 mg by  mouth daily. 05/18/22   [provider]  pantoprazole (PROTONIX) 40 MG tablet Take 1 tablet (40 mg total) by mouth 2 (two) times daily before a meal. 05/04/22   Mansouraty, Netty Starring., MD  REPATHA SURECLICK 140 MG/ML SOAJ Inject as directed every 14 (fourteen) days. 12/01/19   [provider]  sildenafil (VIAGRA) 100 MG tablet Take 100 mg by mouth daily as needed for erectile dysfunction.    [provider]  sucralfate (CARAFATE) 1 GM/10ML suspension Take 10 mLs (1 g total) by mouth 2 (two) times daily. 05/04/22 05/04/23  Mansouraty, Netty Starring., MD  tamsulosin (FLOMAX) 0.4 MG CAPS capsule Take 1 capsule (0.4 mg total) by mouth daily. 10/24/16   Rene Paci, MD      Allergies    Statins and Iohexol    Review of Systems   Review of Systems  Constitutional:  Negative for chills, fatigue and fever.  HENT:  Negative for congestion.   Respiratory:  Positive for choking. Negative for cough, chest tightness, shortness of breath and wheezing.   Cardiovascular:  Negative for chest pain.  Gastrointestinal:  Positive for vomiting. Negative for abdominal pain, constipation, diarrhea and nausea.  Genitourinary:  Negative for dysuria.  Neurological:  Negative for light-headedness and headaches.  Psychiatric/Behavioral:  Negative for agitation.   All other systems reviewed and are negative.   Physical Exam Updated Vital Signs BP (!) 152/76 (BP Location: Left  Arm)   Pulse 74   Temp 97.7 F (36.5 C) (Oral)   Resp 16   SpO2 99%  Physical Exam Vitals and nursing note reviewed.  Constitutional:      General: He is not in acute distress.    Appearance: He is well-developed. He is not ill-appearing, toxic-appearing or diaphoretic.  HENT:     Head: Normocephalic and atraumatic.     Mouth/Throat:     Mouth: Mucous membranes are moist.  Eyes:     Conjunctiva/sclera: Conjunctivae normal.  Cardiovascular:     Rate and Rhythm: Normal rate and regular rhythm.      Heart sounds: No murmur heard. Pulmonary:     Effort: Pulmonary effort is normal. No respiratory distress.     Breath sounds: Normal breath sounds. No stridor. No rhonchi or rales.  Chest:     Chest wall: No tenderness.  Abdominal:     General: Abdomen is flat.     Palpations: Abdomen is soft.     Tenderness: There is no abdominal tenderness.  Musculoskeletal:        General: No swelling or tenderness.     Cervical back: Neck supple. No tenderness.  Skin:    General: Skin is warm and dry.     Capillary Refill: Capillary refill takes less than 2 seconds.  Neurological:     Mental Status: He is alert.  Psychiatric:        Mood and Affect: Mood normal.     ED Results / Procedures / Treatments   Labs (all labs ordered are listed, but only abnormal results are displayed) Labs Reviewed - No data to display  EKG None  Radiology No results found.  Procedures Procedures    Medications Ordered in ED Medications  glucagon (human recombinant) (GLUCAGEN) injection 1 mg (1 mg Intramuscular Given 03/06/23 1100)    ED Course/ Medical Decision Making/ A&P                                 Medical Decision Making Risk Prescription drug management.    Shaun Ball is a 73 y.o. male with a past medical history significant for hypertension, previous stroke, kidney stones, bladder cancer, and previous food bolus impaction requiring intervention last year who presents with food stuck in throat.  According to patient, he had a hamburger last night that he cooked himself and overcooked it until it was too hard and after taking a bite did not chew well enough and then he got stuck in his throat.  He reports he tried drinking some water and crackers and that came up and he vomited.  He said this morning he still feels like it is stuck in his distal chest.  Did try drinking some more liquid and had a cracker but feels like it is still stuck in his chest and is not passing.  He has not yet  vomited up.  He is tolerating his own secretions at this time.  He denies other chest or abdominal pain.  Denies any preceding symptoms such as fevers, chills, congestion, cough, diarrhea, or urinary changes.  He does report some mild constipation.  He said he has done very well for the last year and has not had any other choking episodes.  He was seen by [facility] previously.  He called them and was told to come in for evaluation.  On exam, lungs clear.  Chest nontender.  Abdomen nontender.  I do not appreciate stridor and oropharyngeal exam otherwise unremarkable.  Neck nontender.  Patient otherwise resting and is tolerating secretions.  As patient still reports it feels like it is stuck and feels similar to when he had to have it removed last year, we will attempt to give him glucagon and some Coca-Cola to see if it will pass.  If it does not and he is still having the sensation that it is stuck, anticipate discussion with gastroenterology for evaluation.  Will hold on imaging at this time and he has no respiratory difficulty.  Of note, he was unable to take his Eliquis last night or this morning due to feeling that something is still stuck in his throat.  11:26 AM Unfortunately, patient vomited up the Coca-Cola after the glucagon and did not seem to pass the food bolus.  He still feels something in there.  Will call [facility] to discuss plan.  12:02 PM Spoke to GI and they will come see patient.  He will remain n.p.o. now.  GI will take to endoscopy suite, dissipate discharge if he comes back to the emergency department after resolution of suspected impaction.        Final Clinical Impression(s) / ED Diagnoses Final diagnoses:  Food impaction of esophagus, initial encounter     Clinical Impression: 1. Food impaction of esophagus, initial encounter     Disposition: To the endoscopy suite with GI.  Anticipate discharge after return to emergency department.  This note was  prepared with assistance of Conservation officer, historic buildings. Occasional wrong-word or sound-a-like substitutions may have occurred due to the inherent limitations of voice recognition software.     Dniya Neuhaus, Canary Brim, MD 03/06/23 1434

## 2023-03-06 NOTE — ED Triage Notes (Signed)
Pt here from home with c/o food stuck ( hamburger from last night ), pt is pointing to his lower chest ,has had same problem in the past and had to go to endo

## 2023-03-06 NOTE — ED Notes (Signed)
Pt tried to drink fluids and was only able to keep down half a cup before he threw up.

## 2023-03-06 NOTE — Consult Note (Addendum)
Attending physician's note   I have taken a history, reviewed the chart, and examined the patient. I performed a substantive portion of this encounter, including complete performance of at least one of the key components, in conjunction with the APP. I agree with the APP's note, impression, and recommendations with my edits.   73 year old male with medical history as outlined below, to include history of A-fib (on Eliquis), CVA, and GERD complicated by erosive esophagitis and peptic stricture, presents with acute esophageal food impaction.  Similar presentation in 04/2022.  Emergent EGD at that time with LA Grade D erosive esophagitis, 3 cm hiatal hernia, and food advanced into the stomach.  Esophageal biopsies with 12 eosinophils, more consistent with reflux rather than EOE.  Was also diagnosed with H. pylori gastritis and treated with quadruple therapy along with high-dose PPI and Carafate.  Repeat endoscopy in 07/31/2022 with lower esophageal presbyesophagus, esophageal biopsies with mild reflux changes only but no EOE, stenosis at 38 cm/GE junction dilated with 52 French Maloney dilator, 2 cm hiatal hernia, and otherwise normal-appearing stomach with biopsies negative for H. pylori confirming eradication.  Protonix was reduced to 40 mg daily.  Had been doing well, then acute recurrence of food impaction after eating overcooked hamburger yesterday evening.  No improvement with trial of soda and glucagon.  - Emergent EGD for food retrieval - Last dose of Eliquis was yesterday - Additional recommendations pending endoscopic findings  Shaun Ball [phone] office           Consultation  Referring Provider:  ED  Primary Care Physician:  Adrian Prince, MD Primary Gastroenterologist:  Dr. Chales Abrahams       Reason for Consultation:     Food impaction  LOS: 0 days          HPI:   Shaun Ball is a 73 y.o. male with past medical history significant for CVA (on  Eliquis), hypertension, bladder cancer, food impaction, presents for evaluation of food impaction.  Recently admitted in March for food impaction.  Today he presents with what he thinks is a food impaction.  Yesterday evening he over cooked hamburger and after eating it he felt like it has been stuck in his esophagus.  He is tolerating secretions but anything he tries to eat including an ice cream bar, cracker, or soda he instantly vomits back up.  He feels this is similar to his previous food impaction.  His last dose of Eliquis was 1/23 AM.  In Triage, EDP did a trial of soda and glucagon.  He instantly vomited soda back up.  GI was then consulted for consideration of EGD.   PREVIOUS GI WORKUP   EGD 08/08/2022 - Benign- appearing esophageal stenosis. Dilated. Biopsied.  - 2 cm hiatal hernia.  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  - No other gross lesions in the proximal esophagus and in the mid esophagus.  - Food in the distal esophagus. Removal was successful with careful movement of the foodstuffs into the stomach.  - LA Grade D erosive esophagitis with no bleeding was found in the distal esophagus after food impaction removal.  - Z- line irregular, 40 cm from the incisors.  - Biopsies were taken with a cold forceps for histology in the entire esophagus to rule out EOE/ LOE.  - 3 cm hiatal hernia.  - Erythematous mucosa in the stomach. Biopsied.  - No gross lesions in the duodenal bulb, in  the first portion of the duodenum and in the second portion of the duodenum.  Past Medical History:  Diagnosis Date   Arthritis    Bladder cancer (HCC)    CVA (cerebral vascular accident) (HCC) 09/2018   History of kidney stones 2012   passed / has renal cyst to evaluated at later time   Hypertension    Wears glasses     Surgical History:  He  has a past surgical history that includes Excision haglund's deformity with achilles tendon repair (Bilateral,  left 03-25-2006;  right 07-07-2006); Cystoscopy with biopsy (Bilateral, 10/24/2016); Transurethral resection of bladder tumor (N/A, 11/05/2016); Cystoscopy (N/A, 11/05/2016); fattytumorremoved from back ; Cystoscopy with biopsy (N/A, 05/12/2018); TEE without cardioversion (N/A, 10/01/2018); Bubble study (10/01/2018); LOOP RECORDER INSERTION (N/A, 10/01/2018); Colonoscopy (10/25/2007); Esophagogastroduodenoscopy (N/A, 05/04/2022); biopsy (05/04/2022); and Foreign Body Removal (05/04/2022). Family History:  His family history includes Blindness in his maternal uncle and mother; Cataracts in his father and mother; Diabetes in his brother, father, mother, and sister; Glaucoma in his mother; Macular degeneration in his maternal uncle, mother, and paternal aunt. Social History:   reports that he quit smoking about 23 years ago. His smoking use included cigarettes. He started smoking about 51 years ago. He has never been exposed to tobacco smoke. He has never used smokeless tobacco. He reports current alcohol use of about 5.0 - 6.0 standard drinks of alcohol per week. He reports that he does not use drugs.  Prior to Admission medications   Medication Sig Start Date End Date Taking? Authorizing Provider  acetaminophen (TYLENOL) 500 MG tablet Take 500 mg by mouth every 6 (six) hours as needed for moderate pain.    [provider]  apixaban (ELIQUIS) 5 MG TABS tablet Take 1 tablet (5 mg total) by mouth 2 (two) times daily. 05/05/22   Mansouraty, Netty Starring., MD  diphenhydrAMINE (BENADRYL) 25 mg capsule Take 25 mg by mouth every 6 (six) hours as needed for allergies.    [provider]  DULoxetine (CYMBALTA) 30 MG capsule Take 30 mg by mouth daily.    [provider]  fluticasone (FLONASE) 50 MCG/ACT nasal spray Place 1 spray into both nostrils as needed for allergies or rhinitis.    [provider]  lisinopril (PRINIVIL,ZESTRIL) 40 MG tablet Take 40 mg by mouth every evening.      [provider]  Melatonin 5 MG TABS Take 5 mg by mouth at bedtime.    [provider]  montelukast (SINGULAIR) 10 MG tablet Take 10 mg by mouth daily as needed (allergies).  07/14/17   [provider]  oxybutynin (DITROPAN-XL) 5 MG 24 hr tablet Take 5 mg by mouth daily. 05/18/22   [provider]  pantoprazole (PROTONIX) 40 MG tablet Take 1 tablet (40 mg total) by mouth 2 (two) times daily before a meal. 05/04/22   Mansouraty, Netty Starring., MD  REPATHA SURECLICK 140 MG/ML SOAJ Inject as directed every 14 (fourteen) days. 12/01/19   [provider]  sildenafil (VIAGRA) 100 MG tablet Take 100 mg by mouth daily as needed for erectile dysfunction.    [provider]  sucralfate (CARAFATE) 1 GM/10ML suspension Take 10 mLs (1 g total) by mouth 2 (two) times daily. 05/04/22 05/04/23  Mansouraty, Netty Starring., MD  tamsulosin (FLOMAX) 0.4 MG CAPS capsule Take 1 capsule (0.4 mg total) by mouth daily. 10/24/16   Rene Paci, MD    Current Facility-Administered Medications  Medication Dose Route Frequency Provider Last Rate Last  Admin   0.9 %  sodium chloride infusion  500 mL Intravenous Continuous Lynann Bologna, MD       Current Outpatient Medications  Medication Sig Dispense Refill   acetaminophen (TYLENOL) 500 MG tablet Take 500 mg by mouth every 6 (six) hours as needed for moderate pain.     apixaban (ELIQUIS) 5 MG TABS tablet Take 1 tablet (5 mg total) by mouth 2 (two) times daily. 60 tablet 11   diphenhydrAMINE (BENADRYL) 25 mg capsule Take 25 mg by mouth every 6 (six) hours as needed for allergies.     DULoxetine (CYMBALTA) 30 MG capsule Take 30 mg by mouth daily.     fluticasone (FLONASE) 50 MCG/ACT nasal spray Place 1 spray into both nostrils as needed for allergies or rhinitis.     lisinopril (PRINIVIL,ZESTRIL) 40 MG tablet Take 40 mg by mouth every evening.      Melatonin 5 MG TABS Take 5 mg by mouth at bedtime.     montelukast  (SINGULAIR) 10 MG tablet Take 10 mg by mouth daily as needed (allergies).   6   oxybutynin (DITROPAN-XL) 5 MG 24 hr tablet Take 5 mg by mouth daily.     pantoprazole (PROTONIX) 40 MG tablet Take 1 tablet (40 mg total) by mouth 2 (two) times daily before a meal. 60 tablet 12   REPATHA SURECLICK 140 MG/ML SOAJ Inject as directed every 14 (fourteen) days.     sildenafil (VIAGRA) 100 MG tablet Take 100 mg by mouth daily as needed for erectile dysfunction.     sucralfate (CARAFATE) 1 GM/10ML suspension Take 10 mLs (1 g total) by mouth 2 (two) times daily. 420 mL 1   tamsulosin (FLOMAX) 0.4 MG CAPS capsule Take 1 capsule (0.4 mg total) by mouth daily. 30 capsule 11    Allergies as of 03/06/2023 - Review Complete 03/06/2023  Allergen Reaction Noted   Statins Hives and Rash 10/21/2016   Iohexol Other (See Comments) 09/29/2018    Review of Systems  Constitutional:  Negative for chills and fever.  HENT:  Negative for hearing loss and tinnitus.   Eyes:  Negative for blurred vision and double vision.  Respiratory:  Negative for cough and hemoptysis.   Cardiovascular:  Negative for chest pain and palpitations.  Gastrointestinal:  Positive for nausea and vomiting. Negative for abdominal pain, blood in stool, constipation, diarrhea, heartburn and melena.  Genitourinary:  Negative for dysuria and urgency.  Musculoskeletal:  Negative for myalgias and neck pain.  Skin:  Negative for itching and rash.  Neurological:  Negative for dizziness and headaches.  Psychiatric/Behavioral:  Negative for depression and suicidal ideas.        Physical Exam:  Vital signs in last 24 hours: Temp:  [97.7 F (36.5 C)] 97.7 F (36.5 C) (01/24 1037) Pulse Rate:  [69-74] 69 (01/24 1230) Resp:  [16] 16 (01/24 1230) BP: (140-152)/(76) 140/76 (01/24 1230) SpO2:  [99 %] 99 % (01/24 1230)   Last BM recorded by nurses in past 5 days No data recorded  Physical Exam Constitutional:      Appearance: Normal appearance.   HENT:     Head: Normocephalic and atraumatic.     Nose: Nose normal. No congestion.     Mouth/Throat:     Mouth: Mucous membranes are moist.     Pharynx: Oropharynx is clear.  Eyes:     Extraocular Movements: Extraocular movements intact.     Conjunctiva/sclera: Conjunctivae normal.  Cardiovascular:     Rate and Rhythm:  Normal rate and regular rhythm.  Pulmonary:     Effort: Pulmonary effort is normal. No respiratory distress.  Abdominal:     General: Bowel sounds are normal. There is no distension.     Palpations: Abdomen is soft. There is no mass.     Tenderness: There is no abdominal tenderness. There is no guarding or rebound.     Hernia: No hernia is present.  Musculoskeletal:        General: No swelling. Normal range of motion.     Cervical back: Normal range of motion. No rigidity.  Skin:    General: Skin is warm and dry.  Neurological:     General: No focal deficit present.     Mental Status: He is alert.  Psychiatric:        Mood and Affect: Mood normal.        Behavior: Behavior normal.        Thought Content: Thought content normal.        Judgment: Judgment normal.      LAB RESULTS: No results for input(s): "WBC", "HGB", "HCT", "PLT" in the last 72 hours. BMET No results for input(s): "NA", "K", "CL", "CO2", "GLUCOSE", "BUN", "CREATININE", "CALCIUM" in the last 72 hours. LFT No results for input(s): "PROT", "ALBUMIN", "AST", "ALT", "ALKPHOS", "BILITOT", "BILIDIR", "IBILI" in the last 72 hours. PT/INR No results for input(s): "LABPROT", "INR" in the last 72 hours.  STUDIES: No results found.    Impression    Food impaction No improvement after glucagon/soda.  Tolerating secretions.  Unable to tolerate food/liquid.  Previous history of food impaction March 2024   Plan   Plan for EGD today. I thoroughly discussed the procedures to include nature, alternatives, benefits, and risks including but not limited to bleeding, perforation, infection,  anesthesia/cardiac and pulmonary complications. Patient provides understanding and gave verbal consent to proceed. NPO PPI BID    Thank you for your kind consultation, we will continue to follow.   Bayley Leanna Sato  03/06/2023, 12:45 PM

## 2023-03-06 NOTE — Interval H&P Note (Signed)
History and Physical Interval Note:  03/06/2023 1:49 PM  Shaun Ball  has presented today for surgery, with the diagnosis of food impaction.  The various methods of treatment have been discussed with the patient and family. After consideration of risks, benefits and other options for treatment, the patient has consented to  Procedure(s): ESOPHAGOGASTRODUODENOSCOPY (EGD) (N/A) as a surgical intervention.  The patient's history has been reviewed, patient examined, no change in status, stable for surgery.  I have reviewed the patient's chart and labs.  Questions were answered to the patient's satisfaction.     Verlin Dike Carletta Feasel

## 2023-03-06 NOTE — Anesthesia Preprocedure Evaluation (Addendum)
Anesthesia Evaluation  Patient identified by MRN, date of birth, ID band Patient awake    Reviewed: Allergy & Precautions, NPO status , Patient's Chart, lab work & pertinent test results  Airway Mallampati: II  TM Distance: >3 FB Neck ROM: Full    Dental  (+) Teeth Intact, Dental Advisory Given   Pulmonary former smoker   breath sounds clear to auscultation       Cardiovascular hypertension,  Rhythm:Regular Rate:Normal     Neuro/Psych  PSYCHIATRIC DISORDERS      CVA    GI/Hepatic negative GI ROS, Neg liver ROS,,,  Endo/Other  negative endocrine ROS    Renal/GU negative Renal ROS     Musculoskeletal  (+) Arthritis ,    Abdominal   Peds  Hematology negative hematology ROS (+)   Anesthesia Other Findings   Reproductive/Obstetrics                             Anesthesia Physical Anesthesia Plan  ASA: 3  Anesthesia Plan: General   Post-op Pain Management:    Induction: Intravenous, Rapid sequence and Cricoid pressure planned  PONV Risk Score and Plan: 1 and Ondansetron and Treatment may vary due to age or medical condition  Airway Management Planned: Oral ETT  Additional Equipment: None  Intra-op Plan:   Post-operative Plan: Extubation in OR  Informed Consent: I have reviewed the patients History and Physical, chart, labs and discussed the procedure including the risks, benefits and alternatives for the proposed anesthesia with the patient or authorized representative who has indicated his/her understanding and acceptance.     Dental advisory given  Plan Discussed with: CRNA  Anesthesia Plan Comments:        Anesthesia Quick Evaluation

## 2023-03-06 NOTE — Op Note (Signed)
[facility] Patient Name: Shaun Ball Procedure Date: 03/06/2023 MRN: [id number] Attending MD: Doristine Locks , MD, [id number] Date of Birth: 06-Jul-1950 CSN: [id number] Age: 73 Admit Type: Outpatient Procedure:                Upper GI endoscopy Indications:              Dysphagia, Foreign body in the esophagus Providers:                Doristine Locks, MD, Lorenza Evangelist, RN, Rhodia Albright, Technician Referring MD:              Medicines:                Elective intubation, General Anesthesia Complications:            No immediate complications. Estimated Blood Loss:     Estimated blood loss: none. Procedure:                Pre-Anesthesia Assessment:                           - Prior to the procedure, a History and Physical                            was performed, and patient medications and                            allergies were reviewed. The patient's tolerance of                            previous anesthesia was also reviewed. The risks                            and benefits of the procedure and the sedation                            options and risks were discussed with the patient.                            All questions were answered, and informed consent                            was obtained. Prior Anticoagulants: The patient has                            taken Eliquis (apixaban), last dose was 1 day prior                            to procedure. ASA Grade Assessment: III - A patient                            with severe systemic disease. After reviewing the  risks and benefits, the patient was deemed in                            satisfactory condition to undergo the procedure.                           After obtaining informed consent, the endoscope was                            passed under direct vision. Throughout the                            procedure, the patient's blood pressure, pulse,  and                            oxygen saturations were monitored continuously. The                            [id number]-[id number] ([id number]) Olympus endoscope was introduced                            through the mouth, and advanced to the second part                            of duodenum. The upper GI endoscopy was                            accomplished without difficulty. The patient                            tolerated the procedure well. Scope In: Scope Out: Findings:      Food bolus was found in the lower third of the esophagus. Using full air       insufflation and gentle endoscopic pressure, this was carefully advanced       into the stomach.      Localized moderate erythema was found in the lower third of the       esophagus. This was localized to the area of the food impaction,       consistent with contact, erosive injury.      The upper third of the esophagus and middle third of the esophagus were       normal. Biopsies were obtained from the proximal and distal esophagus       with cold forceps for histology of suspected eosinophilic esophagitis.       Estimated blood loss was minimal.      The entire examined stomach was normal.      A single small angioectasia with typical arborization was found in the       second portion of the duodenum. The remainder of the small bowel was       normal appearing. Impression:               - Food in the lower third of the esophagus.                           - Erythema in the  lower third of the esophagus.                           - Normal upper third of esophagus and middle third                            of esophagus.                           - Normal stomach.                           - A single angioectasia in the duodenum.                           - Biopsies were taken with a cold forceps for                            evaluation of eosinophilic esophagitis. Moderate Sedation:      Not Applicable - Patient had care per  Anesthesia. Recommendation:           - Patient has a contact number available for                            emergencies. The signs and symptoms of potential                            delayed complications were discussed with the                            patient. Return to normal activities tomorrow.                            Written discharge instructions were provided to the                            patient.                           - Full liquid diet today. Can slowly advance to                            blander consistency diet tomorrow for 48 hours,                            then slowly advance to soft foods. Do not advance                            beyond soft foods until follow-up appointment in                            the GI clinic.                           - Await pathology results.                           -  Use Protonix (pantoprazole) 40 mg PO BID for 8                            weeks.                           - Return to GI clinic at appointment to be                            scheduled.                           - Use sucralfate suspension 1 gram PO QID for 2                            weeks, then BID for 2 weeks, then can discontinue.                           - OK to resume Eliquis tomorrow. Procedure Code(s):        --- Professional ---                           [phone], Esophagogastroduodenoscopy, flexible,                            transoral; with biopsy, single or multiple Diagnosis Code(s):        --- Professional ---                           [phone], Food in esophagus causing other injury,                            initial encounter                           K22.89, Other specified disease of esophagus                           K31.819, Angiodysplasia of stomach and duodenum                            without bleeding                           R13.10, Dysphagia, unspecified                           T18.108A, Unspecified foreign body in  esophagus                            causing other injury, initial encounter CPT copyright 2022 [employer]. All rights reserved. The codes documented in this report are preliminary and upon coder review may  be revised to meet current compliance requirements. Doristine Locks, MD 03/06/2023 3:02:40 PM Number of Addenda: 0

## 2023-03-06 NOTE — Anesthesia Procedure Notes (Signed)
Procedure Name: Intubation Date/Time: 03/06/2023 2:29 PM  Performed by: Thomasene Ripple, CRNAPre-anesthesia Checklist: Patient identified, Emergency Drugs available, Suction available and Patient being monitored Patient Re-evaluated:Patient Re-evaluated prior to induction Oxygen Delivery Method: Circle System Utilized Preoxygenation: Pre-oxygenation with 100% oxygen Induction Type: IV induction and Rapid sequence Laryngoscope Size: Miller and 3 Grade View: Grade I Tube type: Oral Number of attempts: 1 Airway Equipment and Method: Stylet and Oral airway Placement Confirmation: ETT inserted through vocal cords under direct vision, positive ETCO2 and breath sounds checked- equal and bilateral Secured at: 23 cm Tube secured with: Tape Dental Injury: Teeth and Oropharynx as per pre-operative assessment

## 2023-03-06 NOTE — ED Notes (Signed)
Pt states that he was able to keep water down this am

## 2023-03-07 NOTE — Anesthesia Postprocedure Evaluation (Signed)
Anesthesia Post Note  Patient: Shaun Ball  Procedure(s) Performed: ESOPHAGOGASTRODUODENOSCOPY (EGD) BIOPSY FOREIGN BODY REMOVAL     Patient location during evaluation: PACU Anesthesia Type: General Level of consciousness: awake Pain management: pain level controlled Vital Signs Assessment: post-procedure vital signs reviewed and stable Respiratory status: spontaneous breathing, nonlabored ventilation and respiratory function stable Cardiovascular status: blood pressure returned to baseline and stable Postop Assessment: no apparent nausea or vomiting Anesthetic complications: no   No notable events documented.  Last Vitals:  Vitals:   03/06/23 1500 03/06/23 1510  BP: (!) 162/75 (!) 162/75  Pulse: 80 76  Resp: 16 18  Temp:    SpO2: 98% 99%    Last Pain:  Vitals:   03/06/23 1510  TempSrc:   PainSc: 0-No pain                 Linton Rump

## 2023-03-09 ENCOUNTER — Encounter (HOSPITAL_COMMUNITY): Payer: Self-pay | Admitting: Gastroenterology

## 2023-03-09 ENCOUNTER — Telehealth: Payer: Self-pay

## 2023-03-09 NOTE — Telephone Encounter (Signed)
Left message for pt to call back

## 2023-03-09 NOTE — Telephone Encounter (Signed)
-----   Message from Shellia Cleverly sent at 03/06/2023  3:05 PM EST ----- Patient came in for acute esophageal food impaction.  EGD today with food retrieval.  Sending home on high-dose Protonix and Carafate.  Needs follow-up appointment with Dr. Chales Abrahams or one of the APP's in the next 6 weeks or so.  Thanks.

## 2023-03-10 ENCOUNTER — Ambulatory Visit: Payer: HMO | Attending: Cardiology | Admitting: Cardiology

## 2023-03-10 ENCOUNTER — Encounter: Payer: Self-pay | Admitting: Cardiology

## 2023-03-10 VITALS — BP 126/70 | HR 59 | Ht 71.0 in | Wt 187.0 lb

## 2023-03-10 DIAGNOSIS — I63411 Cerebral infarction due to embolism of right middle cerebral artery: Secondary | ICD-10-CM

## 2023-03-10 DIAGNOSIS — D6869 Other thrombophilia: Secondary | ICD-10-CM

## 2023-03-10 DIAGNOSIS — I4892 Unspecified atrial flutter: Secondary | ICD-10-CM

## 2023-03-10 LAB — SURGICAL PATHOLOGY

## 2023-03-10 NOTE — Progress Notes (Signed)
   Electrophysiology Office Note:   Date:  03/10/2023  ID:  Shaun Ball, DOB 05-26-1950, MRN [id number]  Primary Cardiologist: None Primary Heart Failure: None Electrophysiologist: None      History of Present Illness:   Shaun Ball is a 73 y.o. male with h/o cancer, cryptogenic stroke, atrial flutter seen today for routine electrophysiology followup.   Since last being seen in our clinic the patient reports doing well.  He has noted no further arrhythmias.  He continues to be quite active and has no acute complaints.  he denies chest pain, palpitations, dyspnea, PND, orthopnea, nausea, vomiting, dizziness, syncope, edema, weight gain, or early satiety.   Review of systems complete and found to be negative unless listed in HPI.   Device History: Medtronic loop recorder implanted  for Cryptogenic Stroke  EP Information / Studies Reviewed:    EKG is ordered today. Personal review as below.  EKG Interpretation Date/Time:  Tuesday March 10 2023 08:46:16 EST Ventricular Rate:  59 PR Interval:  192 QRS Duration:  84 QT Interval:  382 QTC Calculation: 378 R Axis:   44  Text Interpretation: Sinus bradycardia When compared with ECG of 30-May-2022 09:56, No significant change was found Confirmed by Erricka Falkner ([id number]) on 03/10/2023 8:50:18 AM     Risk Assessment/Calculations:    CHA2DS2-VASc Score = 4   This indicates a 4.8% annual risk of stroke. The patient's score is based upon: CHF History: 0 HTN History: 1 Diabetes History: 0 Stroke History: 2 Vascular Disease History: 0 Age Score: 1 Gender Score: 0             Physical Exam:   VS:  BP 126/70 (BP Location: Left Arm, Patient Position: Sitting, Cuff Size: Large)   Pulse (!) 59   Ht 5\' 11"  (1.803 m)   Wt 187 lb (84.8 kg)   SpO2 96%   BMI 26.08 kg/m    Wt Readings from Last 3 Encounters:  03/06/23 189 lb (85.7 kg)  03/10/23 187 lb (84.8 kg)  08/08/22 187 lb (84.8 kg)     GEN: Well nourished, well  developed in no acute distress NECK: No JVD; No carotid bruits CARDIAC: Regular rate and rhythm, no murmurs, rubs, gallops RESPIRATORY:  Clear to auscultation without rales, wheezing or rhonchi  ABDOMEN: Soft, non-tender, non-distended EXTREMITIES:  No edema; No deformity   ILR Interrogation- reviewed in detail today,  See PACEART report  ASSESSMENT AND PLAN:    Cryptogenic Stroke s/p Medtronic Loop recorder Normal device function See Pace Art report Wishes to stop remote monitoring at this time.  2.  Atrial flutter: Noted on ILR.  Currently on Eliquis.  Minimal episodes.  Currently feeling well.  He has noted no further arrhythmias.  He is happy with his control.  At this point, he wishes to stop remote monitoring for atrial flutter and fibrillation.  He wishes to follow-up with his primary physician to prescribe his Eliquis.  I [name] be happy to see him back if further issues arise.  3.  Hypertension: Currently well-controlled  4.  Secondary hypercoagulable state: Currently on Eliquis for atrial flutter     Follow up with Dr. Elberta Fortis  as needed   Signed, Javontay Vandam Jorja Loa, MD

## 2023-03-10 NOTE — Telephone Encounter (Signed)
Pt was made aware of Dr Barron Alvine recommendations: Pt was scheduled to see Hyacinth Meeker PA on 04/17/2023 at 10:00 AM. Pt made aware. Pt verbalized understanding with all questions answered.

## 2023-03-16 ENCOUNTER — Encounter: Payer: Self-pay | Admitting: Gastroenterology

## 2023-04-17 ENCOUNTER — Ambulatory Visit: Payer: HMO | Admitting: Physician Assistant

## 2023-04-20 ENCOUNTER — Other Ambulatory Visit: Payer: Self-pay

## 2023-04-20 DIAGNOSIS — I6529 Occlusion and stenosis of unspecified carotid artery: Secondary | ICD-10-CM

## 2023-04-24 ENCOUNTER — Ambulatory Visit (HOSPITAL_COMMUNITY)
Admission: RE | Admit: 2023-04-24 | Discharge: 2023-04-24 | Disposition: A | Discharge status: Home / Self Care | Payer: HMO | Source: Ambulatory Visit | Attending: Vascular Surgery | Admitting: Vascular Surgery

## 2023-04-24 ENCOUNTER — Ambulatory Visit: Payer: HMO | Admitting: Physician Assistant

## 2023-04-24 VITALS — BP 131/74 | HR 60 | Temp 97.9°F | Ht 71.0 in | Wt 185.8 lb

## 2023-04-24 DIAGNOSIS — I6529 Occlusion and stenosis of unspecified carotid artery: Secondary | ICD-10-CM | POA: Diagnosis not present

## 2023-04-24 NOTE — Progress Notes (Signed)
 Office Note     CC:  follow up Requesting Provider:  Adrian Prince, MD  HPI: Shaun Ball is a 73 y.o. (1950/06/14) male who presents for surveillance of carotid artery stenosis.  He has history of a stroke in 2012.  Since last office visit he denies any strokelike symptoms including slurring speech, changes in vision, or one-sided weakness.  He follows regularly with his PCP for management of hypertension and hyperlipidemia.  He is on Eliquis for atrial fibrillation.  He is on Repatha for hyperlipidemia.  He is a former smoker.   Past Medical History:  Diagnosis Date   Arthritis    Bladder cancer (HCC)    CVA (cerebral vascular accident) (HCC) 09/2018   History of kidney stones 2012   passed / has renal cyst to evaluated at later time   Hypertension    Wears glasses     Past Surgical History:  Procedure Laterality Date   BIOPSY  05/04/2022   Procedure: BIOPSY;  Surgeon: Lemar Lofty., MD;  Location: [facility];  Service: Gastroenterology;;   BIOPSY  03/06/2023   Procedure: BIOPSY;  Surgeon: Shellia Cleverly, DO;  Location: [facility];  Service: Gastroenterology;;   Thressa Sheller STUDY  10/01/2018   Procedure: BUBBLE STUDY;  Surgeon: Pricilla Riffle, MD;  Location: [facility];  Service: Cardiovascular;;   COLONOSCOPY  10/25/2007   [facility]. Dr Claudine Mouton. Polyp(s) of the right colon Minimal Hemorrhoidal disease.   CYSTOSCOPY N/A 11/05/2016   Procedure: CYSTOSCOPY;  Surgeon: Rene Paci, MD;  Location: [facility];  Service: Urology;  Laterality: N/A;   CYSTOSCOPY WITH BIOPSY Bilateral 10/24/2016   Procedure: CYSTOSCOPY WITH BLADDER BIOPSY/ BILATERAL RETROGRADE [id number];  Surgeon: Rene Paci, MD;  Location: [facility];  Service: Urology;  Laterality: Bilateral;   CYSTOSCOPY WITH BIOPSY N/A 05/12/2018   Procedure: CYSTOSCOPY WITH BLADDER BIOPSY/ BILATERAL RETROGRADE PYELOGRAM BIOPSY;   Surgeon: Rene Paci, MD;  Location: [facility];  Service: Urology;  Laterality: N/A;   ESOPHAGOGASTRODUODENOSCOPY N/A 05/04/2022   Procedure: ESOPHAGOGASTRODUODENOSCOPY (EGD);  Surgeon: Lemar Lofty., MD;  Location: [facility];  Service: Gastroenterology;  Laterality: N/A;   ESOPHAGOGASTRODUODENOSCOPY N/A 03/06/2023   Procedure: ESOPHAGOGASTRODUODENOSCOPY (EGD);  Surgeon: Shellia Cleverly, DO;  Location: [facility];  Service: Gastroenterology;  Laterality: N/A;   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 left 03-25-2006;  right 07-07-2006   and Gastroc Slide   fattytumorremoved from back      FOREIGN BODY REMOVAL  05/04/2022   Procedure: FOREIGN BODY REMOVAL;  Surgeon: Meridee Score Netty Starring., MD;  Location: [facility];  Service: Gastroenterology;;   FOREIGN BODY REMOVAL  03/06/2023   Procedure: FOREIGN BODY REMOVAL;  Surgeon: Shellia Cleverly, DO;  Location: [facility];  Service: Gastroenterology;;   LOOP RECORDER INSERTION N/A 10/01/2018   Procedure: LOOP RECORDER INSERTION;  Surgeon: Regan Lemming, MD;  Location: [facility];  Service: Cardiovascular;  Laterality: N/A;   TEE WITHOUT CARDIOVERSION N/A 10/01/2018   Procedure: TRANSESOPHAGEAL ECHOCARDIOGRAM (TEE);  Surgeon: Pricilla Riffle, MD;  Location: [facility] END[facility]COPY;  Service: Cardiovascular;  Laterality: N/A;   TRANSURETHRAL RESECTION OF BLADDER TUMOR N/A 11/05/2016   Procedure: TRANSURETHRAL RESECTION OF BLADDER TUMOR (TURBT);  Surgeon: Rene Paci, MD;  Location: [facility];  Service: Urology;  Laterality: N/A;    Social History   Socioeconomic History   Marital status: Legally Separated    Spouse name: Not on file  Number of children: Not on file   Years of education: Not on file   Highest education level: Not on file  Occupational History   Occupation: retired  Tobacco Use   Smoking status: Former    Current packs/day: 0.00     Types: Cigarettes    Start date: 10/22/1971    Quit date: 10/22/1999    Years since quitting: 23.5    Passive exposure: Never   Smokeless tobacco: Never   Tobacco comments:    Former smoker 11/22/2020  Vaping Use   Vaping status: Never Used  Substance and Sexual Activity   Alcohol use: Yes    Alcohol/week: 5.0 - 6.0 standard drinks of alcohol    Types: 5 - 6 Cans of beer per week    Comment: 5-6 beers in a sitting 05/30/21   Drug use: No   Sexual activity: Not on file  Other Topics Concern   Not on file  Social History Narrative   Shaun Ball a 73 year old retired legally separated male He lives at home and has the assistance of his son and Trula Ore    Social Drivers of Health   Financial Resource Strain: Low Risk  (10/06/2018)   Overall Financial Resource Strain (CARDIA)    Difficulty of Paying Living Expenses: Not hard at all  Food Insecurity: No Food Insecurity (10/06/2018)   Hunger Vital Sign    Worried About Running Out of Food in the Last Year: Never true    Ran Out of Food in the Last Year: Never true  Transportation Needs: No Transportation Needs (10/06/2018)   PRAPARE - Administrator, Civil Service (Medical): No    Lack of Transportation (Non-Medical): No  Physical Activity: Inactive (10/06/2018)   Exercise Vital Sign    Days of Exercise per Week: 0 days    Minutes of Exercise per Session: 0 min  Stress: No Stress Concern Present (10/06/2018)   [employer] of Occupational Health - Occupational Stress Questionnaire    Feeling of Stress : Not at all  Social Connections: Not on file  Intimate Partner Violence: Not on file    Family History  Problem Relation Age of Onset   Blindness Mother    Cataracts Mother    Diabetes Mother    Glaucoma Mother    Macular degeneration Mother    Cataracts Father    Diabetes Father    Diabetes Sister    Diabetes Brother    Blindness Maternal Uncle    Macular degeneration Maternal Uncle    Macular  degeneration Paternal Aunt    Amblyopia Neg Hx    Retinal detachment Neg Hx    Strabismus Neg Hx    Retinitis pigmentosa Neg Hx     Current Outpatient Medications  Medication Sig Dispense Refill   acetaminophen (TYLENOL) 500 MG tablet Take 500 mg by mouth every 6 (six) hours as needed for moderate pain.     apixaban (ELIQUIS) 5 MG TABS tablet Take 1 tablet (5 mg total) by mouth 2 (two) times daily. 60 tablet 11   diphenhydrAMINE (BENADRYL) 25 mg capsule Take 25 mg by mouth every 6 (six) hours as needed for allergies.     DULoxetine (CYMBALTA) 30 MG capsule Take 30 mg by mouth daily. (Patient not taking: Reported on 04/24/2023)     fluticasone (FLONASE) 50 MCG/ACT nasal spray Place 1 spray into both nostrils as needed for allergies or rhinitis.     lisinopril (PRINIVIL,ZESTRIL) 40 MG tablet  Take 40 mg by mouth every evening.      Melatonin 5 MG TABS Take 5 mg by mouth at bedtime.     montelukast (SINGULAIR) 10 MG tablet Take 10 mg by mouth daily as needed (allergies).   6   oxybutynin (DITROPAN-XL) 5 MG 24 hr tablet Take 5 mg by mouth daily. (Patient not taking: Reported on 04/24/2023)     pantoprazole (PROTONIX) 40 MG tablet Take 1 tablet (40 mg total) by mouth 2 (two) times daily before a meal. 180 tablet 1   REPATHA SURECLICK 140 MG/ML SOAJ Inject as directed every 14 (fourteen) days.     sildenafil (VIAGRA) 100 MG tablet Take 100 mg by mouth daily as needed for erectile dysfunction.     sucralfate (CARAFATE) 1 GM/10ML suspension Take 10 mLs (1 g total) by mouth 4 (four) times daily -  with meals and at bedtime. (Patient not taking: Reported on 03/10/2023) 420 mL 2   tamsulosin (FLOMAX) 0.4 MG CAPS capsule Take 1 capsule (0.4 mg total) by mouth daily. 30 capsule 11   Current Facility-Administered Medications  Medication Dose Route Frequency Provider Last Rate Last Admin   0.9 %  sodium chloride infusion  500 mL Intravenous Continuous Lynann Bologna, MD        Allergies  Allergen Reactions    Statins Hives and Rash    whelps,liver enzymes elevated   Iohexol Other (See Comments)    Pt had sneezing after his CT with IV contrast, lasting only 10- 15 minutes, no intervention was needed, per radiologist     REVIEW OF SYSTEMS:   [X]  denotes positive finding, [ ]  denotes negative finding Cardiac  Comments:  Chest pain or chest pressure:    Shortness of breath upon exertion:    Short of breath when lying flat:    Irregular heart rhythm:        Vascular    Pain in calf, thigh, or hip brought on by ambulation:    Pain in feet at night that wakes you up from your sleep:     Blood clot in your veins:    Leg swelling:         Pulmonary    Oxygen at home:    Productive cough:     Wheezing:         Neurologic    Sudden weakness in arms or legs:     Sudden numbness in arms or legs:     Sudden onset of difficulty speaking or slurred speech:    Temporary loss of vision in one eye:     Problems with dizziness:         Gastrointestinal    Blood in stool:     Vomited blood:         Genitourinary    Burning when urinating:     Blood in urine:        Psychiatric    Major depression:         Hematologic    Bleeding problems:    Problems with blood clotting too easily:        Skin    Rashes or ulcers:        Constitutional    Fever or chills:      PHYSICAL EXAMINATION:  Vitals:   04/24/23 0859  BP: 131/74  Pulse: 60  Temp: 97.9 F (36.6 C)  SpO2: 96%  Weight: 185 lb 12.8 oz (84.3 kg)  Height: 5\' 11"  (1.803 m)  General:  WDWN in NAD; vital signs documented above Gait: Not observed HENT: WNL, normocephalic Pulmonary: normal non-labored breathing , without Rales, rhonchi,  wheezing Cardiac: regular HR Abdomen: soft, NT, no masses Skin: without rashes Vascular Exam/Pulses: palpable radial pulses Extremities: without ischemic changes, without Gangrene , without cellulitis; without open wounds;  Musculoskeletal: no muscle wasting or  atrophy  Neurologic: A&O X 3; CN grossly intact Psychiatric:  The pt has Normal affect.   Non-Invasive Vascular Imaging:   R ICA 40-59% L ICA 1-39%    ASSESSMENT/PLAN:: 73 y.o. male here for follow up for surveillance of carotid artery stenosis  Shaun. Gerrit Ball is a 73 year old male who returns today for carotid duplex.  Overall he is doing well and has not had any strokelike symptoms since last office visit.  Carotid duplex shows 1 to 39% stenosis of the left ICA and 40 to 59% stenosis of the right ICA.  Previously right ICA was 1 to 39% stenosis.  Based on the peak systolic velocities, stenosis is nearly unchanged from last office visit suggesting that degree of stenosis is right around 40%.  We will continue annual surveillance with duplex.  He knows to call/return office sooner with any questions or concerns.   Shaun Rutter, PA-C [facility] [phone]  Clinic MD:   Hetty Blend

## 2023-05-19 DIAGNOSIS — I6523 Occlusion and stenosis of bilateral carotid arteries: Secondary | ICD-10-CM | POA: Diagnosis not present

## 2023-05-19 DIAGNOSIS — K760 Fatty (change of) liver, not elsewhere classified: Secondary | ICD-10-CM | POA: Diagnosis not present

## 2023-05-19 DIAGNOSIS — C679 Malignant neoplasm of bladder, unspecified: Secondary | ICD-10-CM | POA: Diagnosis not present

## 2023-05-19 DIAGNOSIS — I700 Atherosclerosis of aorta: Secondary | ICD-10-CM | POA: Diagnosis not present

## 2023-05-19 DIAGNOSIS — R7301 Impaired fasting glucose: Secondary | ICD-10-CM | POA: Diagnosis not present

## 2023-05-19 DIAGNOSIS — K219 Gastro-esophageal reflux disease without esophagitis: Secondary | ICD-10-CM | POA: Diagnosis not present

## 2023-05-19 DIAGNOSIS — I4892 Unspecified atrial flutter: Secondary | ICD-10-CM | POA: Diagnosis not present

## 2023-05-19 DIAGNOSIS — D126 Benign neoplasm of colon, unspecified: Secondary | ICD-10-CM | POA: Diagnosis not present

## 2023-05-19 DIAGNOSIS — H409 Unspecified glaucoma: Secondary | ICD-10-CM | POA: Diagnosis not present

## 2023-05-19 DIAGNOSIS — I10 Essential (primary) hypertension: Secondary | ICD-10-CM | POA: Diagnosis not present

## 2023-05-19 DIAGNOSIS — E785 Hyperlipidemia, unspecified: Secondary | ICD-10-CM | POA: Diagnosis not present

## 2023-05-19 DIAGNOSIS — N401 Enlarged prostate with lower urinary tract symptoms: Secondary | ICD-10-CM | POA: Diagnosis not present

## 2023-08-21 DIAGNOSIS — Z8551 Personal history of malignant neoplasm of bladder: Secondary | ICD-10-CM | POA: Diagnosis not present

## 2023-08-21 DIAGNOSIS — N401 Enlarged prostate with lower urinary tract symptoms: Secondary | ICD-10-CM | POA: Diagnosis not present

## 2023-08-21 DIAGNOSIS — R3915 Urgency of urination: Secondary | ICD-10-CM | POA: Diagnosis not present

## 2023-08-21 DIAGNOSIS — R8289 Other abnormal findings on cytological and histological examination of urine: Secondary | ICD-10-CM | POA: Diagnosis not present

## 2023-09-02 ENCOUNTER — Other Ambulatory Visit: Payer: Self-pay | Admitting: Gastroenterology

## 2023-10-22 ENCOUNTER — Encounter (HOSPITAL_BASED_OUTPATIENT_CLINIC_OR_DEPARTMENT_OTHER): Payer: Self-pay

## 2023-10-22 ENCOUNTER — Other Ambulatory Visit (HOSPITAL_COMMUNITY): Payer: Self-pay | Admitting: Nurse Practitioner

## 2023-10-22 ENCOUNTER — Other Ambulatory Visit (HOSPITAL_BASED_OUTPATIENT_CLINIC_OR_DEPARTMENT_OTHER)

## 2023-10-22 DIAGNOSIS — R7401 Elevation of levels of liver transaminase levels: Secondary | ICD-10-CM | POA: Diagnosis not present

## 2023-10-22 DIAGNOSIS — I6523 Occlusion and stenosis of bilateral carotid arteries: Secondary | ICD-10-CM | POA: Diagnosis not present

## 2023-10-22 DIAGNOSIS — I10 Essential (primary) hypertension: Secondary | ICD-10-CM | POA: Diagnosis not present

## 2023-10-22 DIAGNOSIS — R195 Other fecal abnormalities: Secondary | ICD-10-CM

## 2023-10-22 DIAGNOSIS — D126 Benign neoplasm of colon, unspecified: Secondary | ICD-10-CM

## 2023-10-22 DIAGNOSIS — I4892 Unspecified atrial flutter: Secondary | ICD-10-CM | POA: Diagnosis not present

## 2023-10-22 DIAGNOSIS — R1030 Lower abdominal pain, unspecified: Secondary | ICD-10-CM | POA: Diagnosis not present

## 2023-10-22 DIAGNOSIS — K31819 Angiodysplasia of stomach and duodenum without bleeding: Secondary | ICD-10-CM | POA: Diagnosis not present

## 2023-10-22 DIAGNOSIS — K5900 Constipation, unspecified: Secondary | ICD-10-CM | POA: Diagnosis not present

## 2023-10-22 DIAGNOSIS — Z8673 Personal history of transient ischemic attack (TIA), and cerebral infarction without residual deficits: Secondary | ICD-10-CM | POA: Diagnosis not present

## 2023-10-22 DIAGNOSIS — R945 Abnormal results of liver function studies: Secondary | ICD-10-CM | POA: Diagnosis not present

## 2023-10-22 DIAGNOSIS — C679 Malignant neoplasm of bladder, unspecified: Secondary | ICD-10-CM | POA: Diagnosis not present

## 2023-10-23 ENCOUNTER — Ambulatory Visit (HOSPITAL_BASED_OUTPATIENT_CLINIC_OR_DEPARTMENT_OTHER)
Admission: RE | Admit: 2023-10-23 | Discharge: 2023-10-23 | Disposition: A | Discharge status: Home / Self Care | Source: Ambulatory Visit | Attending: Nurse Practitioner | Admitting: Nurse Practitioner

## 2023-10-23 DIAGNOSIS — R7401 Elevation of levels of liver transaminase levels: Secondary | ICD-10-CM

## 2023-10-23 DIAGNOSIS — R195 Other fecal abnormalities: Secondary | ICD-10-CM

## 2023-10-23 DIAGNOSIS — D126 Benign neoplasm of colon, unspecified: Secondary | ICD-10-CM

## 2023-10-23 DIAGNOSIS — R1030 Lower abdominal pain, unspecified: Secondary | ICD-10-CM

## 2023-10-23 NOTE — Progress Notes (Signed)
 Patient arrived for outpatient scan with a contrast scan, in the patients chart an Iohexol  allergy is documented. The last contrast admin required no intervention, at that time. Today patient was rescheduled 10/23/23 due to no pre-medication ordered/given. Spoke to Radiologist, Dr Landy and the Radiologist does want allergy prep before contrast admin. Dr Landy is available for questions.

## 2023-10-29 ENCOUNTER — Ambulatory Visit (HOSPITAL_BASED_OUTPATIENT_CLINIC_OR_DEPARTMENT_OTHER)
Admission: RE | Admit: 2023-10-29 | Discharge: 2023-10-29 | Disposition: A | Discharge status: Home / Self Care | Source: Ambulatory Visit | Attending: Nurse Practitioner | Admitting: Nurse Practitioner

## 2023-10-29 DIAGNOSIS — R195 Other fecal abnormalities: Secondary | ICD-10-CM | POA: Diagnosis not present

## 2023-10-29 DIAGNOSIS — R1030 Lower abdominal pain, unspecified: Secondary | ICD-10-CM | POA: Insufficient documentation

## 2023-10-29 DIAGNOSIS — R7401 Elevation of levels of liver transaminase levels: Secondary | ICD-10-CM | POA: Diagnosis not present

## 2023-10-29 DIAGNOSIS — D126 Benign neoplasm of colon, unspecified: Secondary | ICD-10-CM | POA: Diagnosis not present

## 2023-10-29 DIAGNOSIS — K4090 Unilateral inguinal hernia, without obstruction or gangrene, not specified as recurrent: Secondary | ICD-10-CM | POA: Diagnosis not present

## 2023-10-29 DIAGNOSIS — K760 Fatty (change of) liver, not elsewhere classified: Secondary | ICD-10-CM | POA: Diagnosis not present

## 2023-10-29 DIAGNOSIS — R102 Pelvic and perineal pain: Secondary | ICD-10-CM | POA: Diagnosis not present

## 2023-10-29 MED ORDER — IOHEXOL 300 MG/ML  SOLN
100.0000 mL | Freq: Once | INTRAMUSCULAR | Status: AC | PRN
  Administered 2023-10-29: 100 mL via INTRAVENOUS

## 2023-11-24 DIAGNOSIS — R7301 Impaired fasting glucose: Secondary | ICD-10-CM | POA: Diagnosis not present

## 2023-11-24 DIAGNOSIS — E785 Hyperlipidemia, unspecified: Secondary | ICD-10-CM | POA: Diagnosis not present

## 2023-11-24 DIAGNOSIS — Z125 Encounter for screening for malignant neoplasm of prostate: Secondary | ICD-10-CM | POA: Diagnosis not present

## 2023-11-24 DIAGNOSIS — K219 Gastro-esophageal reflux disease without esophagitis: Secondary | ICD-10-CM | POA: Diagnosis not present

## 2023-11-24 DIAGNOSIS — Z0189 Encounter for other specified special examinations: Secondary | ICD-10-CM | POA: Diagnosis not present

## 2023-11-24 DIAGNOSIS — I10 Essential (primary) hypertension: Secondary | ICD-10-CM | POA: Diagnosis not present

## 2023-11-24 DIAGNOSIS — Z1212 Encounter for screening for malignant neoplasm of rectum: Secondary | ICD-10-CM | POA: Diagnosis not present

## 2023-11-25 ENCOUNTER — Ambulatory Visit: Payer: Self-pay | Admitting: General Surgery

## 2023-11-25 ENCOUNTER — Telehealth (HOSPITAL_BASED_OUTPATIENT_CLINIC_OR_DEPARTMENT_OTHER): Payer: Self-pay

## 2023-11-25 ENCOUNTER — Encounter: Payer: Self-pay | Admitting: Cardiology

## 2023-11-25 DIAGNOSIS — K4090 Unilateral inguinal hernia, without obstruction or gangrene, not specified as recurrent: Secondary | ICD-10-CM | POA: Diagnosis not present

## 2023-11-25 DIAGNOSIS — Z01818 Encounter for other preprocedural examination: Secondary | ICD-10-CM

## 2023-11-25 NOTE — Telephone Encounter (Signed)
   Pre-operative Risk Assessment    Patient Name: Shaun Ball  DOB: 1950/07/22 MRN: [id number]   Date of last office visit: 03/10/2023 with Dr. Inocencio Date of next office visit: N/A  Request for Surgical Clearance    Procedure:  Hernia surgery  Date of Surgery:  Clearance TBD                                 Surgeon:  Dr. Lynda Leos  Surgeon's Group or Practice Name:  [facility] Phone number:  [phone] Fax number:  [phone]   Type of Clearance Requested:   - Medical  - Pharmacy:  Hold Apixaban  (Eliquis ) - HAS MEDTRONIC ILR   Type of Anesthesia:  General     Additional requests/questions:  Pt has Medtronic ILR - [facility] Please Fax a note of cardiac clearance to [phone] Attn: Rosaline Sprang, CMA Please call to advise if this patient will require an office visit or further medical work-up before clearance can be given.  Please call [phone] (main number) and leave a message with the triage nurse  Signed, Patrcia Iverson CROME   11/25/2023, 2:20 PM

## 2023-11-25 NOTE — Progress Notes (Signed)
 PERIOPERATIVE PRESCRIPTION FOR IMPLANTED CARDIAC DEVICE PROGRAMMING  Patient Information: Name:  Shaun Ball  DOB:  1950/07/29  MRN:  [id number]   Procedure:  Hernia surgery   Date of Surgery:  Clearance TBD                                Surgeon:  Dr. Lynda Leos        Surgeon's Group or Practice Name:  [facility] Phone number:  [phone] Fax number:  [phone]    Device Information:  Clinic EP Physician:  Soyla Norton, MD   Device Type: Implantable Loop Recorder (ILR) Manufacturer and Phone #:  Medtronic: [phone] Pacemaker Dependent?:  No. Date of Last Device Check: 01/18/2023 - Patient declines remote monitoring.    Electrophysiologist's Recommendations:  Have magnet available. Provide continuous ECG monitoring when magnet is used or reprogramming is to be performed.  Procedure should not interfere with device function.  No device programming or magnet placement needed.  Per Device Clinic Standing Orders, [name], [place]  4:14 PM 11/25/2023

## 2023-11-25 NOTE — Telephone Encounter (Signed)
 Please order CBC and BMET for pharmacy to make recommendations on Eliquis .  Will need VV please after pharmacy makes recommendations.

## 2023-11-25 NOTE — H&P (Signed)
 History of Present Illness: Shaun Ball is a 73 y.o. male who is seen today as an office consultation at the request of Dr. Nichole for evaluation of New Consultation ( moderate left ing hernia containging fat) .   History of Present Illness Shaun Ball is a 73 year old male with atrial fibrillation and prior strokes who presents with discomfort in the lower abdomen due to a hernia. He was referred by Dr. Nichole for evaluation of a hernia.  He experiences intermittent discomfort in the lower abdomen on the left side. A CT scan confirmed a hernia on the left side. He recalls a potential issue in the lower groin area noted during an MRI in 2020. He remains active, engaging in activities such as riding a motorcycle and wood splitting, which involve lifting. He prefers to address the hernia now rather than later. He inquires about the hernia's potential impact on erections, noting that pain could affect this aspect of his life.  He is on Eliquis  for atrial fibrillation. He experienced multiple strokes in 2020, initially causing mobility concerns. He has a monitoring chip for his heart condition but no longer follows with a cardiologist as he feels well and is not short of breath. He experiences occasional minor chest pain and back aching.     Review of Systems: A complete review of systems was obtained from the patient.  I have reviewed this information and discussed as appropriate with the patient.  See HPI as well for other ROS.  Review of Systems  Constitutional:  Negative for fever.  HENT:  Negative for congestion.   Eyes:  Negative for blurred vision.  Respiratory:  Negative for cough, shortness of breath and wheezing.   Cardiovascular:  Negative for chest pain and palpitations.  Gastrointestinal:  Negative for heartburn.  Genitourinary:  Negative for dysuria.  Musculoskeletal:  Negative for myalgias.  Skin:  Negative for rash.  Neurological:  Negative for dizziness and headaches.   Psychiatric/Behavioral:  Negative for depression and suicidal ideas.   All other systems reviewed and are negative.     Medical History: Past Medical History: Diagnosis Date  Arthritis   Bladder cancer (CMS/HHS-HCC)   CVA (cerebral vascular accident) (CMS/HHS-HCC) 09/2018  History of kidney stones 2012  Hypertension    There is no problem list on file for this patient.   Past Surgical History: Procedure Laterality Date  Excision haglund's deformity with achilles tendon repair  Left 03/25/2006  Excision haglund's deformity with achilles tendon repair  Right 07/07/2006  COLONOSCOPY  10/25/2007  Cystoscopy with biopsy  Bilateral 10/24/2016  Transurethral resection of bladder tumor  11/05/2016  CYSTOSCOPY  11/05/2016  Cystoscopy with biopsy   05/12/2018  Tee without cardioversion   10/01/2018   Bubble study   10/01/2018  Loop recorder insertion   10/01/2018  ESOPHAGOGASTRODUODENOSCOPY, FLEXIBLE, TRANSORAL FOR BARIATRIC BALLOON ADJUSTMENT  05/04/2022  ESOPHAGOGASTRODUODENOSCOPY, FLEXIBLE, TRANSORAL FOR BARIATRIC BALLOON ADJUSTMENT  03/06/2023  fatty tumor removed from back      Allergies Allergen Reactions  Statins-Hmg-Coa Reductase Inhibitors Hives and Rash   whelps,liver enzymes elevated  Ezetimibe  Itching  Iohexol  Other (See Comments)   Pt had sneezing after his CT with IV contrast, lasting only 10- 15 minutes, no intervention was needed, per radiologist   Current Outpatient Medications on File Prior to Visit Medication Sig Dispense Refill  apixaban  (ELIQUIS ) 5 mg tablet Take 5 mg by mouth 2 (two) times daily    DULoxetine (CYMBALTA) 30 MG DR capsule Take 30 mg by mouth  once daily    fluticasone  propionate (FLONASE ) 50 mcg/actuation nasal spray Place 1 spray into one nostril as needed    lisinopriL  (ZESTRIL ) 40 MG tablet Take 40 mg by mouth every evening    melatonin-pyridoxine, vit B6, (MELATONIN, WITH B6,) 5-1 mg Tab Take 5 mg by mouth at bedtime    montelukast   (SINGULAIR ) 10 mg tablet Take 10 mg by mouth once daily    pantoprazole  (PROTONIX ) 40 MG DR tablet Take 40 mg by mouth 2 (two) times daily before meals    REPATHA SURECLICK 140 mg/mL PnIj INJECT 1 SUBCUTANEOUSLY EVERY OTHER WEEK FOR 28 DAYS    sildenafiL (VIAGRA) 100 MG tablet Take 100 mg by mouth once daily as needed for Erectile Dysfunction    sucralfate  (CARAFATE ) 100 mg/mL suspension Take 1 g by mouth 4 (four) times daily    No current facility-administered medications on file prior to visit.   Family History Problem Relation Age of Onset  Blindness Mother   Cataracts Mother   Glaucoma Mother   Macular degeneration Mother   Diabetes Mother   Cataracts Father   Diabetes Father   Diabetes Sister   Diabetes Brother   Blindness Maternal Uncle   Macular degeneration Maternal Uncle   Macular degeneration Paternal Aunt   Amblyopia Neg Hx   Retinal detachment Neg Hx   Strabismus Neg Hx   Retinitis pigmentosa Neg Hx     Social History  Tobacco Use Smoking Status Former  Types: Cigarettes  Passive exposure: Never Smokeless Tobacco Never    Social History  Socioeconomic History  Marital status: Legally Separated Tobacco Use  Smoking status: Former   Types: Cigarettes   Passive exposure: Never  Smokeless tobacco: Never Substance and Sexual Activity  Alcohol  use: Defer  Drug use: Defer  Sexual activity: Defer  Social Drivers of Health  Financial Resource Strain: Low Risk  (10/06/2018)  Received from [facility]  Overall Financial Resource Strain (CARDIA)   Difficulty of Paying Living Expenses: Not hard at all Food Insecurity: No Food Insecurity (10/06/2018)  [facility]  Hunger Vital Sign   Within the past 12 months, you worried that your food would run out before you got the money to buy more.: Never true   Within the past 12 months, the food you bought just didn't last and you didn't have money to get more.: Never true Transportation Needs: No  Transportation Needs (10/06/2018)  [facility] - [facility]   Lack of Transportation (Medical): No   Lack of Transportation (Non-Medical): No Physical Activity: Inactive (10/06/2018)  Received from [facility]  Exercise Vital Sign   On average, how many days per week do you engage in moderate to strenuous exercise (like a brisk walk)?: 0 days   On average, how many minutes do you engage in exercise at this level?: 0 min Stress: No Stress Concern Present (10/06/2018)  Received from [facility] Occupational [facility] - Occupational Stress Questionnaire   Feeling of Stress : Not at all Housing Stability: Unknown (11/25/2023)  Housing Stability Vital Sign   Homeless in the Last Year: No   Objective:   Vitals:  11/25/23 1129 BP: 134/75 Pulse: 92 Temp: 36.9 C (98.4 F) TempSrc: Temporal SpO2: 97% Weight: 87.2 kg (192 lb 3.2 oz) Height: 180.3 cm (5' 11) PainSc: 0-No pain   Body mass index is 26.81 kg/m. Physical Exam Constitutional:      Appearance: Normal appearance.  HENT:  Head: Normocephalic and atraumatic.     Nose: Nose normal. No congestion.     Mouth/Throat:     Mouth: Mucous membranes are moist.     Pharynx: Oropharynx is clear.  Eyes:     Pupils: Pupils are equal, round, and reactive to light.  Cardiovascular:     Rate and Rhythm: Normal rate and regular rhythm.     Pulses: Normal pulses.     Heart sounds: Normal heart sounds. No murmur heard.    No friction rub. No gallop.  Pulmonary:     Effort: Pulmonary effort is normal. No respiratory distress.     Breath sounds: Normal breath sounds. No stridor. No wheezing, rhonchi or rales.  Abdominal:     General: Abdomen is flat.     Hernia: A hernia is present. Hernia is present in the left inguinal area. There is no hernia in the right inguinal area.  Musculoskeletal:        General: Normal range of motion.     Cervical back: Normal range of motion.  Skin:     General: Skin is warm and dry.  Neurological:     General: No focal deficit present.     Mental Status: He is alert and oriented to person, place, and time.  Psychiatric:        Mood and Affect: Mood normal.        Thought Content: Thought content normal.       Assessment and Plan: Diagnoses and all orders for this visit:  Unilateral inguinal hernia without obstruction or gangrene, recurrence not specified    Shaun Ball is a 73 y.o. male   1.  We will proceed to the OR for a LAP LEFT inguinal hernia repair with mesh. 2. All risks and benefits were discussed with the patient, to generally include infection, bleeding, damage to surrounding structures, acute and chronic nerve pain, and recurrence. Alternatives were offered and described.  All questions were answered and the patient voiced understanding of the procedure and wishes to proceed at this point.       No follow-ups on file.  Lynda Leos, MD, [facility], [place] [facility]

## 2023-11-25 NOTE — Telephone Encounter (Signed)
 Pt needs labs CBC/BMP to be done . This will be followed with a tele proep appt about 1 week later. We will need the results for blood thinner.

## 2023-11-28 ENCOUNTER — Other Ambulatory Visit: Payer: Self-pay | Admitting: Gastroenterology

## 2023-12-01 DIAGNOSIS — E785 Hyperlipidemia, unspecified: Secondary | ICD-10-CM | POA: Diagnosis not present

## 2023-12-01 DIAGNOSIS — N401 Enlarged prostate with lower urinary tract symptoms: Secondary | ICD-10-CM | POA: Diagnosis not present

## 2023-12-01 DIAGNOSIS — I4892 Unspecified atrial flutter: Secondary | ICD-10-CM | POA: Diagnosis not present

## 2023-12-01 DIAGNOSIS — Z0000 Encounter for general adult medical examination without abnormal findings: Secondary | ICD-10-CM | POA: Diagnosis not present

## 2023-12-01 DIAGNOSIS — K760 Fatty (change of) liver, not elsewhere classified: Secondary | ICD-10-CM | POA: Diagnosis not present

## 2023-12-01 DIAGNOSIS — I6523 Occlusion and stenosis of bilateral carotid arteries: Secondary | ICD-10-CM | POA: Diagnosis not present

## 2023-12-01 DIAGNOSIS — Z1331 Encounter for screening for depression: Secondary | ICD-10-CM | POA: Diagnosis not present

## 2023-12-01 DIAGNOSIS — C679 Malignant neoplasm of bladder, unspecified: Secondary | ICD-10-CM | POA: Diagnosis not present

## 2023-12-01 DIAGNOSIS — D126 Benign neoplasm of colon, unspecified: Secondary | ICD-10-CM | POA: Diagnosis not present

## 2023-12-01 DIAGNOSIS — I10 Essential (primary) hypertension: Secondary | ICD-10-CM | POA: Diagnosis not present

## 2023-12-01 DIAGNOSIS — K219 Gastro-esophageal reflux disease without esophagitis: Secondary | ICD-10-CM | POA: Diagnosis not present

## 2023-12-01 DIAGNOSIS — F4321 Adjustment disorder with depressed mood: Secondary | ICD-10-CM | POA: Diagnosis not present

## 2023-12-01 DIAGNOSIS — Z23 Encounter for immunization: Secondary | ICD-10-CM | POA: Diagnosis not present

## 2023-12-01 DIAGNOSIS — H409 Unspecified glaucoma: Secondary | ICD-10-CM | POA: Diagnosis not present

## 2023-12-01 DIAGNOSIS — Z1339 Encounter for screening examination for other mental health and behavioral disorders: Secondary | ICD-10-CM | POA: Diagnosis not present

## 2023-12-01 DIAGNOSIS — I700 Atherosclerosis of aorta: Secondary | ICD-10-CM | POA: Diagnosis not present

## 2023-12-01 DIAGNOSIS — Z8673 Personal history of transient ischemic attack (TIA), and cerebral infarction without residual deficits: Secondary | ICD-10-CM | POA: Diagnosis not present

## 2023-12-02 ENCOUNTER — Telehealth: Payer: Self-pay | Admitting: Cardiology

## 2023-12-02 LAB — LAB REPORT - SCANNED
A1c: 6.2
Albumin, Urine POC: 5.1
Albumin/Creatinine Ratio, Urine, POC: 5
Creatinine, POC: 103.8 mg/dL
EGFR (Non-African Amer.): 73
PSA, Total: 2

## 2023-12-02 NOTE — Telephone Encounter (Signed)
 Forwarding to Niels (pt stated she called him) in pre-op

## 2023-12-02 NOTE — Telephone Encounter (Signed)
 Patient is state he was returning call. Please advise

## 2023-12-03 ENCOUNTER — Telehealth (HOSPITAL_BASED_OUTPATIENT_CLINIC_OR_DEPARTMENT_OTHER): Payer: Self-pay | Admitting: *Deleted

## 2023-12-03 NOTE — Telephone Encounter (Signed)
 Labs from PCP received today:  Collection date 11/24/23  CBC: WBC 7.30 HGB 15.2 PLT 206  BMP:  CREATININE 1.0 K+ 4.9 BUN

## 2023-12-03 NOTE — Telephone Encounter (Addendum)
 Dr. Rosalene office called back and stated the pt had labs done and will fax these to fax# [phone] today.   Once I have these labs I will update the preop APP and preop Pharm-d.   I did leave a message for the pt that he does not need to get labs today. The labs were not all in the system which is why we could not see them at the time of the call.

## 2023-12-03 NOTE — Addendum Note (Signed)
 Addended by: WYNETTA NIELS HERO on: 12/03/2023 10:03 AM   Modules accepted: Orders

## 2023-12-03 NOTE — Telephone Encounter (Signed)
 Pt states he had labs done with PCP recently. I found the labs under the tab [facility]. I found the results though these results look to be from a UA, though pt said they did lab work also.   Pt said he will get labs today or tomorrow. Tele preop appt has been set for 12/11/23. Med rec and consent are done.   I also called PCP to see if they did labs CBC and BMET. I asked for a call back to [phone] and if labs done to be faxed to [phone].       Patient Consent for Virtual Visit        Shaun Ball has provided verbal consent on 12/03/2023 for a virtual visit (video or telephone).   CONSENT FOR VIRTUAL VISIT FOR:  Shaun Ball  By participating in this virtual visit I agree to the following:  I hereby voluntarily request, consent and authorize [facility] and its employed or contracted physicians, physician assistants, nurse practitioners or other licensed health care professionals (the Practitioner), to provide me with telemedicine health care services (the "Services) as deemed necessary by the treating Practitioner. I acknowledge and consent to receive the Services by the Practitioner via telemedicine. I understand that the telemedicine visit will involve communicating with the Practitioner through live audiovisual communication technology and the disclosure of certain medical information by electronic transmission. I acknowledge that I have been given the opportunity to request an in-person assessment or other available alternative prior to the telemedicine visit and am voluntarily participating in the telemedicine visit.  I understand that I have the right to withhold or withdraw my consent to the use of telemedicine in the course of my care at any time, without affecting my right to future care or treatment, and that the Practitioner or I may terminate the telemedicine visit at any time. I understand that I have the right to inspect all information obtained  and/or recorded in the course of the telemedicine visit and may receive copies of available information for a reasonable fee.  I understand that some of the potential risks of receiving the Services via telemedicine include:  Delay or interruption in medical evaluation due to technological equipment failure or disruption; Information transmitted may not be sufficient (e.g. poor resolution of images) to allow for appropriate medical decision making by the Practitioner; and/or  In rare instances, security protocols could fail, causing a breach of personal health information.  Furthermore, I acknowledge that it is my responsibility to provide information about my medical history, conditions and care that is complete and accurate to the best of my ability. I acknowledge that Practitioner's advice, recommendations, and/or decision may be based on factors not within their control, such as incomplete or inaccurate data provided by me or distortions of diagnostic images or specimens that may result from electronic transmissions. I understand that the practice of medicine is not an exact science and that Practitioner makes no warranties or guarantees regarding treatment outcomes. I acknowledge that a copy of this consent can be made available to me via my patient portal [facility] MyChart), or I can request a printed copy by calling the office of [facility].    I understand that my insurance will be billed for this visit.   I have read or had this consent read to me. I understand the contents of this consent, which adequately explains the benefits and risks of the Services being provided via telemedicine.  I have been  provided ample opportunity to ask questions regarding this consent and the Services and have had my questions answered to my satisfaction. I give my informed consent for the services to be provided through the use of telemedicine in my medical care

## 2023-12-03 NOTE — Telephone Encounter (Signed)
 Shaun Ball will get the labs scanned into the chart under the media tab.

## 2023-12-03 NOTE — Telephone Encounter (Signed)
 Pt states he had labs done with PCP recently. I found the labs under the tab [facility]. I found the results though these results look to be from a UA, though pt said they did lab work also.   Pt said he will get labs today or tomorrow. Tele preop appt has been set for 12/11/23. Med rec and consent are done.   I also called PCP to see if they did labs CBC and BMET. I asked for a call back to [phone] and if labs done to be faxed to [phone].

## 2023-12-03 NOTE — Telephone Encounter (Signed)
 Gretel Maeola CROME, RN routed this conversation to Me (Selected Message) Gretel Maeola CROME, RN    12/02/23  5:11 PM Note Forwarding to Niels (pt stated she called him) in pre-op      12/02/23  5:10 PM Price, Maeola CROME, RN contacted Marlise Pac T    12/02/23  4:37 PM Claudene Snide A routed this conversation to [facility] Triage  Claudene Snide LABOR SS   12/02/23  4:37 PM Note Patient is state he was returning call. Please advise         12/02/23  4:36 PM Marlise Pac DASEN contacted Claudene Snide LABOR

## 2023-12-03 NOTE — Telephone Encounter (Signed)
 Patient with diagnosis of aflutter on Eliquis  for anticoagulation.    Procedure: Hernia surgery  Date of procedure: TBD   CHA2DS2-VASc Score = 4   This indicates a 4.8% annual risk of stroke. The patient's score is based upon: CHF History: 0 HTN History: 1 Diabetes History: 0 Stroke History: 2 Vascular Disease History: 0 Age Score: 1 Gender Score: 0      CrCl 77 ml/min Platelet count 206  Patient has not had an Afib/aflutter ablation in the last 3 months, DCCV within the last 4 weeks or a watchman implanted in the last 45 days   Stroke was 5 years ago  Per office protocol, patient can hold Eliquis  for 2 days prior to procedure.    **This guidance is not considered finalized until pre-operative APP has relayed final recommendations.**

## 2023-12-11 ENCOUNTER — Ambulatory Visit: Attending: Cardiovascular Disease | Admitting: Emergency Medicine

## 2023-12-11 DIAGNOSIS — Z01810 Encounter for preprocedural cardiovascular examination: Secondary | ICD-10-CM | POA: Diagnosis not present

## 2023-12-11 NOTE — Progress Notes (Signed)
 Virtual Visit via Telephone Note   Because of [name] co-morbid illnesses, he is at least at moderate risk for complications without adequate follow up.  This format is felt to be most appropriate for this patient at this time.  Due to technical limitations with video connection web designer), today's appointment will be conducted as an audio only telehealth visit, and Cordarro Spinnato Ball verbally agreed to proceed in this manner.   All issues noted in this document were discussed and addressed.  No physical exam could be performed with this format.  Evaluation Performed:  Preoperative cardiovascular risk assessment _____________   Date:  12/11/2023   Patient ID:  Shaun Ball, DOB Jul 28, 1950, MRN [id number] Patient Location:  Home Provider location:   Office  Primary Care Provider:  Nichole Senior, MD Primary Cardiologist:  None  Chief Complaint / Patient Profile   73 y.o. y/o male with a h/o cancer, cryptogenic stroke, atrial flutter who is pending hernia surgery and presents today for telephonic preoperative cardiovascular risk assessment.  History of Present Illness    Shaun Ball is a 73 y.o. male who presents via audio/video conferencing for a telehealth visit today.  Pt was last seen in cardiology clinic on 03/10/2023 by Dr. Soyla Norton.  At that time SIDDHANT HASHEMI was doing well.  The patient is now pending procedure as outlined above. He denies chest pain, palpitations, dyspnea, orthopnea, n, v, hematuria, dizziness, syncope, edema, weight gain. Reports having some darks stools over the last month which prompted the abdominal CT, l/t the hernia diagnosis and current procedure. Recent labs at PCP: WBC 7.30, HGB 15.2, PLT 206, CREATININE 1.0, K+ 4.9. Denies any excessive/new weakness/fatigue.  Past Medical History    Past Medical History:  Diagnosis Date   Arthritis    Bladder cancer (HCC)    CVA (cerebral vascular accident) (HCC) 09/2018   History of kidney  stones 2012   passed / has renal cyst to evaluated at later time   Hypertension    Wears glasses    Past Surgical History:  Procedure Laterality Date   BIOPSY  05/04/2022   Procedure: BIOPSY;  Surgeon: Wilhelmenia Aloha Raddle., MD;  Location: [facility];  Service: Gastroenterology;;   BIOPSY  03/06/2023   Procedure: BIOPSY;  Surgeon: San Sandor GAILS, DO;  Location: [facility];  Service: Gastroenterology;;   VASSIE STUDY  10/01/2018   Procedure: BUBBLE STUDY;  Surgeon: Okey Vina GAILS, MD;  Location[facility];  Service: Cardiovascular;;   COLONOSCOPY  10/25/2007   [facility]. Polyp(s) of the right colon Minimal Hemorrhoidal disease.   CYSTOSCOPY N/A 11/05/2016   Procedure: CYSTOSCOPY;  Surgeon: Devere Lonni Righter, MD;  Location: [facility];  Service: Urology;  Laterality: N/A;   CYSTOSCOPY WITH BIOPSY Bilateral 10/24/2016   Procedure: CYSTOSCOPY WITH BLADDER BIOPSY/ BILATERAL RETROGRADE PYLOGRAM;  Surgeon: Devere Lonni Righter, MD;  Location: [facility];  Service: Urology;  Laterality: Bilateral;   CYSTOSCOPY WITH BIOPSY N/A 05/12/2018   Procedure: CYSTOSCOPY WITH BLADDER BIOPSY/ BILATERAL RETROGRADE PYELOGRAM BIOPSY;  Surgeon: Devere Lonni Righter, MD;  Location: [facility];  Service: Urology;  Laterality: N/A;   ESOPHAGOGASTRODUODENOSCOPY N/A 05/04/2022   Procedure: ESOPHAGOGASTRODUODENOSCOPY (EGD);  Surgeon: Wilhelmenia Aloha Raddle., MD;  Location: [facility];  Service: Gastroenterology;  Laterality: N/A;   ESOPHAGOGASTRODUODENOSCOPY N/A 03/06/2023   Procedure: ESOPHAGOGASTRODUODENOSCOPY (EGD);  Surgeon: San Sandor GAILS, DO;  Location: [facility];  Service: Gastroenterology;  Laterality: N/A;  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 left 03-25-2006;  right 07-07-2006   and Gastroc Slide   fattytumorremoved from back      FOREIGN BODY REMOVAL  05/04/2022   Procedure:  FOREIGN BODY REMOVAL;  Surgeon: Wilhelmenia Aloha Raddle., MD;  Location: [facility];  Service: Gastroenterology;;   FOREIGN BODY REMOVAL  03/06/2023   Procedure: FOREIGN BODY REMOVAL;  Surgeon: San Sandor GAILS, DO;  Location: [facility];  Service: Gastroenterology;;   LOOP RECORDER INSERTION N/A 10/01/2018   Procedure: LOOP RECORDER INSERTION;  Surgeon: Inocencio Soyla Lunger, MD;  Location: [facility];  Service: Cardiovascular;  Laterality: N/A;   TEE WITHOUT CARDIOVERSION N/A 10/01/2018   Procedure: TRANSESOPHAGEAL ECHOCARDIOGRAM (TEE);  Surgeon: Okey Vina GAILS, MD;  Location[facility];  Service: Cardiovascular;  Laterality: N/A;   TRANSURETHRAL RESECTION OF BLADDER TUMOR N/A 11/05/2016   Procedure: TRANSURETHRAL RESECTION OF BLADDER TUMOR (TURBT);  Surgeon: Devere Lonni Righter, MD;  Location: [facility];  Service: Urology;  Laterality: N/A;    Allergies  Allergies  Allergen Reactions   Statins Hives and Rash    whelps,liver enzymes elevated   Iohexol  Other (See Comments)    Pt had sneezing after his CT with IV contrast, lasting only 10- 15 minutes, no intervention was needed, per radiologist  Per Radiologist for outpatient scan on 10/23/23 pre-medication needed, any questions call Rad, Dr Landy @ [phone] [facility])    Home Medications    Prior to Admission medications   Medication Sig Start Date End Date Taking? Authorizing Provider  acetaminophen  (TYLENOL ) 500 MG tablet Take 500 mg by mouth every 6 (six) hours as needed for moderate pain.    [provider]  apixaban  (ELIQUIS ) 5 MG TABS tablet Take 1 tablet (5 mg total) by mouth 2 (two) times daily. 05/05/22   Mansouraty, Gabriel Jr., MD  diphenhydrAMINE (BENADRYL) 25 mg capsule Take 25 mg by mouth every 6 (six) hours as needed for allergies.    [provider]  DULoxetine (CYMBALTA) 30 MG capsule Take 30 mg by mouth daily. Patient not taking: Reported on 12/03/2023    [provider]  fluticasone  (FLONASE ) 50 MCG/ACT nasal spray Place 1 spray into both nostrils as needed for allergies or rhinitis.    [provider]  lisinopril  (PRINIVIL ,ZESTRIL ) 40 MG tablet Take 40 mg by mouth every evening.     [provider]  Melatonin 5 MG TABS Take 5 mg by mouth at bedtime.    [provider]  montelukast  (SINGULAIR ) 10 MG tablet Take 10 mg by mouth daily as needed (allergies).  07/14/17   [provider]  oxybutynin  (DITROPAN -XL) 5 MG 24 hr tablet Take 5 mg by mouth daily. Patient not taking: Reported on 12/03/2023 05/18/22   [provider]  pantoprazole  (PROTONIX ) 40 MG tablet Take 1 tablet (40 mg total) by mouth 2 (two) times daily before a meal. Patient needs follow up appointment for future refills. Please call [phone] to schedule an appointment. 11/30/23   Charlanne Groom, MD  REPATHA SURECLICK 140 MG/ML SOAJ Inject as directed every 14 (fourteen) days. 12/01/19   [provider]  sildenafil (VIAGRA) 100 MG tablet Take 100 mg by mouth daily as needed for erectile dysfunction.    [provider]  sucralfate  (CARAFATE ) 1 GM/10ML suspension Take 10 mLs (1 g total) by mouth 4 (four) times daily -  with meals and at bedtime. Patient not taking: Reported on 12/03/2023 03/06/23   Cirigliano, Vito  V, DO  tamsulosin  (FLOMAX ) 0.4 MG CAPS capsule Take 1 capsule (0.4 mg total) by mouth daily. 10/24/16   Devere Lonni Righter, MD    Physical Exam    Vital Signs:  EAMES DIBIASIO does not have vital signs available for review today.  Given telephonic nature of communication, physical exam is limited. AAOx3. NAD. Normal affect.  Speech and respirations are unlabored.  Accessory Clinical Findings    None  Assessment & Plan    1.  Preoperative Cardiovascular Risk Assessment: According to the Revised Cardiac Risk Index (RCRI), his Perioperative Risk of Major Cardiac Event is (%): 6.6  His Functional Capacity  in METs is: 6.61 according to the Duke Activity Status Index (DASI). Therefore, based on ACC/AHA guidelines, patient would be at acceptable risk for the planned procedure without further cardiovascular testing. Advised patient that if dark stools persist following his hernia surgery he will need to follow up on this.   The patient was advised that if he develops new symptoms prior to surgery to contact our office to arrange for a follow-up visit, and he verbalized understanding.  Per office protocol, patient can hold Eliquis  for 2 days prior to procedure.   A copy of this note will be routed to requesting surgeon.  Time:   Today, I have spent 10 minutes with the patient with telehealth technology discussing medical history, symptoms, and management plan.     Arlyce Circle E Adham Johnson, NP  12/11/2023, 8:28 AM

## 2023-12-31 NOTE — Progress Notes (Signed)
 Surgical Instructions   Your procedure is scheduled on Tuesday, November 25th. Report to [facility] Main Entrance A at 5:30 A.M., then check in with the Admitting office. Any questions or running late day of surgery: call [phone]  Questions prior to your surgery date: call [phone], Monday-Friday, 8am-4pm. If you experience any cold or flu symptoms such as cough, fever, chills, shortness of breath, etc. between now and your scheduled surgery, please notify us  at the above number.     Remember:  Do not eat after midnight the night before your surgery   You may drink clear liquids until 4:30 the morning of your surgery.   Clear liquids allowed are: Water , Non-Citrus Juices (without pulp), Carbonated Beverages, Clear Tea (no milk, honey, etc.), Black Coffee Only (NO MILK, CREAM OR POWDERED CREAMER of any kind), and Gatorade.    Take these medicines the morning of surgery with A SIP OF WATER   oxybutynin  (DITROPAN -XL)  pantoprazole  (PROTONIX )  tamsulosin  (FLOMAX )   May take these medicines IF NEEDED: acetaminophen  (TYLENOL )  fluticasone  (FLONASE )    Per your cardiologist's instruction, HOLD your apixaban  (ELIQUIS ) for 2 day's prior to surgery.  Last dose on Saturday, November 22nd   One week prior to surgery, STOP taking any Aspirin  (unless otherwise instructed by your surgeon) Aleve, Naproxen, Ibuprofen, Motrin, Advil, Goody's, BC's, all herbal medications, fish oil, and non-prescription vitamins.                     Do NOT Smoke (Tobacco/Vaping) for 24 hours prior to your procedure.  If you use a CPAP at night, you may bring your mask/headgear for your overnight stay.   You will be asked to remove any contacts, glasses, piercing's, hearing aid's, dentures/partials prior to surgery. Please bring cases for these items if needed.    Patients discharged the day of surgery will not be allowed to drive home, and someone needs to stay with them for 24 hours.  SURGICAL  WAITING ROOM VISITATION Patients may have no more than 2 support people in the waiting area - these visitors may rotate.   Pre-op nurse will coordinate an appropriate time for 1 ADULT support person, who may not rotate, to accompany patient in pre-op.  Children under the age of 70 must have an adult with them who is not the patient and must remain in the main waiting area with an adult.  If the patient needs to stay at the hospital during part of their recovery, the visitor guidelines for inpatient rooms apply.  Please refer to the [facility] website for the visitor guidelines for any additional information.   If you received a COVID test during your pre-op visit  it is requested that you wear a mask when out in public, stay away from anyone that may not be feeling well and notify your surgeon if you develop symptoms. If you have been in contact with anyone that has tested positive in the last 10 days please notify you surgeon.      Pre-operative CHG Bathing Instructions   You can play a key role in reducing the risk of infection after surgery. Your skin needs to be as free of germs as possible. You can reduce the number of germs on your skin by washing with CHG (chlorhexidine  gluconate) soap before surgery. CHG is an antiseptic soap that kills germs and continues to kill germs even after washing.   DO NOT use if you have an allergy to chlorhexidine /CHG or antibacterial soaps.  If your skin becomes reddened or irritated, stop using the CHG and notify one of our RNs at [phone].              TAKE A SHOWER THE NIGHT BEFORE SURGERY   Please keep in mind the following:  DO NOT shave, including legs and underarms, 48 hours prior to surgery.   You may shave your face before/day of surgery.  Place clean sheets on your bed the night before surgery Use a clean washcloth (not used since being washed) for shower. DO NOT sleep with pet's night before surgery.  CHG Shower Instructions:  Wash  your face and private area with normal soap. If you choose to wash your hair, wash first with your normal shampoo.  After you use shampoo/soap, rinse your hair and body thoroughly to remove shampoo/soap residue.  Turn the water  OFF and apply half the bottle of CHG soap to a CLEAN washcloth.  Apply CHG soap ONLY FROM YOUR NECK DOWN TO YOUR TOES (washing for 3-5 minutes)  DO NOT use CHG soap on face, private areas, open wounds, or sores.  Pay special attention to the area where your surgery is being performed.  If you are having back surgery, having someone wash your back for you may be helpful. Wait 2 minutes after CHG soap is applied, then you may rinse off the CHG soap.  Pat dry with a clean towel  Put on clean pajamas    Additional instructions for the day of surgery: If you choose, you may shower the morning of surgery with an antibacterial soap.  DO NOT APPLY any lotions, deodorants, or cologne.   Do not wear jewelry Do not bring valuables to the hospital. [facility] is not responsible for valuables/personal belongings. Put on clean/comfortable clothes.  Please brush your teeth.  Ask your nurse before applying any prescription medications to the skin.

## 2024-01-01 ENCOUNTER — Encounter (HOSPITAL_COMMUNITY)
Admission: RE | Admit: 2024-01-01 | Discharge: 2024-01-01 | Disposition: A | Discharge status: Home / Self Care | Source: Ambulatory Visit | Attending: General Surgery | Admitting: General Surgery

## 2024-01-01 ENCOUNTER — Encounter (HOSPITAL_COMMUNITY): Payer: Self-pay

## 2024-01-01 ENCOUNTER — Other Ambulatory Visit: Payer: Self-pay

## 2024-01-01 VITALS — BP 146/68 | HR 98 | Temp 97.9°F | Resp 18 | Ht 71.0 in | Wt 190.8 lb

## 2024-01-01 DIAGNOSIS — Z8673 Personal history of transient ischemic attack (TIA), and cerebral infarction without residual deficits: Secondary | ICD-10-CM | POA: Diagnosis not present

## 2024-01-01 DIAGNOSIS — I4892 Unspecified atrial flutter: Secondary | ICD-10-CM | POA: Insufficient documentation

## 2024-01-01 DIAGNOSIS — K219 Gastro-esophageal reflux disease without esophagitis: Secondary | ICD-10-CM | POA: Insufficient documentation

## 2024-01-01 DIAGNOSIS — Z8551 Personal history of malignant neoplasm of bladder: Secondary | ICD-10-CM | POA: Diagnosis not present

## 2024-01-01 DIAGNOSIS — Z87891 Personal history of nicotine dependence: Secondary | ICD-10-CM | POA: Diagnosis not present

## 2024-01-01 DIAGNOSIS — E785 Hyperlipidemia, unspecified: Secondary | ICD-10-CM | POA: Diagnosis not present

## 2024-01-01 DIAGNOSIS — K469 Unspecified abdominal hernia without obstruction or gangrene: Secondary | ICD-10-CM | POA: Insufficient documentation

## 2024-01-01 DIAGNOSIS — Z95811 Presence of heart assist device: Secondary | ICD-10-CM | POA: Insufficient documentation

## 2024-01-01 DIAGNOSIS — Z7901 Long term (current) use of anticoagulants: Secondary | ICD-10-CM | POA: Diagnosis not present

## 2024-01-01 DIAGNOSIS — Z01812 Encounter for preprocedural laboratory examination: Secondary | ICD-10-CM | POA: Diagnosis present

## 2024-01-01 DIAGNOSIS — Z01818 Encounter for other preprocedural examination: Secondary | ICD-10-CM

## 2024-01-01 DIAGNOSIS — I10 Essential (primary) hypertension: Secondary | ICD-10-CM | POA: Diagnosis not present

## 2024-01-01 DIAGNOSIS — Z79899 Other long term (current) drug therapy: Secondary | ICD-10-CM | POA: Insufficient documentation

## 2024-01-01 LAB — CBC
HCT: 49.3 % (ref 39.0–52.0)
Hemoglobin: 16.1 g/dL (ref 13.0–17.0)
MCH: 30.6 pg (ref 26.0–34.0)
MCHC: 32.7 g/dL (ref 30.0–36.0)
MCV: 93.5 fL (ref 80.0–100.0)
Platelets: 185 K/uL (ref 150–400)
RBC: 5.27 MIL/uL (ref 4.22–5.81)
RDW: 12.9 % (ref 11.5–15.5)
WBC: 7.1 K/uL (ref 4.0–10.5)
nRBC: 0 % (ref 0.0–0.2)

## 2024-01-01 LAB — BASIC METABOLIC PANEL WITH GFR
Anion gap: 10 (ref 5–15)
BUN: 14 mg/dL (ref 8–23)
CO2: 26 mmol/L (ref 22–32)
Calcium: 9.2 mg/dL (ref 8.9–10.3)
Chloride: 102 mmol/L (ref 98–111)
Creatinine, Ser: 1 mg/dL (ref 0.61–1.24)
GFR, Estimated: >60 mL/min (ref >60)
Glucose, Bld: 139 mg/dL — ABNORMAL HIGH (ref 70–99)
Potassium: 4.1 mmol/L (ref 3.5–5.1)
Sodium: 138 mmol/L (ref 135–145)

## 2024-01-01 NOTE — Progress Notes (Signed)
 PCP - Garnette Ore, MD Cardiologist - Elsie Norton, MD clearance on 12-11-23   PPM/ICD - Medtronic loop recorder placed 10-01-18 Device Orders - Received and in chart Rep Notified - n/a  Chest x-ray - N/A EKG - 03-10-23 Stress Test - 11-05-18 ECHO - 10-01-18 Cardiac Cath - denies  Sleep Study - denies CPAP - n/a  NON-diabetic  Last dose of GLP1 agonist-  Denies GLP1 instructions: N/A  Blood Thinner Instructions: apixaban  (ELIQUIS ) , hold 2 days; LD 01/02/24 Aspirin  Instructions: Denies  ERAS Protcol - Clears until 0430 PRE-SURGERY Ensure or G2- none  COVID TEST- N/A   Anesthesia review: Yes, cardiac clearance, HTN, CVA, Bladder Cancer  Patient denies shortness of breath, fever, cough and chest pain at PAT appointment. Patient denies any respiratory issues at this time.   All instructions explained to the patient, with a verbal understanding of the material. Patient agrees to go over the instructions while at home for a better understanding. Patient also instructed to self quarantine after being tested for COVID-19. The opportunity to ask questions was provided.

## 2024-01-04 ENCOUNTER — Encounter (HOSPITAL_COMMUNITY): Payer: Self-pay | Admitting: General Surgery

## 2024-01-04 NOTE — H&P (Signed)
 History of Present Illness: Shaun Ball is a 73 y.o. male who is seen today as an office consultation at the request of Dr. Nichole for evaluation of New Consultation ( moderate left ing hernia containging fat) .   History of Present Illness Shaun Ball is a 73 year old male with atrial fibrillation and prior strokes who presents with discomfort in the lower abdomen due to a hernia. He was referred by Dr. Nichole for evaluation of a hernia.  He experiences intermittent discomfort in the lower abdomen on the left side. A CT scan confirmed a hernia on the left side. He recalls a potential issue in the lower groin area noted during an MRI in 2020. He remains active, engaging in activities such as riding a motorcycle and wood splitting, which involve lifting. He prefers to address the hernia now rather than later. He inquires about the hernia's potential impact on erections, noting that pain could affect this aspect of his life.  He is on Eliquis  for atrial fibrillation. He experienced multiple strokes in 2020, initially causing mobility concerns. He has a monitoring chip for his heart condition but no longer follows with a cardiologist as he feels well and is not short of breath. He experiences occasional minor chest pain and back aching.     Review of Systems: A complete review of systems was obtained from the patient.  I have reviewed this information and discussed as appropriate with the patient.  See HPI as well for other ROS.  Review of Systems  Constitutional:  Negative for fever.  HENT:  Negative for congestion.   Eyes:  Negative for blurred vision.  Respiratory:  Negative for cough, shortness of breath and wheezing.   Cardiovascular:  Negative for chest pain and palpitations.  Gastrointestinal:  Negative for heartburn.  Genitourinary:  Negative for dysuria.  Musculoskeletal:  Negative for myalgias.  Skin:  Negative for rash.  Neurological:  Negative for dizziness and headaches.   Psychiatric/Behavioral:  Negative for depression and suicidal ideas.   All other systems reviewed and are negative.     Medical History: Past Medical History: Diagnosis Date  Arthritis   Bladder cancer (CMS/HHS-HCC)   CVA (cerebral vascular accident) (CMS/HHS-HCC) 09/2018  History of kidney stones 2012  Hypertension    There is no problem list on file for this patient.   Past Surgical History: Procedure Laterality Date  Excision haglund's deformity with achilles tendon repair  Left 03/25/2006  Excision haglund's deformity with achilles tendon repair  Right 07/07/2006  COLONOSCOPY  10/25/2007  Cystoscopy with biopsy  Bilateral 10/24/2016  Transurethral resection of bladder tumor  11/05/2016  CYSTOSCOPY  11/05/2016  Cystoscopy with biopsy   05/12/2018  Tee without cardioversion   10/01/2018   Bubble study   10/01/2018  Loop recorder insertion   10/01/2018  ESOPHAGOGASTRODUODENOSCOPY, FLEXIBLE, TRANSORAL FOR BARIATRIC BALLOON ADJUSTMENT  05/04/2022  ESOPHAGOGASTRODUODENOSCOPY, FLEXIBLE, TRANSORAL FOR BARIATRIC BALLOON ADJUSTMENT  03/06/2023  fatty tumor removed from back      Allergies Allergen Reactions  Statins-Hmg-Coa Reductase Inhibitors Hives and Rash   whelps,liver enzymes elevated  Ezetimibe  Itching  Iohexol  Other (See Comments)   Pt had sneezing after his CT with IV contrast, lasting only 10- 15 minutes, no intervention was needed, per radiologist   Current Outpatient Medications on File Prior to Visit Medication Sig Dispense Refill  apixaban  (ELIQUIS ) 5 mg tablet Take 5 mg by mouth 2 (two) times daily    DULoxetine (CYMBALTA) 30 MG DR capsule Take 30 mg by mouth  once daily    fluticasone  propionate (FLONASE ) 50 mcg/actuation nasal spray Place 1 spray into one nostril as needed    lisinopriL  (ZESTRIL ) 40 MG tablet Take 40 mg by mouth every evening    melatonin-pyridoxine, vit B6, (MELATONIN, WITH B6,) 5-1 mg Tab Take 5 mg by mouth at bedtime    montelukast   (SINGULAIR ) 10 mg tablet Take 10 mg by mouth once daily    pantoprazole  (PROTONIX ) 40 MG DR tablet Take 40 mg by mouth 2 (two) times daily before meals    REPATHA SURECLICK 140 mg/mL PnIj INJECT 1 SUBCUTANEOUSLY EVERY OTHER WEEK FOR 28 DAYS    sildenafiL (VIAGRA) 100 MG tablet Take 100 mg by mouth once daily as needed for Erectile Dysfunction    sucralfate  (CARAFATE ) 100 mg/mL suspension Take 1 g by mouth 4 (four) times daily    No current facility-administered medications on file prior to visit.   Family History Problem Relation Age of Onset  Blindness Mother   Cataracts Mother   Glaucoma Mother   Macular degeneration Mother   Diabetes Mother   Cataracts Father   Diabetes Father   Diabetes Sister   Diabetes Brother   Blindness Maternal Uncle   Macular degeneration Maternal Uncle   Macular degeneration Paternal Aunt   Amblyopia Neg Hx   Retinal detachment Neg Hx   Strabismus Neg Hx   Retinitis pigmentosa Neg Hx     Social History  Tobacco Use Smoking Status Former  Types: Cigarettes  Passive exposure: Never Smokeless Tobacco Never    Social History  Socioeconomic History  Marital status: Legally Separated Tobacco Use  Smoking status: Former   Types: Cigarettes   Passive exposure: Never  Smokeless tobacco: Never Substance and Sexual Activity  Alcohol  use: Defer  Drug use: Defer  Sexual activity: Defer  Social Drivers of Health  Financial Resource Strain: Low Risk  (10/06/2018)  Received from [facility]  Overall Financial Resource Strain (CARDIA)   Difficulty of Paying Living Expenses: Not hard at all Food Insecurity: No Food Insecurity (10/06/2018)  [facility]  Hunger Vital Sign   Within the past 12 months, you worried that your food would run out before you got the money to buy more.: Never true   Within the past 12 months, the food you bought just didn't last and you didn't have money to get more.: Never true Transportation Needs: No  Transportation Needs (10/06/2018)  [facility] - [facility]   Lack of Transportation (Medical): No   Lack of Transportation (Non-Medical): No Physical Activity: Inactive (10/06/2018)  Received from [facility]  Exercise Vital Sign   On average, how many days per week do you engage in moderate to strenuous exercise (like a brisk walk)?: 0 days   On average, how many minutes do you engage in exercise at this level?: 0 min Stress: No Stress Concern Present (10/06/2018)  Received from [facility] Occupational [facility] - Occupational Stress Questionnaire   Feeling of Stress : Not at all Housing Stability: Unknown (11/25/2023)  Housing Stability Vital Sign   Homeless in the Last Year: No   Objective:   Vitals:  11/25/23 1129 BP: 134/75 Pulse: 92 Temp: 36.9 C (98.4 F) TempSrc: Temporal SpO2: 97% Weight: 87.2 kg (192 lb 3.2 oz) Height: 180.3 cm (5' 11) PainSc: 0-No pain   Body mass index is 26.81 kg/m. Physical Exam Constitutional:      Appearance: Normal appearance.  HENT:  Head: Normocephalic and atraumatic.     Nose: Nose normal. No congestion.     Mouth/Throat:     Mouth: Mucous membranes are moist.     Pharynx: Oropharynx is clear.  Eyes:     Pupils: Pupils are equal, round, and reactive to light.  Cardiovascular:     Rate and Rhythm: Normal rate and regular rhythm.     Pulses: Normal pulses.     Heart sounds: Normal heart sounds. No murmur heard.    No friction rub. No gallop.  Pulmonary:     Effort: Pulmonary effort is normal. No respiratory distress.     Breath sounds: Normal breath sounds. No stridor. No wheezing, rhonchi or rales.  Abdominal:     General: Abdomen is flat.     Hernia: A hernia is present. Hernia is present in the left inguinal area. There is no hernia in the right inguinal area.  Musculoskeletal:        General: Normal range of motion.     Cervical back: Normal range of motion.  Skin:     General: Skin is warm and dry.  Neurological:     General: No focal deficit present.     Mental Status: He is alert and oriented to person, place, and time.  Psychiatric:        Mood and Affect: Mood normal.        Thought Content: Thought content normal.       Assessment and Plan: Diagnoses and all orders for this visit:  Unilateral inguinal hernia without obstruction or gangrene, recurrence not specified    Shaun Ball is a 73 y.o. male   1.  We will proceed to the OR for a LAP LEFT inguinal hernia repair with mesh. 2. All risks and benefits were discussed with the patient, to generally include infection, bleeding, damage to surrounding structures, acute and chronic nerve pain, and recurrence. Alternatives were offered and described.  All questions were answered and the patient voiced understanding of the procedure and wishes to proceed at this point.       No follow-ups on file.  Lynda Leos, MD, [facility], [place] [facility]

## 2024-01-04 NOTE — Anesthesia Preprocedure Evaluation (Signed)
 Anesthesia Evaluation  Patient identified by MRN, date of birth, ID band Patient awake    Reviewed: Allergy & Precautions, NPO status , Patient's Chart, lab work & pertinent test results  History of Anesthesia Complications (+) PONV and history of anesthetic complications  Airway Mallampati: II  TM Distance: >3 FB Neck ROM: Full    Dental  (+) Teeth Intact, Dental Advisory Given   Pulmonary former smoker   breath sounds clear to auscultation       Cardiovascular hypertension, Pt. on medications  Rhythm:Regular Rate:Normal     Neuro/Psych  PSYCHIATRIC DISORDERS      CVA    GI/Hepatic Neg liver ROS,GERD  Medicated,,  Endo/Other  negative endocrine ROS    Renal/GU negative Renal ROS     Musculoskeletal  (+) Arthritis ,    Abdominal   Peds  Hematology negative hematology ROS (+)   Anesthesia Other Findings   Reproductive/Obstetrics                              Anesthesia Physical Anesthesia Plan  ASA: 3  Anesthesia Plan: General   Post-op Pain Management: Tylenol  PO (pre-op)* and Toradol  IV (intra-op)*   Induction: Intravenous  PONV Risk Score and Plan: 4 or greater and Ondansetron , Dexamethasone , Midazolam  and Scopolamine  patch - Pre-op  Airway Management Planned: Oral ETT  Additional Equipment: None  Intra-op Plan:   Post-operative Plan: Extubation in OR  Informed Consent: I have reviewed the patients History and Physical, chart, labs and discussed the procedure including the risks, benefits and alternatives for the proposed anesthesia with the patient or authorized representative who has indicated his/her understanding and acceptance.     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  TEE 09/2018 showed normal biventricular function, no PFO, no significant valvular abnormalities.  Nuclear stress 10/2018 was low risk.  Seen by Miriam Shams, NP on 12/11/2023 for preop evaluation.  Per note, According to the Revised Cardiac Risk Index (RCRI), his Perioperative Risk of Major Cardiac Event is (%): 6.6. His Functional Capacity in METs is: 6.61 according to the Duke Activity Status Index (DASI). Therefore, based on ACC/AHA guidelines, patient would be at acceptable risk for the planned procedure without further cardiovascular testing. Advised patient that if dark stools persist following his hernia surgery he will need to follow up on this. The patient was advised that if he develops new symptoms prior to surgery to contact our office to arrange for a follow-up visit, and he verbalized understanding. Per office protocol, patient can hold Eliquis  for 2 days prior to procedure.  Other pertinent history includes former smoker (quit 2001), HTN, HLD, bladder cancer, GERD on PPI.  Patient reports last dose of Eliquis  01/02/2024.  Preop labs reviewed, unremarkable.  EKG 03/10/2023: Sinus bradycardia.  Rate 59.  Nuclear stress test 11/05/2018:  Nuclear stress EF: 62%.  There was no ST segment deviation noted during stress.  No T wave inversion was noted during stress.  The study is normal.  This is a low risk study.   Low risk stress nuclear study with normal perfusion and normal left ventricular regional and global systolic function.  TEE 10/01/2018: 1. The right ventricle has normal systolc function.  2. No evidence of a thrombus present in the left atrial appendage.  3. No PFO by color doppler or as tested by injection of agitated saline  4. The aortic valve is tricuspid.  5. Mild fixed plaquing in thoracic aorta.  6. The left ventricle has normal systolic function, with an ejection  fraction of 55-60%.   )         Anesthesia Quick Evaluation

## 2024-01-04 NOTE — Progress Notes (Signed)
 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  TEE 09/2018 showed normal biventricular function, no PFO, no significant valvular abnormalities.  Nuclear stress 10/2018 was low risk.  Seen by Miriam Shams, NP on 12/11/2023 for preop evaluation.  Per note, According to the Revised Cardiac Risk Index (RCRI), his Perioperative Risk of Major Cardiac Event is (%): 6.6. His Functional Capacity in METs is: 6.61 according to the Duke Activity Status Index (DASI). Therefore, based on ACC/AHA guidelines, patient would be at acceptable risk for the planned procedure without further cardiovascular testing. Advised patient that if dark stools persist following his hernia surgery he will need to follow up on this. The patient was advised that if he develops new symptoms prior to surgery to contact our office to arrange for a follow-up visit, and he verbalized understanding. Per office protocol, patient can hold Eliquis  for 2 days prior to procedure.  Other pertinent history includes former smoker (quit 2001), HTN, HLD, bladder cancer, GERD on PPI.  Patient reports last dose of Eliquis  01/02/2024.  Preop labs reviewed, unremarkable.  EKG 03/10/2023: Sinus bradycardia.  Rate 59.  Nuclear stress test 11/05/2018: Nuclear stress EF: 62%. There was no ST segment deviation noted during stress. No T wave inversion was noted during stress. The study is normal. This is a low risk study.   Low risk stress nuclear study with normal perfusion and normal left ventricular regional and global systolic function.  TEE 10/01/2018: 1. The right ventricle has normal systolc function.   2. No evidence of a thrombus present in the left atrial appendage.   3. No PFO by color doppler or as tested by injection of agitated saline   4. The aortic valve is tricuspid.   5. Mild fixed plaquing in thoracic aorta.   6. The left  ventricle has normal systolic function, with an ejection  fraction of 55-60%.      Lynwood Geofm RIGGERS [facility] Short Stay Center/Anesthesiology Phone [phone] 01/04/2024 9:16 AM

## 2024-01-05 ENCOUNTER — Ambulatory Visit (HOSPITAL_COMMUNITY)

## 2024-01-05 ENCOUNTER — Ambulatory Visit (HOSPITAL_COMMUNITY)
Admission: RE | Admit: 2024-01-05 | Discharge: 2024-01-05 | Disposition: A | Discharge status: Home / Self Care | Attending: General Surgery | Admitting: General Surgery

## 2024-01-05 ENCOUNTER — Encounter (HOSPITAL_COMMUNITY)
Admission: RE | Disposition: A | Discharge status: Home / Self Care | Payer: Self-pay | Source: Home / Self Care | Attending: General Surgery

## 2024-01-05 ENCOUNTER — Ambulatory Visit (HOSPITAL_COMMUNITY): Payer: Self-pay | Admitting: Physician Assistant

## 2024-01-05 ENCOUNTER — Encounter (HOSPITAL_COMMUNITY): Payer: Self-pay | Admitting: General Surgery

## 2024-01-05 ENCOUNTER — Other Ambulatory Visit: Payer: Self-pay

## 2024-01-05 DIAGNOSIS — Z7901 Long term (current) use of anticoagulants: Secondary | ICD-10-CM | POA: Diagnosis not present

## 2024-01-05 DIAGNOSIS — K219 Gastro-esophageal reflux disease without esophagitis: Secondary | ICD-10-CM | POA: Diagnosis not present

## 2024-01-05 DIAGNOSIS — K4090 Unilateral inguinal hernia, without obstruction or gangrene, not specified as recurrent: Secondary | ICD-10-CM | POA: Insufficient documentation

## 2024-01-05 DIAGNOSIS — Z87891 Personal history of nicotine dependence: Secondary | ICD-10-CM | POA: Insufficient documentation

## 2024-01-05 DIAGNOSIS — Z79899 Other long term (current) drug therapy: Secondary | ICD-10-CM | POA: Insufficient documentation

## 2024-01-05 DIAGNOSIS — I4891 Unspecified atrial fibrillation: Secondary | ICD-10-CM | POA: Diagnosis not present

## 2024-01-05 DIAGNOSIS — I10 Essential (primary) hypertension: Secondary | ICD-10-CM

## 2024-01-05 DIAGNOSIS — Z8673 Personal history of transient ischemic attack (TIA), and cerebral infarction without residual deficits: Secondary | ICD-10-CM | POA: Diagnosis not present

## 2024-01-05 HISTORY — PX: INGUINAL HERNIA REPAIR: SHX194

## 2024-01-05 SURGERY — REPAIR, HERNIA, INGUINAL, LAPAROSCOPIC
Anesthesia: General | Laterality: Left

## 2024-01-05 MED ORDER — FENTANYL CITRATE (PF) 100 MCG/2ML IJ SOLN: 25.0000 ug | INTRAMUSCULAR | Status: DC | PRN

## 2024-01-05 MED ORDER — CHLORHEXIDINE GLUCONATE CLOTH 2 % EX PADS: 6.0000 | MEDICATED_PAD | Freq: Once | CUTANEOUS | Status: DC

## 2024-01-05 MED ORDER — BUPIVACAINE HCL 0.25 % IJ SOLN
INTRAMUSCULAR | Status: DC | PRN
  Administered 2024-01-05: 6 mL

## 2024-01-05 MED ORDER — DROPERIDOL 2.5 MG/ML IJ SOLN: 0.6250 mg | Freq: Once | INTRAMUSCULAR | Status: DC | PRN

## 2024-01-05 MED ORDER — OXYCODONE HCL 5 MG PO TABS: 5.0000 mg | ORAL_TABLET | Freq: Once | ORAL | Status: DC | PRN

## 2024-01-05 MED ORDER — ROCURONIUM BROMIDE 10 MG/ML (PF) SYRINGE
PREFILLED_SYRINGE | INTRAVENOUS | Status: AC
  Filled 2024-01-05: qty 10

## 2024-01-05 MED ORDER — TRAMADOL HCL 50 MG PO TABS: 50.0000 mg | ORAL_TABLET | Freq: Four times a day (QID) | ORAL | 0 refills | Status: AC | PRN

## 2024-01-05 MED ORDER — FENTANYL CITRATE (PF) 100 MCG/2ML IJ SOLN
INTRAMUSCULAR | Status: AC
  Filled 2024-01-05: qty 2

## 2024-01-05 MED ORDER — LIDOCAINE 2% (20 MG/ML) 5 ML SYRINGE
INTRAMUSCULAR | Status: AC
  Filled 2024-01-05: qty 5

## 2024-01-05 MED ORDER — ACETAMINOPHEN 500 MG PO TABS
1000.0000 mg | ORAL_TABLET | ORAL | Status: AC
  Administered 2024-01-05: 1000 mg via ORAL
  Filled 2024-01-05: qty 2

## 2024-01-05 MED ORDER — ENSURE PRE-SURGERY PO LIQD: 296.0000 mL | Freq: Once | ORAL | Status: DC

## 2024-01-05 MED ORDER — ACETAMINOPHEN 10 MG/ML IV SOLN: 1000.0000 mg | Freq: Once | INTRAVENOUS | Status: DC | PRN

## 2024-01-05 MED ORDER — DEXAMETHASONE SOD PHOSPHATE PF 10 MG/ML IJ SOLN
INTRAMUSCULAR | Status: DC | PRN
  Administered 2024-01-05: 10 mg via INTRAVENOUS

## 2024-01-05 MED ORDER — PHENYLEPHRINE 80 MCG/ML (10ML) SYRINGE FOR IV PUSH (FOR BLOOD PRESSURE SUPPORT)
PREFILLED_SYRINGE | INTRAVENOUS | Status: DC | PRN
  Administered 2024-01-05: 80 ug via INTRAVENOUS

## 2024-01-05 MED ORDER — CHLORHEXIDINE GLUCONATE 0.12 % MT SOLN
15.0000 mL | Freq: Once | OROMUCOSAL | Status: AC
  Administered 2024-01-05: 15 mL via OROMUCOSAL
  Filled 2024-01-05: qty 15

## 2024-01-05 MED ORDER — PROPOFOL 10 MG/ML IV BOLUS
INTRAVENOUS | Status: AC
  Filled 2024-01-05: qty 20

## 2024-01-05 MED ORDER — ROCURONIUM BROMIDE 10 MG/ML (PF) SYRINGE
PREFILLED_SYRINGE | INTRAVENOUS | Status: DC | PRN
  Administered 2024-01-05: 50 mg via INTRAVENOUS

## 2024-01-05 MED ORDER — OXYCODONE HCL 5 MG/5ML PO SOLN: 5.0000 mg | Freq: Once | ORAL | Status: DC | PRN

## 2024-01-05 MED ORDER — LIDOCAINE 2% (20 MG/ML) 5 ML SYRINGE
INTRAMUSCULAR | Status: DC | PRN
  Administered 2024-01-05: 60 mg via INTRAVENOUS
  Administered 2024-01-05: 40 mg via INTRAVENOUS

## 2024-01-05 MED ORDER — PROPOFOL 500 MG/50ML IV EMUL
INTRAVENOUS | Status: DC | PRN
  Administered 2024-01-05: 150 ug/kg/min via INTRAVENOUS

## 2024-01-05 MED ORDER — PHENYLEPHRINE 80 MCG/ML (10ML) SYRINGE FOR IV PUSH (FOR BLOOD PRESSURE SUPPORT)
PREFILLED_SYRINGE | INTRAVENOUS | Status: AC
  Filled 2024-01-05: qty 10

## 2024-01-05 MED ORDER — BUPIVACAINE-EPINEPHRINE (PF) 0.25% -1:200000 IJ SOLN
INTRAMUSCULAR | Status: AC
  Filled 2024-01-05: qty 30

## 2024-01-05 MED ORDER — EPHEDRINE SULFATE-NACL 50-0.9 MG/10ML-% IV SOSY
PREFILLED_SYRINGE | INTRAVENOUS | Status: DC | PRN
  Administered 2024-01-05: 5 mg via INTRAVENOUS

## 2024-01-05 MED ORDER — LABETALOL HCL 5 MG/ML IV SOLN
INTRAVENOUS | Status: DC | PRN
  Administered 2024-01-05: 5 mg via INTRAVENOUS

## 2024-01-05 MED ORDER — ONDANSETRON HCL 4 MG/2ML IJ SOLN
INTRAMUSCULAR | Status: DC | PRN
  Administered 2024-01-05: 4 mg via INTRAVENOUS

## 2024-01-05 MED ORDER — SCOPOLAMINE 1 MG/3DAYS TD PT72
1.0000 | MEDICATED_PATCH | TRANSDERMAL | Status: DC
  Administered 2024-01-05: 1 via TRANSDERMAL
  Filled 2024-01-05: qty 1

## 2024-01-05 MED ORDER — PROPOFOL 10 MG/ML IV BOLUS
INTRAVENOUS | Status: DC | PRN
Start: 2024-01-05 — End: 2024-01-05
  Administered 2024-01-05: 100 mg via INTRAVENOUS
  Administered 2024-01-05: 60 mg via INTRAVENOUS
  Administered 2024-01-05: 40 mg via INTRAVENOUS

## 2024-01-05 MED ORDER — SUGAMMADEX SODIUM 200 MG/2ML IV SOLN
INTRAVENOUS | Status: DC | PRN
  Administered 2024-01-05: 200 mg via INTRAVENOUS
  Administered 2024-01-05: 100 mg via INTRAVENOUS

## 2024-01-05 MED ORDER — ONDANSETRON HCL 4 MG/2ML IJ SOLN
INTRAMUSCULAR | Status: AC
  Filled 2024-01-05: qty 2

## 2024-01-05 MED ORDER — ORAL CARE MOUTH RINSE: 15.0000 mL | Freq: Once | OROMUCOSAL | Status: AC

## 2024-01-05 MED ORDER — MIDAZOLAM HCL 2 MG/2ML IJ SOLN
INTRAMUSCULAR | Status: AC
  Filled 2024-01-05: qty 2

## 2024-01-05 MED ORDER — LACTATED RINGERS IV SOLN: INTRAVENOUS | Status: DC

## 2024-01-05 MED ORDER — CEFAZOLIN SODIUM-DEXTROSE 2-4 GM/100ML-% IV SOLN
2.0000 g | INTRAVENOUS | Status: AC
  Administered 2024-01-05: 2 g via INTRAVENOUS
  Filled 2024-01-05: qty 100

## 2024-01-05 MED ORDER — 0.9 % SODIUM CHLORIDE (POUR BTL) OPTIME
TOPICAL | Status: DC | PRN
  Administered 2024-01-05: 1000 mL

## 2024-01-05 MED ORDER — FENTANYL CITRATE (PF) 250 MCG/5ML IJ SOLN
INTRAMUSCULAR | Status: DC | PRN
  Administered 2024-01-05: 100 ug via INTRAVENOUS

## 2024-01-05 SURGICAL SUPPLY — 32 items
BAG COUNTER SPONGE SURGICOUNT (BAG) ×1 IMPLANT
CANISTER SUCTION 3000ML PPV (SUCTIONS) IMPLANT
COVER SURGICAL LIGHT HANDLE (MISCELLANEOUS) ×1 IMPLANT
DERMABOND ADVANCED .7 DNX12 (GAUZE/BANDAGES/DRESSINGS) ×1 IMPLANT
DISSECTOR BLUNT TIP ENDO 5MM (MISCELLANEOUS) IMPLANT
ELECTRODE REM PT RTRN 9FT ADLT (ELECTROSURGICAL) ×1 IMPLANT
ENDOLOOP SUT PDS II 0 18 (SUTURE) IMPLANT
GLOVE BIO SURGEON STRL SZ7.5 (GLOVE) ×2 IMPLANT
GOWN STRL REUS W/ TWL LRG LVL3 (GOWN DISPOSABLE) ×2 IMPLANT
GOWN STRL REUS W/ TWL XL LVL3 (GOWN DISPOSABLE) ×1 IMPLANT
IRRIGATION SUCT STRKRFLW 2 WTP (MISCELLANEOUS) IMPLANT
KIT BASIN OR (CUSTOM PROCEDURE TRAY) ×1 IMPLANT
KIT TURNOVER KIT B (KITS) ×1 IMPLANT
MESH 3DMAX 5X7 LT XLRG (Mesh General) IMPLANT
NDL INSUFFLATION 14GA 120MM (NEEDLE) IMPLANT
PAD ARMBOARD POSITIONER FOAM (MISCELLANEOUS) ×2 IMPLANT
RELOAD STAPLE 4.0 BLU F/HERNIA (INSTRUMENTS) IMPLANT
SCISSORS LAP 5X35 DISP (ENDOMECHANICALS) ×1 IMPLANT
SET TUBE SMOKE EVAC HIGH FLOW (TUBING) ×1 IMPLANT
SOLN 0.9% NACL POUR BTL 1000ML (IV SOLUTION) ×1 IMPLANT
SOLN STERILE WATER BTL 1000 ML (IV SOLUTION) ×1 IMPLANT
STAPLER HERNIA 12 8.5 360D (INSTRUMENTS) IMPLANT
SUT MNCRL AB 4-0 PS2 18 (SUTURE) ×1 IMPLANT
SUT VIC AB 1 CT1 27XBRD ANBCTR (SUTURE) IMPLANT
SUT VICRYL 0 UR6 27IN ABS (SUTURE) IMPLANT
TOWEL GREEN STERILE (TOWEL DISPOSABLE) ×1 IMPLANT
TOWEL GREEN STERILE FF (TOWEL DISPOSABLE) ×1 IMPLANT
TRAY LAPAROSCOPIC MC (CUSTOM PROCEDURE TRAY) ×1 IMPLANT
TROCAR OPTICAL SHORT 5MM (TROCAR) ×1 IMPLANT
TROCAR OPTICAL SLV SHORT 5MM (TROCAR) ×1 IMPLANT
TROCAR Z THREAD OPTICAL 12X100 (TROCAR) ×1 IMPLANT
WARMER LAPAROSCOPE (MISCELLANEOUS) ×1 IMPLANT

## 2024-01-05 NOTE — Anesthesia Procedure Notes (Signed)
 Procedure Name: Intubation Date/Time: 01/05/2024 7:59 AM  Performed by: Erlene Powell POUR, CRNAPre-anesthesia Checklist: Patient identified, Emergency Drugs available, Suction available and Patient being monitored Patient Re-evaluated:Patient Re-evaluated prior to induction Oxygen Delivery Method: Circle system utilized Preoxygenation: Pre-oxygenation with 100% oxygen Induction Type: IV induction Ventilation: Mask ventilation without difficulty Laryngoscope Size: Miller and 2 Grade View: Grade I Tube type: Oral Tube size: 7.5 mm Number of attempts: 1 Airway Equipment and Method: Stylet Placement Confirmation: ETT inserted through vocal cords under direct vision, positive ETCO2 and breath sounds checked- equal and bilateral Secured at: 22 cm Tube secured with: Tape Dental Injury: Teeth and Oropharynx as per pre-operative assessment

## 2024-01-05 NOTE — Interval H&P Note (Signed)
 History and Physical Interval Note:  01/05/2024 6:54 AM  Shaun Ball  has presented today for surgery, with the diagnosis of LEFT INGUINAL HERNIA.  The various methods of treatment have been discussed with the patient and family. After consideration of risks, benefits and other options for treatment, the patient has consented to  Procedure(s): REPAIR, HERNIA, INGUINAL, LAPAROSCOPIC (Left) as a surgical intervention.  The patient's history has been reviewed, patient examined, no change in status, stable for surgery.  I have reviewed the patient's chart and labs.  Questions were answered to the patient's satisfaction.     Bracha Frankowski

## 2024-01-05 NOTE — Transfer of Care (Signed)
 Immediate Anesthesia Transfer of Care Note  Patient: Shaun Ball  Procedure(s) Performed: REPAIR, HERNIA, INGUINAL, LAPAROSCOPIC (Left)  Patient Location: PACU  Anesthesia Type:General  Level of Consciousness: awake, alert , oriented, patient cooperative, and responds to stimulation  Airway & Oxygen Therapy: Patient Spontanous Breathing  Post-op Assessment: Report given to RN and Post -op Vital signs reviewed and stable  Post vital signs: Reviewed and stable  Last Vitals:  Vitals Value Taken Time  BP 124/60   Temp    Pulse 81 01/05/24 08:28  Resp 15 01/05/24 08:28  SpO2 96 % 01/05/24 08:28  Vitals shown include unfiled device data.  Last Pain:  Vitals:   01/05/24 0615  TempSrc:   PainSc: 0-No pain         Complications: No notable events documented.

## 2024-01-05 NOTE — Discharge Instructions (Signed)

## 2024-01-05 NOTE — Anesthesia Postprocedure Evaluation (Signed)
 Anesthesia Post Note  Patient: Shaun Ball  Procedure(s) Performed: REPAIR, HERNIA, INGUINAL, LAPAROSCOPIC (Left)     Patient location during evaluation: PACU Anesthesia Type: General Level of consciousness: awake and alert Pain management: pain level controlled Vital Signs Assessment: post-procedure vital signs reviewed and stable Respiratory status: spontaneous breathing, nonlabored ventilation, respiratory function stable and patient connected to nasal cannula oxygen Cardiovascular status: blood pressure returned to baseline and stable Postop Assessment: no apparent nausea or vomiting Anesthetic complications: no   No notable events documented.  Last Vitals:  Vitals:   01/05/24 0845 01/05/24 0900  BP: 131/73 131/68  Pulse: 62 61  Resp: 13 13  Temp:  36.5 C  SpO2: 98% 98%    Last Pain:  Vitals:   01/05/24 0900  TempSrc:   PainSc: 0-No pain                 Franky JONETTA Bald

## 2024-01-05 NOTE — Op Note (Addendum)
 01/05/2024  8:14 AM  PATIENT:  Shaun Ball  73 y.o. male  PRE-OPERATIVE DIAGNOSIS:  LEFT INGUINAL HERNIA  POST-OPERATIVE DIAGNOSIS:  LEFT DIRECT INGUINAL HERNIA  PROCEDURE:  Procedure(s): REPAIR, HERNIA, INGUINAL, LAPAROSCOPIC (Left)  SURGEON:  Surgeons and Role:    DEWAINE Rubin Calamity, MD - Primary   ASSISTANTS: Dr. Margretta Brash PGY-3  ANESTHESIA:   local and general  EBL:  minimal   BLOOD ADMINISTERED:none  DRAINS: none   LOCAL MEDICATIONS USED:  BUPIVICAINE   SPECIMEN:  No Specimen  DISPOSITION OF SPECIMEN:  N/A  COUNTS:  YES  TOURNIQUET:  * No tourniquets in log *  DICTATION: .Dragon Dictation  Counts: reported as correct x 2  Findings:  The patient had a medium left direct hernia  Indications for procedure:  The patient is a 73 year old male with a left hernia for several months. Patient complained of symptomatology to his left inguinal area. The patient was taken back for elective inguinal hernia repair.  Details of the procedure: The patient was taken back to the operating room. The patient was placed in supine position with bilateral SCDs in place.  The patient was prepped and draped in the usual sterile fashion.  After appropriate anitbiotics were confirmed, a time-out was confirmed and all facts were verified.  0.25% Marcaine  was used to infiltrate the umbilical area. A 11-blade was used to cut down the skin and blunt dissection was used to get the anterior fashion.  The anterior fascia was incised approximately 1 cm and the muscles were retracted laterally. Blunt dissection was then used to create a space in the preperitoneal area. At this time a 10 mm camera was then introduced into the space and advanced the pubic tubercle and a 12 mm trocar was placed over this and insufflation was started.  At this time and space was created from medial to laterally the preperitoneal space.  Cooper's ligament was initially cleaned off.  The hernia was identified in the  direct space. Dissection of the hernia sac was undertaken and it was fully reduced.  The transversalis fascia retracted spontaneously.    The spermatic cord and the vas deferens was identified and protected in all parts of the case. Cremasterics were cleaned off from the cord.   The peritoneum was taken down to approximately the umbilicus a Bard 3D Max mesh, size: Nickola, was  introduced into the preperitoneal space.  The mesh was brought over to cover the direct and indirect hernia spaces.  This was anchored into place and secured to Cooper's ligament with 4.65mm staples from a [place] hernia stapler. It was anchored to the anterior abdominal wall with 4.8 mm staples. The peritoneum was seen lying posterior to the mesh. There was no staples placed laterally. The insufflation was evacuated and the peritoneum was seen posterior to the mesh. The trochars were removed. The anterior fascia was reapproximated using #1 Vicryl on a UR- 6.  Intra-abdominal air was evacuated and the Veress needle removed. The skin was reapproximated using 4-0 Monocryl subcuticular fashion.  The skin was dressed with Dermabond.  The patient was awakened from general anesthesia and taken to recovery in stable condition.    I was personally present during the key and critical portions of this procedure and immediately available throughout the entire procedure, as documented in my operative note.   PLAN OF CARE: Discharge to home after PACU  PATIENT DISPOSITION:  PACU - hemodynamically stable.   Delay start of Pharmacological VTE agent (>24hrs) due to  surgical blood loss or risk of bleeding: not applicable

## 2024-01-06 ENCOUNTER — Encounter (HOSPITAL_COMMUNITY): Payer: Self-pay | Admitting: General Surgery
